# Patient Record
Sex: Female | Born: 1980 | Race: Black or African American | Hispanic: No | Marital: Single | Smoking: Current some day smoker
Health system: Southern US, Community
[De-identification: ages and names within clinical notes are randomized; demographics above are authoritative.]

## PROBLEM LIST (undated history)

## (undated) DIAGNOSIS — C801 Malignant (primary) neoplasm, unspecified: Secondary | ICD-10-CM

## (undated) DIAGNOSIS — R569 Unspecified convulsions: Secondary | ICD-10-CM

---

## 2016-09-18 DIAGNOSIS — C719 Malignant neoplasm of brain, unspecified: Secondary | ICD-10-CM | POA: Diagnosis present

## 2018-11-20 DIAGNOSIS — F411 Generalized anxiety disorder: Secondary | ICD-10-CM | POA: Diagnosis present

## 2020-07-17 ENCOUNTER — Encounter (HOSPITAL_COMMUNITY): Payer: Self-pay

## 2020-07-17 ENCOUNTER — Other Ambulatory Visit: Payer: Self-pay

## 2020-07-17 ENCOUNTER — Observation Stay (HOSPITAL_COMMUNITY)
Admission: EM | Admit: 2020-07-17 | Discharge: 2020-07-18 | Disposition: A | Discharge status: Home / Self Care | Payer: Medicaid Other | Attending: Family Medicine | Admitting: Family Medicine

## 2020-07-17 ENCOUNTER — Emergency Department (HOSPITAL_COMMUNITY): Payer: Medicaid Other

## 2020-07-17 DIAGNOSIS — R519 Headache, unspecified: Secondary | ICD-10-CM | POA: Diagnosis not present

## 2020-07-17 DIAGNOSIS — R739 Hyperglycemia, unspecified: Secondary | ICD-10-CM | POA: Insufficient documentation

## 2020-07-17 DIAGNOSIS — E876 Hypokalemia: Secondary | ICD-10-CM | POA: Diagnosis not present

## 2020-07-17 DIAGNOSIS — G936 Cerebral edema: Secondary | ICD-10-CM

## 2020-07-17 DIAGNOSIS — Z20822 Contact with and (suspected) exposure to covid-19: Secondary | ICD-10-CM | POA: Diagnosis not present

## 2020-07-17 DIAGNOSIS — C719 Malignant neoplasm of brain, unspecified: Secondary | ICD-10-CM | POA: Diagnosis present

## 2020-07-17 DIAGNOSIS — F411 Generalized anxiety disorder: Secondary | ICD-10-CM | POA: Diagnosis present

## 2020-07-17 HISTORY — DX: Malignant (primary) neoplasm, unspecified: C80.1

## 2020-07-17 LAB — BASIC METABOLIC PANEL
Anion gap: 8 (ref 5–15)
BUN: 8 mg/dL (ref 6–20)
CO2: 25 mmol/L (ref 22–32)
Calcium: 9.6 mg/dL (ref 8.9–10.3)
Chloride: 103 mmol/L (ref 98–111)
Creatinine, Ser: 0.85 mg/dL (ref 0.44–1.00)
GFR, Estimated: >60 mL/min (ref >60)
Glucose, Bld: 109 mg/dL — ABNORMAL HIGH (ref 70–99)
Potassium: 3.3 mmol/L — ABNORMAL LOW (ref 3.5–5.1)
Sodium: 136 mmol/L (ref 135–145)

## 2020-07-17 LAB — CBC WITH DIFFERENTIAL/PLATELET
Abs Immature Granulocytes: 0.01 10*3/uL (ref 0.00–0.07)
Basophils Absolute: 0 10*3/uL (ref 0.0–0.1)
Basophils Relative: 0 %
Eosinophils Absolute: 0 10*3/uL (ref 0.0–0.5)
Eosinophils Relative: 0 %
HCT: 41.5 % (ref 36.0–46.0)
Hemoglobin: 13.7 g/dL (ref 12.0–15.0)
Immature Granulocytes: 0 %
Lymphocytes Relative: 10 %
Lymphs Abs: 0.9 10*3/uL (ref 0.7–4.0)
MCH: 31.6 pg (ref 26.0–34.0)
MCHC: 33 g/dL (ref 30.0–36.0)
MCV: 95.6 fL (ref 80.0–100.0)
Monocytes Absolute: 0.3 10*3/uL (ref 0.1–1.0)
Monocytes Relative: 4 %
Neutro Abs: 7.2 10*3/uL (ref 1.7–7.7)
Neutrophils Relative %: 86 %
Platelets: 171 10*3/uL (ref 150–400)
RBC: 4.34 MIL/uL (ref 3.87–5.11)
RDW: 12.2 % (ref 11.5–15.5)
WBC: 8.4 10*3/uL (ref 4.0–10.5)
nRBC: 0 % (ref 0.0–0.2)

## 2020-07-17 LAB — RESP PANEL BY RT-PCR (FLU A&B, COVID) ARPGX2
Influenza A by PCR: NEGATIVE
Influenza B by PCR: NEGATIVE
SARS Coronavirus 2 by RT PCR: NEGATIVE

## 2020-07-17 LAB — MAGNESIUM: Magnesium: 1.8 mg/dL (ref 1.7–2.4)

## 2020-07-17 LAB — PREGNANCY, URINE: Preg Test, Ur: NEGATIVE

## 2020-07-17 MED ORDER — POLYETHYLENE GLYCOL 3350 17 G PO PACK
17.0000 g | PACK | Freq: Every day | ORAL | Status: DC
  Administered 2020-07-17: 17 g via ORAL
  Filled 2020-07-17: qty 1

## 2020-07-17 MED ORDER — HYDROMORPHONE HCL 1 MG/ML IJ SOLN
1.0000 mg | Freq: Once | INTRAMUSCULAR | Status: AC
  Administered 2020-07-17: 1 mg via INTRAVENOUS
  Filled 2020-07-17: qty 1

## 2020-07-17 MED ORDER — ONDANSETRON HCL 4 MG/2ML IJ SOLN
4.0000 mg | Freq: Once | INTRAMUSCULAR | Status: AC
  Administered 2020-07-17: 4 mg via INTRAVENOUS
  Filled 2020-07-17: qty 2

## 2020-07-17 MED ORDER — SODIUM CHLORIDE 0.9% FLUSH: 3.0000 mL | INTRAVENOUS | Status: DC | PRN

## 2020-07-17 MED ORDER — ONDANSETRON HCL 4 MG PO TABS: 4.0000 mg | ORAL_TABLET | Freq: Four times a day (QID) | ORAL | Status: DC | PRN

## 2020-07-17 MED ORDER — ACETAMINOPHEN 325 MG PO TABS
650.0000 mg | ORAL_TABLET | Freq: Four times a day (QID) | ORAL | Status: DC | PRN
  Administered 2020-07-17: 650 mg via ORAL
  Filled 2020-07-17: qty 2

## 2020-07-17 MED ORDER — PANTOPRAZOLE SODIUM 40 MG IV SOLR: 40.0000 mg | Freq: Once | INTRAVENOUS | Status: DC

## 2020-07-17 MED ORDER — POTASSIUM CHLORIDE 20 MEQ PO PACK
40.0000 meq | PACK | Freq: Once | ORAL | Status: AC
  Administered 2020-07-17: 40 meq via ORAL
  Filled 2020-07-17: qty 2

## 2020-07-17 MED ORDER — SODIUM CHLORIDE 0.9 % IV SOLN: 250.0000 mL | INTRAVENOUS | Status: DC | PRN

## 2020-07-17 MED ORDER — SODIUM CHLORIDE 0.9% FLUSH
3.0000 mL | Freq: Two times a day (BID) | INTRAVENOUS | Status: DC
  Administered 2020-07-17 (×2): 3 mL via INTRAVENOUS

## 2020-07-17 MED ORDER — HYDROMORPHONE HCL 2 MG/ML IJ SOLN
2.0000 mg | INTRAMUSCULAR | Status: DC | PRN
  Administered 2020-07-17: 2 mg via INTRAVENOUS
  Filled 2020-07-17: qty 1

## 2020-07-17 MED ORDER — ENOXAPARIN SODIUM 40 MG/0.4ML IJ SOSY
40.0000 mg | PREFILLED_SYRINGE | Freq: Every day | INTRAMUSCULAR | Status: DC
  Administered 2020-07-17: 40 mg via SUBCUTANEOUS
  Filled 2020-07-17: qty 0.4

## 2020-07-17 MED ORDER — PANTOPRAZOLE SODIUM 40 MG PO TBEC
40.0000 mg | DELAYED_RELEASE_TABLET | Freq: Every day | ORAL | Status: DC
  Administered 2020-07-17: 40 mg via ORAL
  Filled 2020-07-17: qty 1

## 2020-07-17 MED ORDER — ONDANSETRON HCL 4 MG/2ML IJ SOLN
4.0000 mg | Freq: Four times a day (QID) | INTRAMUSCULAR | Status: DC | PRN
  Administered 2020-07-17: 4 mg via INTRAVENOUS
  Filled 2020-07-17: qty 2

## 2020-07-17 MED ORDER — DEXAMETHASONE SODIUM PHOSPHATE 10 MG/ML IJ SOLN
10.0000 mg | Freq: Once | INTRAMUSCULAR | Status: AC
  Administered 2020-07-17: 10 mg via INTRAVENOUS
  Filled 2020-07-17: qty 1

## 2020-07-17 MED ORDER — METOCLOPRAMIDE HCL 5 MG/ML IJ SOLN: 10.0000 mg | Freq: Four times a day (QID) | INTRAMUSCULAR | Status: DC | PRN

## 2020-07-17 MED ORDER — MORPHINE SULFATE (PF) 4 MG/ML IV SOLN
4.0000 mg | Freq: Once | INTRAVENOUS | Status: AC
  Administered 2020-07-17: 4 mg via INTRAVENOUS
  Filled 2020-07-17: qty 1

## 2020-07-17 MED ORDER — DEXAMETHASONE 4 MG PO TABS
4.0000 mg | ORAL_TABLET | Freq: Three times a day (TID) | ORAL | Status: DC
  Administered 2020-07-17 – 2020-07-18 (×3): 4 mg via ORAL
  Filled 2020-07-17 (×3): qty 1

## 2020-07-17 NOTE — ED Provider Notes (Signed)
[facility] DEPT Provider Note   CSN: [id number] Arrival date & time: 07/17/20  0630     History Chief Complaint  Patient presents with   Headache    Paula Farmer is a 40 y.o. female.  Pt presents to the ED today with a headache and n/v.  Pt has a recurrent anaplastic astrocytoma.  She had a brain biopsy on 5/25.  NS did not recommend debulking surgery.  She followed up with her oncologist (Dr. Maylon Peppers) on 6/16 and was told to start on Temozolomide, but the pharmacy has not filled it yet.  She was prescribed vicodin and zofran, but she has not taken any today.  Pain is more on the right side of her head.  She said she also feels like she can't walk as well as she used to.  She has been taking her decadron.      Past Medical History:  Diagnosis Date   Cancer [facility])     Patient Active Problem List   Diagnosis Date Noted   Headache 07/17/2020   Intractable headache 07/17/2020   GAD (generalized anxiety disorder) 11/20/2018   Anaplastic astrocytoma ([place]) 09/18/2016     History reviewed. No pertinent surgical history.   OB History   No obstetric history on file.     No family history on file.  Social History   Tobacco Use   Smoking status: Unknown  Substance Use Topics   Drug use: Not Currently    Home Medications Prior to Admission medications   Not on File    Allergies    Patient has no known allergies.  Review of Systems   Review of Systems  Gastrointestinal:  Positive for nausea and vomiting.  Neurological:  Positive for headaches.  All other systems reviewed and are negative.  Physical Exam Updated Vital Signs BP 114/73   Pulse 61   Temp 98.3 F (36.8 C) (Oral)   Resp 16   Ht 5' (1.524 m)   Wt 49.9 kg   LMP 07/01/2020   SpO2 100%   BMI 21.48 kg/m   Physical Exam Vitals and nursing note reviewed.  Constitutional:      General: She is in acute distress.  HENT:     Head: Normocephalic and atraumatic.      Mouth/Throat:     Mouth: Mucous membranes are moist.     Pharynx: Oropharynx is clear.  Eyes:     Extraocular Movements: Extraocular movements intact.     Pupils: Pupils are equal, round, and reactive to light.  Cardiovascular:     Rate and Rhythm: Normal rate and regular rhythm.  Pulmonary:     Effort: Pulmonary effort is normal.     Breath sounds: Normal breath sounds.  Abdominal:     General: Bowel sounds are normal.     Palpations: Abdomen is soft.  Musculoskeletal:        General: Normal range of motion.     Cervical back: Normal range of motion and neck supple.  Skin:    General: Skin is warm.     Capillary Refill: Capillary refill takes less than 2 seconds.  Neurological:     Mental Status: She is alert and oriented to person, place, and time.     Comments: It is difficult to get a good exam because pt is rocking and hiding under a blanket due to the pain.  Psychiatric:        Mood and Affect: Mood normal.    ED  Results / Procedures / Treatments   Labs (all labs ordered are listed, but only abnormal results are displayed) Labs Reviewed  BASIC METABOLIC PANEL - Abnormal; Notable for the following components:      Result Value   Potassium 3.3 (*)    Glucose, Bld 109 (*)    All other components within normal limits  RESP PANEL BY RT-PCR (FLU A&B, COVID) [id number]  CBC WITH DIFFERENTIAL/PLATELET  PREGNANCY, URINE  MAGNESIUM    EKG None  Radiology CT Head Wo Contrast  Result Date: 07/17/2020 CLINICAL DATA:  History of anaplastic astrocytoma.  Headache. EXAM: CT HEAD WITHOUT CONTRAST TECHNIQUE: Contiguous axial images were obtained from the base of the skull through the vertex without intravenous contrast. COMPARISON:  None. FINDINGS: Brain: Large area of tumoral edema involving the right temporal and parietal lobes. There is mild mass effect on the right lateral ventricle with approximately 5 mm of right-to-left shift. No evidence of acute hemorrhage or downward  transtentorial herniation. The temporal horn of the right lateral ventricle is slightly enlarged. No extra-axial fluid collections. Brainstem and cerebellum are grossly normal. Vascular: No hyperdense vessel or unexpected calcification. Skull: No bone lesions or fractures. Sinuses/Orbits: The paranasal sinuses and mastoid air cells are clear. Other: No scalp lesions or hematoma. IMPRESSION: 1. Large area of tumoral edema involving the right temporal and parietal lobes with mild mass effect on the right lateral ventricle and approximately 5 mm of right-to-left shift. 2. No evidence of acute hemorrhage or downward transtentorial herniation. Electronically Signed   By: Marijo Sanes M.D.   On: 07/17/2020 08:34    Procedures Procedures   Medications Ordered in ED Medications  enoxaparin (LOVENOX) injection 40 mg (has no administration in time range)  sodium chloride flush (NS) 0.9 % injection 3 mL (has no administration in time range)  sodium chloride flush (NS) 0.9 % injection 3 mL (has no administration in time range)  0.9 %  sodium chloride infusion (has no administration in time range)  ondansetron (ZOFRAN) tablet 4 mg (has no administration in time range)    Or  ondansetron (ZOFRAN) injection 4 mg (has no administration in time range)  HYDROmorphone (DILAUDID) injection 2 mg (has no administration in time range)  pantoprazole (PROTONIX) EC tablet 40 mg (has no administration in time range)  potassium chloride (KLOR-CON) packet 40 mEq (has no administration in time range)  dexamethasone (DECADRON) tablet 4 mg (has no administration in time range)  polyethylene glycol (MIRALAX / GLYCOLAX) packet 17 g (has no administration in time range)  morphine 4 MG/ML injection 4 mg (4 mg Intravenous Given 07/17/20 0739)  ondansetron (ZOFRAN) injection 4 mg (4 mg Intravenous Given 07/17/20 0739)  HYDROmorphone (DILAUDID) injection 1 mg (1 mg Intravenous Given 07/17/20 0902)  dexamethasone (DECADRON) injection  10 mg (10 mg Intravenous Given 07/17/20 0934)    ED Course  I have reviewed the triage vital signs and the nursing notes.  Pertinent labs & imaging results that were available during my care of the patient were reviewed by me and considered in my medical decision making (see chart for details).    MDM Rules/Calculators/A&P                          Pt is still in a lot of pain after the morphine.  She was given dilaudid which has helped.  Pt is more comfortable.  She was d/w Dr. Maylon Peppers.  He recommends decadron 10 mg IV now  then decadron 4 mg TID  + PPI.  She does not need to go to [facility] for this.  He will arrange follow up for her in his office after hospital d/c.  He is going out of town this afternoon, but his partner (also neuro- oncology) will be available if her condition changes.  Pt d/w Dr. Si Raider ([facility]) for admission.  Final Clinical Impression(s) / ED Diagnoses Final diagnoses:  Vasogenic edema (HCC)  Anaplastic astrocytoma ([place])  Acute intractable headache, unspecified headache type    Rx / DC Orders ED Discharge Orders     None        Isla Pence, MD 07/17/20 1017

## 2020-07-17 NOTE — ED Triage Notes (Signed)
Pt also has nausea and vomiting at this time.

## 2020-07-17 NOTE — H&P (Signed)
History and Physical    Paula Farmer VEH:[id number] DOB: 1980/03/27 DOA: 07/17/2020  PCP: System, Provider Not In  Patient coming from: home   Chief Complaint: headache  HPI: Paula Farmer is a 40 y.o. female with medical history significant for anaplastic astrocytoma who presents with the above.  Hx of this malignancy treated with chemo and radiation in 2018. Recently began experiencing headache and vision change. Imaging shows recurrence or progression of disease. Underwent biopsy last month at [facility] (receives all her care there). Plan to start temozolamide but hasn't received it yet.   She was awoken from sleep early this morning with severe right sided headache. Also with nausea. No focal weakness/numbness.  ED Course:   Imaging shows large area of tumoral edema in right temporal and parietal lobes. EDP consulted patient's [facility] oncologist dr. Maylon Peppers who advised decadron IV and then 4 mg tid with plan for outpt f/u in his office which he will arrange.  Review of Systems: As per HPI otherwise 10 point review of systems negative.    Past Medical History:  Diagnosis Date   Cancer [facility])     History reviewed. No pertinent surgical history.   reports previous drug use. No history on file for tobacco use and alcohol use.  No Known Allergies  No family history on file.  Prior to Admission medications   Not on File    Physical Exam: Vitals:   07/17/20 0705 07/17/20 0730 07/17/20 0835 07/17/20 0900  BP: 115/75  124/63 114/73  Pulse: 62  83 61  Resp:   18 16  Temp:  98.3 F (36.8 C)    TempSrc:  Oral    SpO2: 95%  100% 100%  Weight:      Height:        Constitutional: appears in pain Head: Atraumatic Eyes: Conjunctiva clear ENM: Moist mucous membranes.   Neck: Supple Respiratory: Clear to auscultation bilaterally, no wheezing/rales/rhonchi. Normal respiratory effort. No accessory muscle use. . Cardiovascular: Regular rate and rhythm. No  murmurs/rubs/gallops. Abdomen: Non-tender, non-distended. No masses. No rebound or guarding. Positive bowel sounds. Musculoskeletal: No joint deformity upper and lower extremities. Normal ROM, no contractures. Normal muscle tone.  Skin: No rashes, lesions, or ulcers.  Extremities: No peripheral edema. Palpable peripheral pulses. Neurologic: Alert, moving all 4 extremities. Psychiatric: Normal insight and judgement.   Labs on Admission: I have personally reviewed following labs and imaging studies  CBC: Recent Labs  Lab 07/17/20 0744  WBC 8.4  NEUTROABS 7.2  HGB 13.7  HCT 41.5  MCV 95.6  PLT 836   Basic Metabolic Panel: Recent Labs  Lab 07/17/20 0744  NA 136  K 3.3*  CL 103  CO2 25  GLUCOSE 109*  BUN 8  CREATININE 0.85  CALCIUM 9.6   GFR: Estimated Creatinine Clearance: 64.5 mL/min (by C-G formula based on SCr of 0.85 mg/dL). Liver Function Tests: No results for input(s): AST, ALT, ALKPHOS, BILITOT, PROT, ALBUMIN in the last 168 hours. No results for input(s): LIPASE, AMYLASE in the last 168 hours. No results for input(s): AMMONIA in the last 168 hours. Coagulation Profile: No results for input(s): INR, PROTIME in the last 168 hours. Cardiac Enzymes: No results for input(s): CKTOTAL, CKMB, CKMBINDEX, TROPONINI in the last 168 hours. BNP (last 3 results) No results for input(s): PROBNP in the last 8760 hours. HbA1C: No results for input(s): HGBA1C in the last 72 hours. CBG: No results for input(s): GLUCAP in the last 168 hours. Lipid Profile: No results for  input(s): CHOL, HDL, LDLCALC, TRIG, CHOLHDL, LDLDIRECT in the last 72 hours. Thyroid Function Tests: No results for input(s): TSH, T4TOTAL, FREET4, T3FREE, THYROIDAB in the last 72 hours. Anemia Panel: No results for input(s): VITAMINB12, FOLATE, FERRITIN, TIBC, IRON, RETICCTPCT in the last 72 hours. Urine analysis: No results found for: COLORURINE, APPEARANCEUR, [place], [place], GLUCOSEU, HGBUR,  BILIRUBINUR, KETONESUR, PROTEINUR, UROBILINOGEN, NITRITE, LEUKOCYTESUR  Radiological Exams on Admission: CT Head Wo Contrast  Result Date: 07/17/2020 CLINICAL DATA:  History of anaplastic astrocytoma.  Headache. EXAM: CT HEAD WITHOUT CONTRAST TECHNIQUE: Contiguous axial images were obtained from the base of the skull through the vertex without intravenous contrast. COMPARISON:  None. FINDINGS: Brain: Large area of tumoral edema involving the right temporal and parietal lobes. There is mild mass effect on the right lateral ventricle with approximately 5 mm of right-to-left shift. No evidence of acute hemorrhage or downward transtentorial herniation. The temporal horn of the right lateral ventricle is slightly enlarged. No extra-axial fluid collections. Brainstem and cerebellum are grossly normal. Vascular: No hyperdense vessel or unexpected calcification. Skull: No bone lesions or fractures. Sinuses/Orbits: The paranasal sinuses and mastoid air cells are clear. Other: No scalp lesions or hematoma. IMPRESSION: 1. Large area of tumoral edema involving the right temporal and parietal lobes with mild mass effect on the right lateral ventricle and approximately 5 mm of right-to-left shift. 2. No evidence of acute hemorrhage or downward transtentorial herniation. Electronically Signed   By: Marijo Sanes M.D.   On: 07/17/2020 08:34      Assessment/Plan Active Problems:   Anaplastic astrocytoma (HCC)   GAD (generalized anxiety disorder)   Headache   Intractable headache   # Intractable headache # Anaplastic astrocytoma # Vasogenic edema Recent dx of recurrence or progression of known astrocytoma. Now with one day of severe debilitating headache. Dr. Maylon Peppers of [facility] advises glucocorticoids. No herniation seen on CT - has received dexamethasone 10 mg IV, will order 4 mg po tid - start PPI - zofran for nausea - dilaudid for pain (miralax ordered) - pt/ot consult - will be ready to d/c w/ close  oncology f/u when pain controlled and able to ambulate safely - seizure precautioins  # Hypokalemia Mild, 3.3, likely 2/2 nausea/vomiting - f/u mg - 40 meq oral ordered  DVT prophylaxis: lovenox Code Status: full  Family Communication: none @ bedside  Consults called: none   Level of care: Med-Surg     Desma Maxim MD [facility] Pager [phone]  If 7PM-7AM, please contact night-coverage [email or web address] Password [facility]  07/17/2020, 10:11 AM

## 2020-07-17 NOTE — ED Notes (Signed)
Patient transported to CT 

## 2020-07-17 NOTE — Evaluation (Signed)
Occupational Therapy Evaluation Patient Details Name: Ashton Sabine MRN: [id number] DOB: December 19, 1980 Today's Date: 07/17/2020    History of Present Illness Latese Dufault is a 40 y.o. 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  Hx of this malignancy treated with chemo and radiation in 2018. Recently began experiencing headache and vision change. Imaging shows recurrence or progression of disease. Underwent biopsy last month at [facility] (receives all her care there).  She was awoken from sleep early this morning with severe right sided headache. Also with nausea. No focal weakness/numbness. Found to have Large area of tumoral edema involving the right temporal and  parietal lobes with mild mass effect on the right lateral ventricle  and approximately 5 mm of right-to-left shift.   Clinical Impression   Ms. Lumi Winslett is a 40 year old woman who presents with impaired balance, inattention, and possible left visual field cut. Also ntoed to have right shoulder impairments and pain from recent surgery. Patient able to perform ADLs and functional mobility though somewhat unsafely and unsteadily - though no over loss of balance was noted. Patient will benefit from skilled OT services while in hospital to improve deficits and learn compensatory strategies as needed in order to return home safely. Do not expect patient will need OT services at discharge. Recommend patient follow up with  ortho for potential rehab on shoulder and f/u with ophthalmologist for formal assessment of vision.     Follow Up Recommendations  No OT follow up    Equipment Recommendations  None recommended by OT    Recommendations for Other Services Other (comment) (Ophthalmogist)     Precautions / Restrictions Precautions Precautions: Fall Restrictions Weight Bearing Restrictions: No      Mobility Bed Mobility Overal bed mobility: Modified  Independent                  Transfers Overall transfer level: Modified independent                    Balance Overall balance assessment: Mild deficits observed, not formally tested                                         ADL either performed or assessed with clinical judgement   ADL Overall ADL's : Modified independent                                             Vision Patient Visual Report: Eye fatigue/eye pain/headache;Nausea/blurring vision with head movement (intermittent complaints. Patient had difficulty describing symptoms.) Vision Assessment?: Yes Eye Alignment: Within Functional Limits Ocular Range of Motion: Within Functional Limits Visual Fields: Left visual field deficit (questionable left visual field cut) Diplopia Assessment: Other (comment) (One report of double vision during assessment) Additional Comments: Visual assessment limited due to patient's inattention.     Perception     Praxis      Pertinent Vitals/Pain Pain Assessment: Faces Faces Pain Scale: Hurts little more Pain Location: R sided headache Pain Descriptors / Indicators: Grimacing;Sharp Pain Intervention(s): Monitored during session     Hand Dominance Right   Extremity/Trunk Assessment Upper Extremity Assessment Upper Extremity Assessment: RUE deficits/detail;LUE deficits/detail RUE Deficits / Details: WFL ROM, pain and decreased strength in shoulder from prior  surgery in January. Tends to shoulder hike and guard arm. RUE Sensation: WNL RUE Coordination:  (requires increased time and attention to perform finger to thumb correctly.) LUE Deficits / Details: WNL ROM/ 5/5 strength. LUE Sensation: WNL LUE Coordination: WNL   Lower Extremity Assessment Lower Extremity Assessment: Defer to PT evaluation   Cervical / Trunk Assessment Cervical / Trunk Assessment: Normal   Communication Communication Communication: No difficulties    Cognition Arousal/Alertness: Awake/alert Behavior During Therapy: WFL for tasks assessed/performed Overall Cognitive Status: Within Functional Limits for tasks assessed                                 General Comments: Easily distracted, short attention span, inattention.   General Comments  Can walk without device but patient reports improved confidence with walker. Walks in high guard without walker. Slightly usnteady but no overt loss of balance. Able to resist anterior nudges.    Exercises     Shoulder Instructions      Home Living Family/patient expects to be discharged to:: Private residence Living Arrangements: Children;Spouse/significant other Available Help at Discharge: Family                         Home Equipment: None          Prior Functioning/Environment Level of Independence: Independent        Comments: Has been independent. Walks the dog outside.        OT Problem List: Impaired balance (sitting and/or standing);Decreased knowledge of use of DME or AE;Impaired vision/perception;Pain      OT Treatment/Interventions: Balance training;Patient/family education;Therapeutic activities;Neuromuscular education;Visual/perceptual remediation/compensation;DME and/or AE instruction    OT Goals(Current goals can be found in the care plan section) Acute Rehab OT Goals Patient Stated Goal: go home soon OT Goal Formulation: With patient Time For Goal Achievement: 07/31/20 Potential to Achieve Goals: Good  OT Frequency: Min 2X/week   Barriers to D/C:            Co-evaluation              AM-PAC OT "6 Clicks" Daily Activity     Outcome Measure Help from another person eating meals?: None Help from another person taking care of personal grooming?: None Help from another person toileting, which includes using toliet, bedpan, or urinal?: None Help from another person bathing (including washing, rinsing, drying)?: None Help from  another person to put on and taking off regular upper body clothing?: None Help from another person to put on and taking off regular lower body clothing?: None 6 Click Score: 24   End of Session Equipment Utilized During Treatment: Rolling walker Nurse Communication: Mobility status  Activity Tolerance: Patient tolerated treatment well Patient left: in bed;with call bell/phone within reach;with family/visitor present  OT Visit Diagnosis: Unsteadiness on feet (R26.81);Other symptoms and signs involving the nervous system (R29.898)                Time: 1521-1550 OT Time Calculation (min): 29 min Charges:  OT General Charges $OT Visit: 1 Visit OT Evaluation $OT Eval Moderate Complexity: 1 Mod  Earnstine Meinders, OTR/L [place]  Office [phone] Pager: [place] 07/17/2020, 4:38 PM

## 2020-07-17 NOTE — ED Triage Notes (Signed)
Pt to ED by EMS from home with c/o headache. Pt has a history of brain cancer. Rates pain 10/10. Arrives A+O, VSS, Pt in obvious pain.

## 2020-07-17 NOTE — ED Notes (Signed)
Pt is unable to remain still enough to obtain vs

## 2020-07-17 NOTE — ED Notes (Signed)
Pt reports being woken up by a throbbing headache.  Hx of brain CA.  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  She recently moved to [place].

## 2020-07-17 NOTE — Progress Notes (Signed)
5 Teacher, English as a foreign language called to say one of our pts was on their floor. This RN went downstairs and found pt and significant other coming from outside. Pt instructed of no smoking policy and that patients are not to leave the unit. Pt verbalized understanding and escorted back to room.

## 2020-07-18 DIAGNOSIS — G936 Cerebral edema: Secondary | ICD-10-CM

## 2020-07-18 LAB — HIV ANTIBODY (ROUTINE TESTING W REFLEX): HIV Screen 4th Generation wRfx: NONREACTIVE

## 2020-07-18 LAB — BASIC METABOLIC PANEL
Anion gap: 10 (ref 5–15)
BUN: 8 mg/dL (ref 6–20)
CO2: 25 mmol/L (ref 22–32)
Calcium: 10.4 mg/dL — ABNORMAL HIGH (ref 8.9–10.3)
Chloride: 101 mmol/L (ref 98–111)
Creatinine, Ser: 0.84 mg/dL (ref 0.44–1.00)
GFR, Estimated: >60 mL/min (ref >60)
Glucose, Bld: 132 mg/dL — ABNORMAL HIGH (ref 70–99)
Potassium: 4 mmol/L (ref 3.5–5.1)
Sodium: 136 mmol/L (ref 135–145)

## 2020-07-18 MED ORDER — DEXAMETHASONE 4 MG PO TABS: 4.0000 mg | ORAL_TABLET | Freq: Three times a day (TID) | ORAL | 0 refills | Status: AC

## 2020-07-18 MED ORDER — PANTOPRAZOLE SODIUM 40 MG PO TBEC: 40.0000 mg | DELAYED_RELEASE_TABLET | Freq: Every day | ORAL | 0 refills | Status: DC

## 2020-07-18 NOTE — Progress Notes (Signed)
PT Cancellation Note  Patient Details Name: Paula Farmer MRN: [id number] DOB: Nov 25, 1980   Cancelled Treatment:    Reason Eval/Treat Not Completed: PT screened, no needs identified, will sign off. Patient ambulatyed x 200', talked and walked and  did not show signs of any deficits.    Paula Farmer 07/18/2020, 9:12 AM [place] Pager [phone] Office [phone]

## 2020-07-18 NOTE — Progress Notes (Signed)
Occupational Therapy Treatment Patient Details Name: Paula Farmer MRN: [id number] DOB: 03-26-1980 Today's Date: 07/18/2020    History of present illness Paula Farmer is a 40 y.o. 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  Hx of this malignancy treated with chemo and radiation in 2018. Recently began experiencing headache and vision change. Imaging shows recurrence or progression of disease. Underwent biopsy last month at [facility] (receives all her care there).  She was awoken from sleep early this morning with severe right sided headache. Also with nausea. No focal weakness/numbness. Found to have Large area of tumoral edema involving the right temporal and  parietal lobes with mild mass effect on the right lateral ventricle  and approximately 5 mm of right-to-left shift.   OT comments  Patient exhibits improved attention and balance today compared to yesterday. Patient able to ambulate in hallway and negotiate around obstacles without loss of balance or complaints of dizziness today. Patient did not exhibit any overt visual deficits on the left today. Patient able to to attend to therapist and visually track without difficulty. Therapist still recommended patient follow up with eye doctor for formal evaluation. No further OT needs.    Follow Up Recommendations  No OT follow up    Equipment Recommendations  None recommended by OT    Recommendations for Other Services Other (comment)    Precautions / Restrictions Precautions Precautions: None       Mobility Bed Mobility Overal bed mobility: Independent                  Transfers Overall transfer level: Independent                    Balance Overall balance assessment: Modified Independent                                         ADL either performed or assessed with clinical judgement   ADL                                                Vision       Perception     Praxis      Cognition Arousal/Alertness: Awake/alert Behavior During Therapy: WFL for tasks assessed/performed Overall Cognitive Status: Within Functional Limits for tasks assessed                                 General Comments: improved attention today        Exercises     Shoulder Instructions       General Comments      Pertinent Vitals/ Pain       Pain Assessment: No/denies pain  Home Living                                          Prior Functioning/Environment              Frequency           Progress Toward Goals  OT Goals(current goals can now be found in the care  plan section)  Progress towards OT goals: Goals met/education completed, patient discharged from [place] goals met and education completed, patient discharged from OT services    Co-evaluation                 AM-PAC OT "6 Clicks" Daily Activity     Outcome Measure   Help from another person eating meals?: None Help from another person taking care of personal grooming?: None Help from another person toileting, which includes using toliet, bedpan, or urinal?: None Help from another person bathing (including washing, rinsing, drying)?: None Help from another person to put on and taking off regular upper body clothing?: None Help from another person to put on and taking off regular lower body clothing?: None 6 Click Score: 24    End of Session        Activity Tolerance Patient tolerated treatment well   Patient Left in bed;with call bell/phone within reach   Nurse Communication Mobility status        Time: 2178-3754 OT Time Calculation (min): 9 min  Charges: OT General Charges $OT Visit: 1 Visit OT Treatments $Therapeutic Activity: 8-22 mins  Derl Barrow, OTR/L [place]  Office [phone] Pager: [place] 07/18/2020, 11:07 AM

## 2020-07-18 NOTE — Plan of Care (Signed)
Pt way much better this am; AAOx3; smiling and joking! Ask about d/c; she slept well through the night; no s/s of acute distress or pain observed or reported; call light and cell phone within reach with bed at lowest position for safety. Nicotine patch offered but pt refused at this time.

## 2020-07-18 NOTE — Discharge Summary (Signed)
Physician Discharge Summary  Paula Farmer SWH:[id number] DOB: 1980/09/27 DOA: 07/17/2020  PCP: System, Provider Not In  Admit date: 07/17/2020 Discharge date: 07/18/2020  Time spent: 40 minutes  Recommendations for Outpatient Follow-up:  Follow outpatient CBC/CMP Follow with Dr. Maylon Peppers outpatient Follow blood sugars with PCP outpatient   Discharge Diagnoses:  Active Problems:   Anaplastic astrocytoma (HCC)   GAD (generalized anxiety disorder)   Headache   Intractable headache   Discharge Condition: stable  Diet recommendation: heart healthy  Filed Weights   07/17/20 0704  Weight: 49.9 kg    History of present illness:  Paula Farmer is Paula Farmer 40 y.o. 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   Hx of this malignancy treated with chemo and radiation in 2018. Recently began experiencing headache and vision change. Imaging shows recurrence or progression of disease. Underwent biopsy last month at [facility] (receives all her care there). Plan to start temozolamide but hasn't received it yet.   She was awoken from sleep early this morning with severe right sided headache. Also with nausea. No focal weakness/numbness.  Imaging showed Tinisha Etzkorn large area of tumoral edema involving right temporal and parietal lobes with mild mass effect on the right lateral ventricle and approximately 5 mm of right to left shift (no evidence of acute hemorrhage or downward transtentorial herniation).  Case was discussed with pt's neurooncologist who recommended steroids and outpatient follow up when stable.    Discharged 6/21 with improved symptoms.  See below for additional details    Hospital Course:  # Intractable headache # Anaplastic astrocytoma # Vasogenic edema Recent dx of recurrence or progression of known astrocytoma. Now with one day of severe debilitating headache. Dr. Maylon Peppers of [facility] advises glucocorticoids. No herniation seen  on CT -  symptoms improved - discussed case with Dr. Lorette Ang partner this AM, ok with discharge on steroids/PPI with close follow up - continue dexamethasone 4 mg TID at discharge with follow up with neurooncology - continue PPI   # Hyperglycemia - mild, likely 2/2 steroids - follow with PCP outpatient - no hx diabetes  # Hypercalcemia - mild, may correct as 6/16 albumin was 4.6. Follow outpatient.  # Hypokalemia Mild, 3.3, likely 2/2 nausea/vomiting - f/u mg - 40 meq oral ordered    Procedures: none  Consultations: Neuro oncology over phone [facility]  Discharge Exam: Vitals:   07/17/20 2037 07/18/20 0636  BP: (!) 104/52 (!) 118/55  Pulse: (!) 53 63  Resp: 17 17  Temp: 98.4 F (36.9 C) 98.2 F (36.8 C)  SpO2: 99% 100%   No new complaints Feeling better Eager for discharge home  General: No acute distress. Cardiovascular: Heart sounds show Susie Ehresman regular rate, and rhythm.  Lungs: Clear to auscultation bilaterally  Abdomen: Soft, nontender, nondistended Neurological: Alert and oriented 3. Moves all extremities 4 with equal strength. Cranial nerves II through XII grossly intact. Skin: Warm and dry. No rashes or lesions. Extremities: No clubbing or cyanosis. No edema.   Discharge Instructions   Discharge Instructions     Call MD for:  difficulty breathing, headache or visual disturbances   Complete by: As directed    Call MD for:  extreme fatigue   Complete by: As directed    Call MD for:  hives   Complete by: As directed    Call MD for:  persistant dizziness or light-headedness   Complete by: As directed    Call MD for:  persistant nausea and vomiting  Complete by: As directed    Call MD for:  redness, tenderness, or signs of infection (pain, swelling, redness, odor or green/yellow discharge around incision site)   Complete by: As directed    Call MD for:  severe uncontrolled pain   Complete by: As directed    Call MD for:  temperature >100.4   Complete by: As  directed    Diet - low sodium heart healthy   Complete by: As directed    Discharge instructions   Complete by: As directed    You were seen for headache.  This was related to your anaplastic astrocytoma and swelling related to that.   You've improved with steroids.  Continue the dexamethasone three times Makiyah Zentz day for now.  Schedule Ethelda Deangelo follow up with Dr. Maylon Peppers for Gaven Eugene follow up appointment.  Continue the steroids at this current dose until instructed to change by Dr. Maylon Peppers.  Please follow up with your PCP.  Your blood sugars maybe high on the steroids, but that should improve with tapering.  You may need medications temporarily for high blood sugars while you're on these high steroid doses.  Return for new, recurrent, or worsening symptoms.  Please ask your PCP to request records from this hospitalization so they know what was done and what the next steps will be.   Increase activity slowly   Complete by: As directed       Allergies as of 07/18/2020   No Known Allergies      Medication List     TAKE these medications    dexamethasone 4 MG tablet Commonly known as: DECADRON Take 1 tablet (4 mg total) by mouth every 8 (eight) hours for 14 days. Please follow up with Dr. Maylon Peppers for instructions regarding Hedy Garro taper What changed:  medication strength how much to take when to take this additional instructions   HM MULTIVITAMIN ADULT GUMMY PO Take 2 each by mouth daily.   HYDROcodone-acetaminophen 5-325 MG tablet Commonly known as: NORCO/VICODIN Take 1-2 tablets by mouth every 6 (six) hours as needed for pain.   methocarbamol 500 MG tablet Commonly known as: ROBAXIN Take 500 mg by mouth 3 (three) times daily as needed for spasms.   pantoprazole 40 MG tablet Commonly known as: PROTONIX Take 1 tablet (40 mg total) by mouth daily.       No Known Allergies    The results of significant diagnostics from this hospitalization (including imaging, microbiology, ancillary and  laboratory) are listed below for reference.    Significant Diagnostic Studies: CT Head Wo Contrast  Result Date: 07/17/2020 CLINICAL DATA:  History of anaplastic astrocytoma.  Headache. EXAM: CT HEAD WITHOUT CONTRAST TECHNIQUE: Contiguous axial images were obtained from the base of the skull through the vertex without intravenous contrast. COMPARISON:  None. FINDINGS: Brain: Large area of tumoral edema involving the right temporal and parietal lobes. There is mild mass effect on the right lateral ventricle with approximately 5 mm of right-to-left shift. No evidence of acute hemorrhage or downward transtentorial herniation. The temporal horn of the right lateral ventricle is slightly enlarged. No extra-axial fluid collections. Brainstem and cerebellum are grossly normal. Vascular: No hyperdense vessel or unexpected calcification. Skull: No bone lesions or fractures. Sinuses/Orbits: The paranasal sinuses and mastoid air cells are clear. Other: No scalp lesions or hematoma. IMPRESSION: 1. Large area of tumoral edema involving the right temporal and parietal lobes with mild mass effect on the right lateral ventricle and approximately 5 mm of right-to-left shift. 2. No  evidence of acute hemorrhage or downward transtentorial herniation. Electronically Signed   By: Marijo Sanes M.D.   On: 07/17/2020 08:34    Microbiology: Recent Results (from the past 240 hour(s))  Resp Panel by RT-PCR (Flu Jakeline Dave&B, Covid) Nasopharyngeal Swab     Status: None   Collection Time: 07/17/20  9:39 AM   Specimen: Nasopharyngeal Swab; Nasopharyngeal(NP) swabs in vial transport medium  Result Value Ref Range Status   SARS Coronavirus 2 by RT PCR NEGATIVE NEGATIVE Final    Comment: (NOTE) SARS-CoV-2 target nucleic acids are NOT DETECTED.  The SARS-CoV-2 RNA is generally detectable in upper respiratory specimens during the acute phase of infection. The lowest concentration of SARS-CoV-2 viral copies this assay can detect is 138  copies/mL. Lacheryl Niesen negative result does not preclude SARS-Cov-2 infection and should not be used as the sole basis for treatment or other patient management decisions. Faron Whitelock negative result may occur with  improper specimen collection/handling, submission of specimen other than nasopharyngeal swab, presence of viral mutation(s) within the areas targeted by this assay, and inadequate number of viral copies(<138 copies/mL). Lorrin Nawrot negative result must be combined with clinical observations, patient history, and epidemiological information. The expected result is Negative.  Fact Sheet for Patients:  EntrepreneurPulse.com.au  Fact Sheet for Healthcare Providers:  IncredibleEmployment.be  This test is no t yet approved or cleared by the [place] FDA and  has been authorized for detection and/or diagnosis of SARS-CoV-2 by FDA under an Emergency Use Authorization (EUA). This EUA will remain  in effect (meaning this test can be used) for the duration of the COVID-19 declaration under Section 564(b)(1) of the Act, 21 U.S.C.section 360bbb-3(b)(1), unless the authorization is terminated  or revoked sooner.       Influenza Aadhira Heffernan by PCR NEGATIVE NEGATIVE Final   Influenza B by PCR NEGATIVE NEGATIVE Final    Comment: (NOTE) The Xpert Xpress SARS-CoV-2/FLU/RSV plus assay is intended as an aid in the diagnosis of influenza from Nasopharyngeal swab specimens and should not be used as Vasilisa Vore sole basis for treatment. Nasal washings and aspirates are unacceptable for Xpert Xpress SARS-CoV-2/FLU/RSV testing.  Fact Sheet for Patients: EntrepreneurPulse.com.au  Fact Sheet for Healthcare Providers: IncredibleEmployment.be  This test is not yet approved or cleared by the [place] FDA and has been authorized for detection and/or diagnosis of SARS-CoV-2 by FDA under an Emergency Use Authorization (EUA). This EUA will remain in effect (meaning  this test can be used) for the duration of the COVID-19 declaration under Section 564(b)(1) of the Act, 21 U.S.C. section 360bbb-3(b)(1), unless the authorization is terminated or revoked.  Performed at [facility], [place] [address], [place], [place] [address]      Labs: Basic Metabolic Panel: Recent Labs  Lab 07/17/20 0744 07/17/20 1206 07/18/20 0622  NA 136  --  136  K 3.3*  --  4.0  CL 103  --  101  CO2 25  --  25  GLUCOSE 109*  --  132*  BUN 8  --  8  CREATININE 0.85  --  0.84  CALCIUM 9.6  --  10.4*  MG  --  1.8  --    Liver Function Tests: No results for input(s): AST, ALT, ALKPHOS, BILITOT, PROT, ALBUMIN in the last 168 hours. No results for input(s): LIPASE, AMYLASE in the last 168 hours. No results for input(s): AMMONIA in the last 168 hours. CBC: Recent Labs  Lab 07/17/20 0744  WBC 8.4  NEUTROABS 7.2  HGB 13.7  HCT 41.5  MCV 95.6  PLT 171   Cardiac Enzymes: No results for input(s): CKTOTAL, CKMB, CKMBINDEX, TROPONINI in the last 168 hours. BNP: BNP (last 3 results) No results for input(s): BNP in the last 8760 hours.  ProBNP (last 3 results) No results for input(s): PROBNP in the last 8760 hours.  CBG: No results for input(s): GLUCAP in the last 168 hours.     Signed:  Fayrene Helper MD.  [facility] 07/18/2020, 10:13 AM

## 2020-11-05 ENCOUNTER — Emergency Department (HOSPITAL_COMMUNITY)
Admission: EM | Admit: 2020-11-05 | Discharge: 2020-11-05 | Disposition: A | Discharge status: Home / Self Care | Payer: Medicaid Other | Attending: Emergency Medicine | Admitting: Emergency Medicine

## 2020-11-05 ENCOUNTER — Encounter (HOSPITAL_COMMUNITY): Payer: Self-pay | Admitting: Emergency Medicine

## 2020-11-05 ENCOUNTER — Other Ambulatory Visit: Payer: Self-pay

## 2020-11-05 ENCOUNTER — Emergency Department (HOSPITAL_COMMUNITY): Payer: Medicaid Other

## 2020-11-05 DIAGNOSIS — R519 Headache, unspecified: Secondary | ICD-10-CM | POA: Diagnosis present

## 2020-11-05 DIAGNOSIS — C719 Malignant neoplasm of brain, unspecified: Secondary | ICD-10-CM | POA: Insufficient documentation

## 2020-11-05 DIAGNOSIS — F17210 Nicotine dependence, cigarettes, uncomplicated: Secondary | ICD-10-CM | POA: Insufficient documentation

## 2020-11-05 DIAGNOSIS — Z859 Personal history of malignant neoplasm, unspecified: Secondary | ICD-10-CM | POA: Insufficient documentation

## 2020-11-05 LAB — CBC WITH DIFFERENTIAL/PLATELET
Abs Immature Granulocytes: 0.04 10*3/uL (ref 0.00–0.07)
Basophils Absolute: 0 10*3/uL (ref 0.0–0.1)
Basophils Relative: 0 %
Eosinophils Absolute: 0 10*3/uL (ref 0.0–0.5)
Eosinophils Relative: 0 %
HCT: 39.8 % (ref 36.0–46.0)
Hemoglobin: 12.8 g/dL (ref 12.0–15.0)
Immature Granulocytes: 0 %
Lymphocytes Relative: 17 %
Lymphs Abs: 1.6 10*3/uL (ref 0.7–4.0)
MCH: 31.6 pg (ref 26.0–34.0)
MCHC: 32.2 g/dL (ref 30.0–36.0)
MCV: 98.3 fL (ref 80.0–100.0)
Monocytes Absolute: 0.6 10*3/uL (ref 0.1–1.0)
Monocytes Relative: 6 %
Neutro Abs: 7 10*3/uL (ref 1.7–7.7)
Neutrophils Relative %: 77 %
Platelets: 226 10*3/uL (ref 150–400)
RBC: 4.05 MIL/uL (ref 3.87–5.11)
RDW: 12.5 % (ref 11.5–15.5)
WBC: 9.3 10*3/uL (ref 4.0–10.5)
nRBC: 0 % (ref 0.0–0.2)

## 2020-11-05 LAB — COMPREHENSIVE METABOLIC PANEL
ALT: 15 U/L (ref 0–44)
AST: 20 U/L (ref 15–41)
Albumin: 3.9 g/dL (ref 3.5–5.0)
Alkaline Phosphatase: 44 U/L (ref 38–126)
Anion gap: 7 (ref 5–15)
BUN: <5 mg/dL — ABNORMAL LOW (ref 6–20)
CO2: 30 mmol/L (ref 22–32)
Calcium: 9.3 mg/dL (ref 8.9–10.3)
Chloride: 99 mmol/L (ref 98–111)
Creatinine, Ser: 0.72 mg/dL (ref 0.44–1.00)
GFR, Estimated: >60 mL/min (ref >60)
Glucose, Bld: 80 mg/dL (ref 70–99)
Potassium: 3.7 mmol/L (ref 3.5–5.1)
Sodium: 136 mmol/L (ref 135–145)
Total Bilirubin: 0.3 mg/dL (ref 0.3–1.2)
Total Protein: 6.5 g/dL (ref 6.5–8.1)

## 2020-11-05 MED ORDER — FENTANYL CITRATE PF 50 MCG/ML IJ SOSY
PREFILLED_SYRINGE | INTRAMUSCULAR | Status: AC
  Administered 2020-11-05: 100 ug via INTRAMUSCULAR
  Filled 2020-11-05: qty 2

## 2020-11-05 MED ORDER — FENTANYL CITRATE PF 50 MCG/ML IJ SOSY
100.0000 ug | PREFILLED_SYRINGE | Freq: Once | INTRAMUSCULAR | Status: AC
Start: 2020-11-05 — End: 2020-11-05
  Filled 2020-11-05: qty 2

## 2020-11-05 MED ORDER — LORAZEPAM 1 MG PO TABS: 1.0000 mg | ORAL_TABLET | Freq: Three times a day (TID) | ORAL | 0 refills | Status: DC | PRN

## 2020-11-05 MED ORDER — DIPHENHYDRAMINE HCL 50 MG/ML IJ SOLN
12.5000 mg | Freq: Once | INTRAMUSCULAR | Status: AC
  Administered 2020-11-05: 12.5 mg via INTRAVENOUS
  Filled 2020-11-05: qty 1

## 2020-11-05 MED ORDER — DEXAMETHASONE SODIUM PHOSPHATE 10 MG/ML IJ SOLN
10.0000 mg | Freq: Once | INTRAMUSCULAR | Status: AC
  Administered 2020-11-05: 10 mg via INTRAVENOUS
  Filled 2020-11-05: qty 1

## 2020-11-05 MED ORDER — HYDROMORPHONE HCL 1 MG/ML IJ SOLN
1.0000 mg | Freq: Once | INTRAMUSCULAR | Status: AC
  Administered 2020-11-05: 1 mg via INTRAVENOUS
  Filled 2020-11-05: qty 1

## 2020-11-05 MED ORDER — KETOROLAC TROMETHAMINE 30 MG/ML IJ SOLN
15.0000 mg | Freq: Once | INTRAMUSCULAR | Status: AC
  Administered 2020-11-05: 15 mg via INTRAVENOUS
  Filled 2020-11-05: qty 1

## 2020-11-05 MED ORDER — HYDROCODONE-ACETAMINOPHEN 5-325 MG PO TABS: 1.0000 | ORAL_TABLET | ORAL | 0 refills | Status: DC | PRN

## 2020-11-05 MED ORDER — ONDANSETRON 4 MG PO TBDP
8.0000 mg | ORAL_TABLET | Freq: Once | ORAL | Status: AC
  Administered 2020-11-05: 8 mg via ORAL
  Filled 2020-11-05: qty 2

## 2020-11-05 NOTE — ED Notes (Signed)
I went to  get med  pt was taken to c-t before I could return to give the med

## 2020-11-05 NOTE — ED Provider Notes (Signed)
Emergency Medicine Provider Triage Evaluation Note  Paula Farmer , a 40 y.o. female  was evaluated in triage.  Pt complains of right-sided headache that she describes as sharp of sudden onset about an hour prior to arrival.  Has not taken any medication for improvement in symptoms.  She does have a prior history of anaplastic astrocytoma under the care of [facility] feeling dizzy, lightheaded, numbness to feet and hands which has now passed.  Review of Systems  Positive: Headache Negative: Nausea, vomiting  Physical Exam  BP (!) 106/43 (BP Location: Left Arm)   Pulse 85   Temp 98.1 F (36.7 C) (Oral)   Resp 20   SpO2 100%  Gen:   Awake, no distress   Resp:  Normal effort  MSK:   Moves extremities without difficulty  Other:  Neuro exam is benign, follows commands appropriately.  Medical Decision Making  Medically screening exam initiated at 2:03 PM.  Appropriate orders placed.  Paula Farmer was informed that the remainder of the evaluation will be completed by another provider, this initial triage assessment does not replace that evaluation, and the importance of remaining in the ED until their evaluation is complete.  History of brain tumor with sudden onset of headache prior to arrival in the ED.  No vision changes, no nausea or vomiting, no trauma.   Paula Fitting, PA-C 11/05/20 1404    Paula Muskrat, MD 11/05/20 [phone]

## 2020-11-05 NOTE — ED Triage Notes (Signed)
Pt to triage via [facility] from home.  States she has a R sided brain tumor that her doctor states is getting larger.  Reports tremors all over, diarrhea, palpitations, and tingling all over today.  Reports R sided head pain.

## 2020-11-05 NOTE — ED Notes (Signed)
Medication discarded by another rn by mistake  will need another order to give the med

## 2020-11-05 NOTE — ED Notes (Signed)
Pt d/c home per MD order. Discharge summary reviewed, pt verbalizes understanding. Reports pt daughter is her discharge ride home. No s/s of acute distress noted at discharge.

## 2020-11-05 NOTE — ED Notes (Signed)
The pt reports that her headache is better but not easing off

## 2020-11-05 NOTE — ED Notes (Signed)
The pt  does not want ot answer questions  she reported that she has brain cancer and has headaches and one now  hypertventilation episode earlier today with dizziness and body numbness and tingling  lmp  sept 24th

## 2020-11-05 NOTE — ED Provider Notes (Signed)
[facility] EMERGENCY DEPARTMENT Provider Note   CSN: [id number] Arrival date & time: 11/05/20  1349     History No chief complaint on file.   Paula Farmer is a 40 y.o. female.  HPI She reports sudden onset of bilateral headache, preceded by ringing in her left ear and tingling in her left hands.  No prior similar problems.  She states she has had intracranial biopsies that showed a brain tumor.  She is being followed by oncology at [facility].  She denies blurred vision, difficulty walking, nausea, vomiting, chest pain or shortness of breath.  There are no other known active modifying factors.    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 No pertinent surgical history.   OB History   No obstetric history on file.     No family history on file.  Social History   Tobacco Use   Smoking status: Every Day    Packs/day: 0.25    Years: 10.00    Pack years: 2.50    Types: Cigarettes   Smokeless tobacco: Never  Vaping Use   Vaping Use: Never used  Substance Use Topics   Alcohol use: Yes    Alcohol/week: 2.0 standard drinks    Types: 2 Glasses of wine per week    Comment: drinks wine twice a month   Drug use: Not Currently    Home Medications Prior to Admission medications   Medication Sig Start Date End Date Taking? Authorizing Provider  HYDROcodone-acetaminophen (NORCO) 5-325 MG tablet Take 1-2 tablets by mouth every 4 (four) hours as needed. 11/05/20  Yes Daleen Bo, MD  LORazepam (ATIVAN) 1 MG tablet Take 1 tablet (1 mg total) by mouth 3 (three) times daily as needed for anxiety. 11/05/20  Yes Daleen Bo, MD  methocarbamol (ROBAXIN) 500 MG tablet Take 500 mg  by mouth 3 (three) times daily as needed for spasms. 06/22/20   [provider]  Multiple Vitamins-Minerals (HM MULTIVITAMIN ADULT GUMMY PO) Take 2 each by mouth daily.    [provider]  pantoprazole (PROTONIX) 40 MG tablet Take 1 tablet (40 mg total) by mouth daily. 07/18/20 08/17/20  Elodia Florence., MD    Allergies    Patient has no known allergies.  Review of Systems   Review of Systems  All other systems reviewed and are negative.  Physical Exam Updated Vital Signs BP 107/64   Pulse 65   Temp 97.6 F (36.4 C) (Oral)   Resp 16   SpO2 95%   Physical Exam Vitals and nursing note reviewed.  Constitutional:      General: She is not in acute distress.    Appearance: She is well-developed. She is not ill-appearing, toxic-appearing or diaphoretic.  HENT:     Head: Normocephalic and atraumatic.     Comments: No tenderness of the scalp or cranium.  No visible surgical wounds of the scalp.    Right Ear: External ear normal. There is no impacted cerumen.     Left Ear: External ear normal.  Eyes:     Conjunctiva/sclera: Conjunctivae normal.     Pupils: Pupils are equal, round, and reactive to light.  Neck:  Trachea: Phonation normal.  Cardiovascular:     Rate and Rhythm: Normal rate and regular rhythm.     Heart sounds: Normal heart sounds.  Pulmonary:     Effort: Pulmonary effort is normal.     Breath sounds: Normal breath sounds.  Abdominal:     Palpations: Abdomen is soft.     Tenderness: There is no abdominal tenderness.  Musculoskeletal:        General: Normal range of motion.     Cervical back: Normal range of motion and neck supple.  Skin:    General: Skin is warm and dry.  Neurological:     Mental Status: She is alert and oriented to person, place, and time.     Cranial Nerves: No cranial nerve deficit.     Sensory: No sensory deficit.     Motor: No abnormal muscle tone.     Coordination: Coordination normal.     Comments: No  dysarthria, aphasia or nystagmus.  Normal strength, arms legs bilaterally.  Psychiatric:        Mood and Affect: Mood normal.        Behavior: Behavior normal.        Thought Content: Thought content normal.        Judgment: Judgment normal.    ED Results / Procedures / Treatments   Labs (all labs ordered are listed, but only abnormal results are displayed) Labs Reviewed  COMPREHENSIVE METABOLIC PANEL - Abnormal; Notable for the following components:      Result Value   BUN <5 (*)    All other components within normal limits  CBC WITH DIFFERENTIAL/PLATELET    EKG None  Radiology CT HEAD WO CONTRAST (5MM)  Result Date: 11/05/2020 CLINICAL DATA:  Brain mass or lesion. Additional history provided: Right-sided brain tumor, tremors, diarrhea, palpitations, tingling all over. Right-sided head pain. Per review of prior radiology records, the patient has a history of anaplastic astrocytoma. EXAM: CT HEAD WITHOUT CONTRAST TECHNIQUE: Contiguous axial images were obtained from the base of the skull through the vertex without intravenous contrast. COMPARISON:  Head CT 07/17/2020. FINDINGS: Brain: Again demonstrated is a large region of abnormal hypodensity within the right cerebral hemisphere, predominantly within the white matter, likely reflecting a combination of underlying tumor and associated vasogenic edema. There is involvement of the right frontal, parietal, occipital and temporal lobes, as well as right insula, right thalamus, right internal external capsules, and possibly also the right basal ganglia. The overall extent may be slightly increased as compared to the prior head CT. An apparent small cystic/necrotic foot appears more conspicuous. Mass effect has not significant changed with leftward midline shift again measuring 4-5 mm at the level of the foramen magnum. Unchanged partial effacement of the right lateral ventricle. No evidence of complicating hemorrhage. Vascular: No hyperdense  vessel. Skull: Redemonstrated right-sided burr hole/craniotomy. No calvarial fracture. Sinuses/Orbits: Visualized orbits show no acute finding. No significant paranasal sinus disease at the imaged levels. IMPRESSION: Extensive abnormal hypodensity within the right cerebral hemisphere, predominantly within the white matter, as described and likely reflecting a combination of tumor and associated vasogenic edema. This may be slightly increased in extent as compared to the prior head CT of 07/17/2020. Mass effect has not significantly changed with unchanged 4-5 mm leftward midline shift measured at the level of the septum pellucidum. As before, there is partial effacement of the right lateral ventricle. A small apparent cystic/necrotic focus within the right temporal lobe is slightly more conspicuous. No evidence of complicating  hemorrhage. Electronically Signed   By: Kellie Simmering D.O.   On: 11/05/2020 16:35    Procedures Procedures   Medications Ordered in ED Medications  fentaNYL (SUBLIMAZE) injection 100 mcg (100 mcg Intramuscular Given 11/05/20 1633)  ondansetron (ZOFRAN-ODT) disintegrating tablet 8 mg (8 mg Oral Given 11/05/20 1632)  dexamethasone (DECADRON) injection 10 mg (10 mg Intravenous Given 11/05/20 1919)  HYDROmorphone (DILAUDID) injection 1 mg (1 mg Intravenous Given 11/05/20 1922)  ketorolac (TORADOL) 30 MG/ML injection 15 mg (15 mg Intravenous Given 11/05/20 1924)  diphenhydrAMINE (BENADRYL) injection 12.5 mg (12.5 mg Intravenous Given 11/05/20 1921)    ED Course  I have reviewed the triage vital signs and the nursing notes.  Pertinent labs & imaging results that were available during my care of the patient were reviewed by me and considered in my medical decision making (see chart for details).  Clinical Course as of 11/06/20 1650  Sun Nov 05, 2020  1851 She continues to have severe headache.  CT head shows minor progression of right-sided tumor with some vasogenic edema.  Patient  states she is continue to take Decadron, twice a day. [EW]    Clinical Course User Index [EW] Daleen Bo, MD   MDM Rules/Calculators/A&P                            Patient Vitals for the past 24 hrs:  BP Temp Temp src Pulse Resp SpO2  11/05/20 2031 -- 97.6 F (36.4 C) Oral -- -- --  11/05/20 1930 107/64 -- -- 65 16 95 %  11/05/20 1800 121/71 -- -- (!) 59 16 100 %  11/05/20 1740 121/73 -- -- 63 16 100 %  11/05/20 1700 (!) 125/95 -- -- 61 18 100 %    8:19 PM Reevaluation with update and discussion. After initial assessment and treatment, an updated evaluation reveals she is feeling better now.  She states she has a telemedicine visit with the oncology service tomorrow.  Findings discussed and questions answered. Daleen Bo   Medical Decision Making:  This patient is presenting for evaluation of headache, with history of brain tumor, which does require a range of treatment options, and is a complaint that involves a moderate risk of morbidity and mortality. The differential diagnoses include nonspecific headache, complications from cancer, migraine headache. I decided to review old records, and in summary middle-aged female presenting for evaluation of headache that started abruptly.  She has history of astrocytoma, currently being evaluated and managed by oncology.  She is followed closely.  Is currently taking Decadron, for intracerebral edema..  I did not require additional historical information from anyone.  Clinical Laboratory Tests Ordered, included CBC and Metabolic panel. Review indicates normal findings. Radiologic Tests Ordered, included CT head.  I independently Visualized: Radiographic images, which show right-sided brain tumor, with slight progression relative to prior imaging    Critical Interventions-clinical evaluation, laboratory testing, CT imaging, observation and reassessment  After These Interventions, the Patient was reevaluated and was found presenting  with nonspecific headache.  She has a history of anaplastic astrocytoma, with right-sided tumor.  She has been treated with oral chemotherapy agents.  She has been on dexamethasone in the past.  She was last seen by oncology 3 weeks ago.  Headache was acute in onset, no suspect intracranial bleeding or acute abnormalities after evaluation.  Doubt seizure.  Stable for discharge with outpatient management.  She has an outpatient appointment scheduled for next day by  telemedicine.  CRITICAL CARE-no Performed by: Daleen Bo  Nursing Notes Reviewed/ Care Coordinated Applicable Imaging Reviewed Interpretation of Laboratory Data incorporated into ED treatment  The patient appears reasonably screened and/or stabilized for discharge and I doubt any other medical condition or other [facility] requiring further screening, evaluation, or treatment in the ED at this time prior to discharge.  Plan: Home Medications-continue current; Home Treatments-rest, fluids; return here if the recommended treatment, does not improve the symptoms; Recommended follow up-oncology follow-up for further management as scheduled.     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

## 2020-11-05 NOTE — Discharge Instructions (Addendum)
We are giving a prescription for narcotic pain reliever.  Do not drive or drink alcohol when taking it.  Discussed the visit today and the recommended treatment, when you talk to your oncology provider tomorrow by telemedicine.

## 2021-03-14 ENCOUNTER — Encounter (HOSPITAL_COMMUNITY): Payer: Self-pay | Admitting: *Deleted

## 2021-03-14 ENCOUNTER — Emergency Department (HOSPITAL_COMMUNITY)
Admission: EM | Admit: 2021-03-14 | Discharge: 2021-03-15 | Disposition: A | Discharge status: Nursing Home (Includes ICF) | Payer: Medicaid Other | Attending: Emergency Medicine | Admitting: Emergency Medicine

## 2021-03-14 ENCOUNTER — Emergency Department (HOSPITAL_COMMUNITY): Payer: Medicaid Other

## 2021-03-14 ENCOUNTER — Other Ambulatory Visit: Payer: Self-pay

## 2021-03-14 DIAGNOSIS — Z20822 Contact with and (suspected) exposure to covid-19: Secondary | ICD-10-CM | POA: Insufficient documentation

## 2021-03-14 DIAGNOSIS — Z85841 Personal history of malignant neoplasm of brain: Secondary | ICD-10-CM | POA: Insufficient documentation

## 2021-03-14 DIAGNOSIS — C719 Malignant neoplasm of brain, unspecified: Secondary | ICD-10-CM | POA: Diagnosis not present

## 2021-03-14 DIAGNOSIS — G936 Cerebral edema: Secondary | ICD-10-CM | POA: Insufficient documentation

## 2021-03-14 DIAGNOSIS — R519 Headache, unspecified: Secondary | ICD-10-CM | POA: Diagnosis present

## 2021-03-14 DIAGNOSIS — Z79899 Other long term (current) drug therapy: Secondary | ICD-10-CM | POA: Insufficient documentation

## 2021-03-14 DIAGNOSIS — N9489 Other specified conditions associated with female genital organs and menstrual cycle: Secondary | ICD-10-CM | POA: Diagnosis not present

## 2021-03-14 LAB — COMPREHENSIVE METABOLIC PANEL
ALT: 16 U/L (ref 0–44)
AST: 26 U/L (ref 15–41)
Albumin: 4.3 g/dL (ref 3.5–5.0)
Alkaline Phosphatase: 39 U/L (ref 38–126)
Anion gap: 9 (ref 5–15)
BUN: 12 mg/dL (ref 6–20)
CO2: 24 mmol/L (ref 22–32)
Calcium: 9 mg/dL (ref 8.9–10.3)
Chloride: 104 mmol/L (ref 98–111)
Creatinine, Ser: 0.79 mg/dL (ref 0.44–1.00)
GFR, Estimated: >60 mL/min (ref >60)
Glucose, Bld: 97 mg/dL (ref 70–99)
Potassium: 4.1 mmol/L (ref 3.5–5.1)
Sodium: 137 mmol/L (ref 135–145)
Total Bilirubin: 0.8 mg/dL (ref 0.3–1.2)
Total Protein: 7.3 g/dL (ref 6.5–8.1)

## 2021-03-14 LAB — DIFFERENTIAL
Abs Immature Granulocytes: 0.01 10*3/uL (ref 0.00–0.07)
Basophils Absolute: 0 10*3/uL (ref 0.0–0.1)
Basophils Relative: 0 %
Eosinophils Absolute: 0 10*3/uL (ref 0.0–0.5)
Eosinophils Relative: 1 %
Immature Granulocytes: 0 %
Lymphocytes Relative: 23 %
Lymphs Abs: 1.4 10*3/uL (ref 0.7–4.0)
Monocytes Absolute: 0.6 10*3/uL (ref 0.1–1.0)
Monocytes Relative: 9 %
Neutro Abs: 4.2 10*3/uL (ref 1.7–7.7)
Neutrophils Relative %: 67 %

## 2021-03-14 LAB — CBC
HCT: 40.1 % (ref 36.0–46.0)
Hemoglobin: 13.1 g/dL (ref 12.0–15.0)
MCH: 33.7 pg (ref 26.0–34.0)
MCHC: 32.7 g/dL (ref 30.0–36.0)
MCV: 103.1 fL — ABNORMAL HIGH (ref 80.0–100.0)
Platelets: 235 10*3/uL (ref 150–400)
RBC: 3.89 MIL/uL (ref 3.87–5.11)
RDW: 12.6 % (ref 11.5–15.5)
WBC: 6.3 10*3/uL (ref 4.0–10.5)
nRBC: 0 % (ref 0.0–0.2)

## 2021-03-14 LAB — APTT: aPTT: 30 seconds (ref 24–36)

## 2021-03-14 LAB — HCG, QUANTITATIVE, PREGNANCY: hCG, Beta Chain, Quant, S: <1 m[IU]/mL (ref <5)

## 2021-03-14 LAB — PROTIME-INR
INR: 0.9 (ref 0.8–1.2)
Prothrombin Time: 12.1 seconds (ref 11.4–15.2)

## 2021-03-14 MED ORDER — ONDANSETRON HCL 4 MG/2ML IJ SOLN
4.0000 mg | Freq: Once | INTRAMUSCULAR | Status: AC
  Administered 2021-03-14: 4 mg via INTRAVENOUS
  Filled 2021-03-14: qty 2

## 2021-03-14 MED ORDER — LORAZEPAM 2 MG/ML IJ SOLN
1.0000 mg | Freq: Once | INTRAMUSCULAR | Status: AC
  Administered 2021-03-14: 1 mg via INTRAVENOUS
  Filled 2021-03-14: qty 1

## 2021-03-14 MED ORDER — ONDANSETRON 4 MG PO TBDP: 4.0000 mg | ORAL_TABLET | Freq: Once | ORAL | Status: DC

## 2021-03-14 NOTE — ED Triage Notes (Signed)
Pt arrives via [facility] from home. Stage 3 brain cancer. Pt was  Laying in bed, left leg "went numb", and having tongue numbness. Has had this happen prior, supposed to have MRI. Chemo pills q 6 weeks. 122/80, hr 84. 100 SpO2, cbg 90.

## 2021-03-14 NOTE — ED Provider Notes (Addendum)
[facility] DEPT Provider Note   CSN: [id number] Arrival date & time: 03/14/21  2137     History  Chief Complaint  Patient presents with   Numbness    Paula Farmer is a 41 y.o. female.  The history is provided by the patient and medical records.   41 y.o. 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 Maylon Peppers at [facility], presenting to the ED with headache that began about 1 hour PTA.  Headache mostly right sided which is typical for her due to her tumor location.  States shooting pains down left side along with paresthesias.  No focal numbness or weakness, remains ambulatory.  She has been having some vomiting as well.  She is currently on oral chemotherapy (Lomustine), last dose about 6 weeks ago.   She is still on daily decadron-- 4mg  in AM, 1mg  in PM.  Home Medications Prior to Admission medications   Medication Sig Start Date End Date Taking? Authorizing Provider  HYDROcodone-acetaminophen (NORCO) 5-325 MG tablet Take 1-2 tablets by mouth every 4 (four) hours as needed. 11/05/20   Daleen Bo, MD  LORazepam (ATIVAN) 1 MG tablet Take 1 tablet (1 mg total) by mouth 3 (three) times daily as needed for anxiety. 11/05/20   Daleen Bo, MD  methocarbamol (ROBAXIN) 500 MG tablet Take 500 mg by mouth 3 (three) times daily as needed for spasms. 06/22/20   [provider]  Multiple Vitamins-Minerals (HM MULTIVITAMIN ADULT GUMMY PO) Take 2 each by mouth daily.    [provider]  pantoprazole (PROTONIX) 40 MG tablet Take 1 tablet (40 mg total) by mouth daily. 07/18/20 08/17/20  Elodia Florence., MD      Allergies    Patient has no known allergies.    Review of Systems   Review of Systems  Neurological:  Positive for headaches.  All other systems reviewed and are negative.  Physical Exam Updated Vital Signs BP 130/76    Pulse 72    Temp 97.9 F (36.6 C) (Oral)    Resp 20    Ht 5\' 6"  (1.676 m)    Wt 62.6  kg    SpO2 99%    BMI 22.27 kg/m   Physical Exam Vitals and nursing note reviewed.  Constitutional:      Appearance: She is well-developed.  HENT:     Head: Normocephalic and atraumatic.  Eyes:     Conjunctiva/sclera: Conjunctivae normal.     Pupils: Pupils are equal, round, and reactive to light.  Cardiovascular:     Rate and Rhythm: Normal rate and regular rhythm.     Heart sounds: Normal heart sounds.  Pulmonary:     Effort: Pulmonary effort is normal. No respiratory distress.     Breath sounds: Normal breath sounds. No rhonchi.  Abdominal:     General: Bowel sounds are normal.     Palpations: Abdomen is soft.  Musculoskeletal:        General: Normal range of motion.     Cervical back: Normal range of motion.  Skin:    General: Skin is warm and dry.  Neurological:     Comments: Sleeping during assessment (post ativan for CT)    ED Results / Procedures / Treatments   Labs (all labs ordered are listed, but only abnormal results are displayed) Labs Reviewed  CBC - Abnormal; Notable for the following components:      Result Value   MCV 103.1 (*)    All  other components within normal limits  RESP PANEL BY RT-PCR (FLU A&B, COVID) ARPGX2  PROTIME-INR  APTT  DIFFERENTIAL  COMPREHENSIVE METABOLIC PANEL  HCG, QUANTITATIVE, PREGNANCY  RAPID URINE DRUG SCREEN, HOSP PERFORMED    EKG EKG Interpretation  Date/Time:  Wednesday March 14 2021 21:52:56 EST Ventricular Rate:  90 PR Interval:  125 QRS Duration: 68 QT Interval:  342 QTC Calculation: 419 R Axis:   68 Text Interpretation: Sinus rhythm Probable left atrial enlargement Baseline wander in lead(s) V6 Otherwise within normal limits No old tracing to compare Confirmed by Delora Fuel ([id number]) on 03/15/2021 12:27:23 AM  Radiology CT HEAD WO CONTRAST  Result Date: 03/14/2021 CLINICAL DATA:  Brain cancer headache EXAM: CT HEAD WITHOUT CONTRAST TECHNIQUE: Contiguous axial images were obtained from the base of the  skull through the vertex without intravenous contrast. RADIATION DOSE REDUCTION: This exam was performed according to the departmental dose-optimization program which includes automated exposure control, adjustment of the mA and/or kV according to patient size and/or use of iterative reconstruction technique. COMPARISON:  CT 11/05/2020, 07/17/2020 FINDINGS: Brain: No hemorrhage is visualized. Redemonstrated extensive edema within the right hemisphere with mass effect on right lateral ventricle. Approximate 5 mm midline shift to the left compared with 5 mm previously. Low-density cystic area or necrotic area at the right temporal lobe redemonstrated. Vascular: No hyperdense vessels.  No unexpected calcification. Skull: Right-sided burr hole.  No fracture Sinuses/Orbits: No acute finding. Other: None IMPRESSION: 1. Redemonstrated extensive right cerebral hemisphere edema with similar 5 mm midline shift to the left and mass effect on the right lateral ventricle. Extent of edema is probably not significantly changed as compared with CT from October. There is no hemorrhage identified. Electronically Signed   By: Donavan Foil M.D.   On: 03/14/2021 23:51    Procedures Procedures    Medications Ordered in ED Medications  dexamethasone (DECADRON) injection 10 mg (has no administration in time range)  LORazepam (ATIVAN) injection 1 mg (1 mg Intravenous Given 03/14/21 2306)  ondansetron (ZOFRAN) injection 4 mg (4 mg Intravenous Given 03/14/21 2306)    ED Course/ Medical Decision Making/ A&P                           Medical Decision Making Amount and/or Complexity of Data Reviewed External Data Reviewed: labs, radiology, ECG and notes. Labs: ordered. Radiology: ordered and independent interpretation performed. ECG/medicine tests: ordered and independent interpretation performed.  Risk Prescription drug management. Decision regarding hospitalization.   41 y.o. F here with headache, nausea, vomiting  since about 1 hour PTA.  She has known recurrent anaplastic astrocytoma, followed by Dr. Maylon Peppers at [facility].  No oral chemo in about 6 weeks, still on daily decadron.   No focal deficits noted by provider in triage.  Went to evaluate patient and she has been taken for head CT.  Awaiting results along with labs.  1:04 AM Taken for CT but unfortunately had anxiety attack and began dry heaving in the scanner.  Asking for meds so she can tolerate CT.  Given ativan and zofran, will try to complete scan now.  1:04 AM Able to get CT-- reviewed and shows extensive cerebral edema, 64mm midline shift to the left and mass effect on the right lateral ventricle.  Similar in comparison to CT from October 2022, however no midline shift noted on MRI done 01/04/21.   On re-check, patient now sleeping after ativan.  No further vomiting per daughter at  bedside.  Will discuss with her physician at [facility].  In the past it appears she has required courses of decadron.  1:04 AM Spoke with Dr. Jimmy Footman at [facility] on call for heme/onc-- recommends as she is increasingly symptomatic will need updated MRI and transfer to [facility].  They do not have beds available at this moment so recommends admission here for now and bed likely available in the morning for her to transfer to.  Can give IV decadron now if continuing to have headache.  1:04 AM Updated patient and daughter at bedside--she is more awake and alert now.  Still has some headache.  We will go ahead and give dose of IV Decadron now.  They are aware of plan for MRI and transfer in the morning.  1:15 AM Discussed with Dr. Bridgett Larsson-- does not feel the patient needs admission here, rather can wait for bed availability in the ER.  He will see in the ED in consult.  I have ordered her MRI to be done in the morning.  Final Clinical Impression(s) / ED Diagnoses Final diagnoses:  Cerebral edema [facility])    Rx / DC Orders ED Discharge Orders     None         Larene Pickett,  PA-C 03/15/21 0325    Larene Pickett, PA-C 03/15/21 0325    Luna Fuse, MD 03/25/21 1253

## 2021-03-14 NOTE — ED Notes (Signed)
Patient transported to CT 

## 2021-03-14 NOTE — ED Triage Notes (Addendum)
Pt says she started having numbness and tingling in the left side of her body ( left arm and left leg) and tongue. Tongue numbness has subsided, still having numbness in the left hand and foot. Nausea. Feels like her heart is racing. Taking chemo pills. MRI scheduled for April. Pt says the sensation in her leg "hurts and burns"

## 2021-03-14 NOTE — ED Provider Triage Note (Signed)
Emergency Medicine Provider Triage Evaluation Note  Paula Farmer , a 41 y.o. female  was evaluated in triage.  Pt  with history of anaplastic astrocytoma who presents with worsening right-sided headache and sudden onset left-sided shooting electrical type pains down her left leg as well as intermittent paresthesias down the left leg.  Last dose of oral chemotherapy approximately 6 weeks ago.  Sudden onset approximate 1 hour prior to my initial evaluation.  Review of Systems  Positive: Headache, paresthesias, neuropathic pain down the left leg Negative: Blurry or double vision, nausea, vomiting, diarrhea  Physical Exam  BP 130/64 (BP Location: Right Arm)    Pulse 88    Temp 97.9 F (36.6 C) (Oral)    Resp 19    Ht 5\' 6"  (1.676 m)    Wt 62.6 kg    SpO2 99%    BMI 22.27 kg/m  Gen:   Awake, no distress   Resp:  Normal effort  MSK:   Moves extremities without difficulty  Other:  PERRL, EOMI, symmetric strength and sensation upper and lower extremities bilaterally.  Normal finger-nose testing.  Medical Decision Making  Medically screening exam initiated at 10:11 PM.  Appropriate orders placed.  [name] was informed that the remainder of the evaluation will be completed by another provider, this initial triage assessment does not replace that evaluation, and the importance of remaining in the ED until their evaluation is complete.  No code stroke activation at this time given reassuring neurologic exam  This chart was dictated using voice recognition software, Dragon. Despite the best efforts of this provider to proofread and correct errors, errors may still occur which can change documentation meaning.    [name], PA-C 03/14/21 2212

## 2021-03-15 ENCOUNTER — Emergency Department (HOSPITAL_COMMUNITY): Payer: Medicaid Other

## 2021-03-15 ENCOUNTER — Encounter (HOSPITAL_COMMUNITY): Payer: Self-pay

## 2021-03-15 DIAGNOSIS — G936 Cerebral edema: Secondary | ICD-10-CM

## 2021-03-15 DIAGNOSIS — C719 Malignant neoplasm of brain, unspecified: Secondary | ICD-10-CM

## 2021-03-15 LAB — RESP PANEL BY RT-PCR (FLU A&B, COVID) ARPGX2
Influenza A by PCR: NEGATIVE
Influenza B by PCR: NEGATIVE
SARS Coronavirus 2 by RT PCR: NEGATIVE

## 2021-03-15 LAB — RAPID URINE DRUG SCREEN, HOSP PERFORMED
Amphetamines: NOT DETECTED
Barbiturates: NOT DETECTED
Benzodiazepines: NOT DETECTED
Cocaine: NOT DETECTED
Opiates: NOT DETECTED
Tetrahydrocannabinol: POSITIVE — AB

## 2021-03-15 MED ORDER — HYDROMORPHONE HCL 1 MG/ML IJ SOLN
0.5000 mg | Freq: Once | INTRAMUSCULAR | Status: AC
  Administered 2021-03-15: 0.5 mg via INTRAVENOUS
  Filled 2021-03-15: qty 1

## 2021-03-15 MED ORDER — DEXAMETHASONE SODIUM PHOSPHATE 10 MG/ML IJ SOLN
8.0000 mg | Freq: Two times a day (BID) | INTRAMUSCULAR | Status: DC
Start: 2021-03-15 — End: 2021-03-16
  Administered 2021-03-15: 8 mg via INTRAVENOUS
  Filled 2021-03-15: qty 1

## 2021-03-15 MED ORDER — LORAZEPAM 2 MG/ML IJ SOLN
1.0000 mg | Freq: Once | INTRAMUSCULAR | Status: AC | PRN
  Administered 2021-03-15: 1 mg via INTRAVENOUS
  Filled 2021-03-15: qty 1

## 2021-03-15 MED ORDER — HYDROMORPHONE HCL 1 MG/ML IJ SOLN
0.5000 mg | INTRAMUSCULAR | Status: DC | PRN
  Administered 2021-03-15 (×2): 0.5 mg via INTRAVENOUS
  Filled 2021-03-15 (×2): qty 1

## 2021-03-15 MED ORDER — GADOBUTROL 1 MMOL/ML IV SOLN
6.0000 mL | Freq: Once | INTRAVENOUS | Status: AC | PRN
  Administered 2021-03-15: 6 mL via INTRAVENOUS

## 2021-03-15 MED ORDER — DEXAMETHASONE SODIUM PHOSPHATE 10 MG/ML IJ SOLN
10.0000 mg | Freq: Once | INTRAMUSCULAR | Status: AC
  Administered 2021-03-15: 10 mg via INTRAVENOUS
  Filled 2021-03-15: qty 1

## 2021-03-15 MED ORDER — ONDANSETRON HCL 4 MG/2ML IJ SOLN
4.0000 mg | Freq: Four times a day (QID) | INTRAMUSCULAR | Status: DC | PRN
  Administered 2021-03-15: 4 mg via INTRAVENOUS
  Filled 2021-03-15: qty 2

## 2021-03-15 MED ORDER — LACTATED RINGERS IV SOLN: INTRAVENOUS | Status: DC

## 2021-03-15 NOTE — ED Notes (Signed)
Resting quietly. No distress. Awaiting bed at [facility]

## 2021-03-15 NOTE — Assessment & Plan Note (Addendum)
All of the patient's oncologic care has been performed at [facility].  She is still seeing them and being actively managed by oncology at [facility].  The best plan for the patient would be for her to return to [facility] under the oncology service for further management of her anaplastic astrocytoma with vasogenic edema and midline shift.  At [facility], she can see her oncologist in the neurosurgeon who originally performed her biopsy in case there are any other neurosurgical issues that need to be addressed.  discussed the case with EDP.  Patient remains on the wait list to be transferred to [facility] tomorrow when beds available.  MRI of the brain should be performed as requested by on-call oncology at [facility].  Initial 10 mg IV Decadron given in the ER.  Continue her Decadron at 8 mg IV twice daily.  Continue with as needed Zofran.  We will add IV Dilaudid as needed for pain.

## 2021-03-15 NOTE — ED Notes (Signed)
PT ambulated to restroom. States she is feeling better than when she came in.

## 2021-03-15 NOTE — Consult Note (Signed)
Hospitalist Consultation History and Physical    Dorethy Tomey JKD:[id number] DOB: [birth year] DOA: 03/14/2021  DOS: the patient was seen and examined on 03/14/2021  PCP: [facility]   Patient coming from: Home  I have personally briefly reviewed patient's old medical records in [facility]  Chief complaint is headache, left arm numbness History of present illness: 41 year old African-American female with a history of anaplastic astrocytoma since 2018.  She was diagnosed and treated at [facility] has had all of her oncologic care performed there.  Patient developed a headache that started today.  It was associated with some nausea.  Headache was quite intense.  She had some perceived numbness of her left arm and her left leg.  She was brought to the ER today by by ambulance.  Her 36 year old daughter is at her bedside.  Patient has been sedated with some Ativan and is difficult to obtain history from her.  Admitting vital signs in the ER temp 97.9 heart rate 88 blood pressure 130/64  Lab work is unremarkable with a normal chemistry, normal CBC, normal coags  COVID-negative, influenza negative.  CT head demonstrates a right cerebral hemisphere mass with edema with a 5 mm midline shift to the left and mass effect on the right lateral ventricle.  There is no hemorrhage identified.  EDP is discussed the case with Dr. Jimmy Footman, on-call oncologist at [facility].  Patient is awaiting a bed at [facility] for transfer for continuing oncologic care.  IV Decadron and a brain MRI have been ordered per request from on-call oncology at [facility].   ED Course: labs unremarkable.  CT head shows extensive right cerebral hemisphere edema with a 5 mm midline shift to the left and mass effect on the right lateral ventricle.  Review of Systems:  Review of Systems  Constitutional:  Positive for malaise/fatigue.  HENT: Negative.    Eyes: Negative.   Respiratory:  Negative.    Cardiovascular: Negative.   Gastrointestinal:  Positive for nausea.  Genitourinary: Negative.   Musculoskeletal: Negative.   Skin: Negative.   Neurological:  Positive for dizziness, tingling and headaches.  Endo/Heme/Allergies: Negative.   Psychiatric/Behavioral: Negative.    All other systems reviewed and are negative.  Past Medical History:  Diagnosis Date   Cancer [facility])     History reviewed. No pertinent surgical history.   reports that she has been smoking cigarettes. She has a 2.50 pack-year smoking history. She has never used smokeless tobacco. She reports current alcohol use of about 2.0 standard drinks per week. She reports that she does not currently use drugs.  No Known Allergies  No family history on file.  Prior to Admission medications   Medication Sig Start Date End Date Taking? Authorizing Provider  HYDROcodone-acetaminophen (NORCO) 5-325 MG tablet Take 1-2 tablets by mouth every 4 (four) hours as needed. 11/05/20   Daleen Bo, MD  LORazepam (ATIVAN) 1 MG tablet Take 1 tablet (1 mg total) by mouth 3 (three) times daily as needed for anxiety. 11/05/20   Daleen Bo, MD  methocarbamol (ROBAXIN) 500 MG tablet Take 500 mg by mouth 3 (three) times daily as needed for spasms. 06/22/20   [provider]  Multiple Vitamins-Minerals (HM MULTIVITAMIN ADULT GUMMY PO) Take 2 each by mouth daily.    [provider]  pantoprazole (PROTONIX) 40 MG tablet Take 1 tablet (40 mg total) by mouth daily. 07/18/20 08/17/20  Elodia Florence., MD    Physical Exam: Vitals:  03/15/21 0015 03/15/21 0030 03/15/21 0100 03/15/21 0130  BP:  127/69 130/76 127/76  Pulse:  80 72 73  Resp: (!) 25 (!) 25 20 20   Temp:      TempSrc:      SpO2:  97% 99% 100%  Weight:      Height:        Physical Exam Vitals and nursing note reviewed.  Constitutional:      General: She is not in acute distress.    Appearance: She is not toxic-appearing or diaphoretic.   HENT:     Head: Normocephalic and atraumatic.  Eyes:     Pupils: Pupils are equal, round, and reactive to light.  Cardiovascular:     Rate and Rhythm: Normal rate and regular rhythm.     Pulses: Normal pulses.  Pulmonary:     Effort: Pulmonary effort is normal. No respiratory distress.     Breath sounds: No wheezing or rales.  Abdominal:     General: Bowel sounds are normal. There is no distension.     Tenderness: There is no abdominal tenderness. There is no guarding.  Musculoskeletal:     Right lower leg: No edema.     Left lower leg: No edema.  Skin:    General: Skin is warm and dry.  Neurological:     Comments: Sedated but arousable     Labs on Admission: I have personally reviewed following labs and imaging studies  CBC: Recent Labs  Lab 03/14/21 2255  WBC 6.3  NEUTROABS 4.2  HGB 13.1  HCT 40.1  MCV 103.1*  PLT 161   Basic Metabolic Panel: Recent Labs  Lab 03/14/21 2255  NA 137  K 4.1  CL 104  CO2 24  GLUCOSE 97  BUN 12  CREATININE 0.79  CALCIUM 9.0   GFR: Estimated Creatinine Clearance: 88.4 mL/min (by C-G formula based on SCr of 0.79 mg/dL). Liver Function Tests: Recent Labs  Lab 03/14/21 2255  AST 26  ALT 16  ALKPHOS 39  BILITOT 0.8  PROT 7.3  ALBUMIN 4.3   No results for input(s): LIPASE, AMYLASE in the last 168 hours. No results for input(s): AMMONIA in the last 168 hours. Coagulation Profile: Recent Labs  Lab 03/14/21 2255  INR 0.9   Cardiac Enzymes: No results for input(s): CKTOTAL, CKMB, CKMBINDEX, TROPONINI in the last 168 hours. BNP (last 3 results) No results for input(s): PROBNP in the last 8760 hours. HbA1C: No results for input(s): HGBA1C in the last 72 hours. CBG: No results for input(s): GLUCAP in the last 168 hours. Lipid Profile: No results for input(s): CHOL, HDL, LDLCALC, TRIG, CHOLHDL, LDLDIRECT in the last 72 hours. Thyroid Function Tests: No results for input(s): TSH, T4TOTAL, FREET4, T3FREE, THYROIDAB in  the last 72 hours. Anemia Panel: No results for input(s): VITAMINB12, FOLATE, FERRITIN, TIBC, IRON, RETICCTPCT in the last 72 hours. Urine analysis: No results found for: COLORURINE, APPEARANCEUR, LABSPEC, [place], GLUCOSEU, [place], BILIRUBINUR, KETONESUR, PROTEINUR, UROBILINOGEN, NITRITE, LEUKOCYTESUR  Radiological Exams on Admission: I have personally reviewed images CT HEAD WO CONTRAST  Result Date: 03/14/2021 CLINICAL DATA:  Brain cancer headache EXAM: CT HEAD WITHOUT CONTRAST TECHNIQUE: Contiguous axial images were obtained from the base of the skull through the vertex without intravenous contrast. RADIATION DOSE REDUCTION: This exam was performed according to the departmental dose-optimization program which includes automated exposure control, adjustment of the mA and/or kV according to patient size and/or use of iterative reconstruction technique. COMPARISON:  CT 11/05/2020, 07/17/2020 FINDINGS: Brain: No  hemorrhage is visualized. Redemonstrated extensive edema within the right hemisphere with mass effect on right lateral ventricle. Approximate 5 mm midline shift to the left compared with 5 mm previously. Low-density cystic area or necrotic area at the right temporal lobe redemonstrated. Vascular: No hyperdense vessels.  No unexpected calcification. Skull: Right-sided burr hole.  No fracture Sinuses/Orbits: No acute finding. Other: None IMPRESSION: 1. Redemonstrated extensive right cerebral hemisphere edema with similar 5 mm midline shift to the left and mass effect on the right lateral ventricle. Extent of edema is probably not significantly changed as compared with CT from October. There is no hemorrhage identified. Electronically Signed   By: Donavan Foil M.D.   On: 03/14/2021 23:51    EKG: I have personally reviewed EKG: NSR    Assessment/Plan Active Problems:   Anaplastic astrocytoma (HCC)   Vasogenic edema (HCC)    Assessment and Plan: Vasogenic edema ([place])- (present on  admission) Patient has 5 mm midline shift associated with her vasogenic edema.  This was not described on her most recent MRI at [facility] in December 2022.  EDP will need to coordinate with radiology to make sure that her CT head and her brain MRI are both burned onto a disc that can be transferred with the patient to [facility] to be uploaded to the PACS system.  Anaplastic astrocytoma ([place])- (present on admission) All of the patient's oncologic care has been performed at [facility].  She is still seeing them and being actively managed by oncology at [facility].  The best plan for the patient would be for her to return to [facility] under the oncology service for further management of her anaplastic astrocytoma with vasogenic edema and midline shift.  At [facility], she can see her oncologist in the neurosurgeon who originally performed her biopsy in case there are any other neurosurgical issues that need to be addressed.  discussed the case with EDP.  Patient remains on the wait list to be transferred to [facility] tomorrow when beds available.  MRI of the brain should be performed as requested by on-call oncology at [facility].  Initial 10 mg IV Decadron given in the ER.  Continue her Decadron at 8 mg IV twice daily.  Continue with as needed Zofran.  We will add IV Dilaudid as needed for pain.    Code Status: Full Code Family Communication: discussed with pt and her dtr at bedside  Disposition Plan: transfer to [facility] for continued oncologic care.    Kristopher Oppenheim, DO [facility] 03/15/2021, 1:50 AM

## 2021-03-15 NOTE — ED Notes (Signed)
[facility] called with bed assignment C-631. Number given for report [phone], secondary to charge: [phone].

## 2021-03-15 NOTE — ED Provider Notes (Signed)
Care handoff received from Quincy Carnes, PA-C at shift change please see previous provider note for full details of visit.  In short patient with a history of anaplastic astrocytoma treated [facility] presented with headache last night.  CT scan showed cerebral edema and 5 mm of midline shift.  Previous provider spoke with Dr. Jimmy Footman at [facility] and they requested an MRI be performed and are planning to transfer to their facility once a bed is available likely this morning.  IV Decadron given for headache.  Patient was also seen and evaluated by hospitalist here who did not feel patient needed admission and can wait in the ER for bed availability at [place]. Physical Exam  BP 119/64    Pulse 98    Temp 97.9 F (36.6 C) (Oral)    Resp 16    Ht 5\' 6"  (1.676 m)    Wt 62.6 kg    SpO2 96%    BMI 22.27 kg/m   Physical Exam Constitutional:      General: She is not in acute distress.    Appearance: Normal appearance. She is well-developed. She is not ill-appearing or diaphoretic.  HENT:     Head: Normocephalic and atraumatic.  Eyes:     General: Vision grossly intact. Gaze aligned appropriately.     Pupils: Pupils are equal, round, and reactive to light.  Neck:     Trachea: Trachea and phonation normal.  Pulmonary:     Effort: Pulmonary effort is normal. No respiratory distress.  Musculoskeletal:        General: Normal range of motion.     Cervical back: Normal range of motion.  Skin:    General: Skin is warm and dry.  Neurological:     Mental Status: She is alert.     GCS: GCS eye subscore is 4. GCS verbal subscore is 5. GCS motor subscore is 6.     Comments: Speech is clear and goal oriented, follows commands Major Cranial nerves without deficit, no facial droop Moves extremities without ataxia, coordination intact  Psychiatric:        Behavior: Behavior normal.    Procedures  Procedures  ED Course / MDM   Clinical  Course as of 03/15/21 0832  Thu Mar 15, 2021  0831 Resp Panel by RT-PCR (Flu A&B, Covid) Nasopharyngeal Swab [BM]  0831 hCG, quantitative, pregnancy [BM]  0831 Protime-INR [BM]  0831 APTT [BM]  0831 CBC(!) [BM]    Clinical Course User Index [BM] Deliah Boston, PA-C   Medical Decision Making Amount and/or Complexity of Data Reviewed Labs: ordered. Decision-making details documented in ED Course.    Details: Patient's lab work overall reassuring.  No leukocytosis, anemia or thrombocytopenia on CBC.  COVID/flu test is negative.  Pregnancy test negative.  INR within normal limits. Radiology: ordered.    Details: Radiologist IMPRESSION: 1. Large (nearly 9 cm long axis) infiltrative tumor in the right hemisphere with epicenter at the right temporal lobe and insula. Nearly 7 cm region of abnormal enhancement compatible with high-grade tumor. Evidence of prior tumor biopsy. 2. Intracranial mass effect with 5 mm of midline shift similar to that in October. 3. No other acute intracranial abnormality.  Risk Prescription drug management.   8:25 AM: Patient reassessed she is resting company bed no acute distress.  Vital signs stable.  Patient reports headache improving after Decadron/Dilaudid.  Anxiety improved following Ativan.  Currently awaiting transfer to [facility] --  Patient reassessed, she is resting comfortably no acute distress.  Reports slight increase in headache, no other concerns.  Dilaudid 0.5 mg every 2 hours as needed ordered.  Patient continues to await transfer to [place] signs stable.  Care handoff given to blue PA-C at shift change  Note: Portions of this report may have been transcribed using voice recognition software. Every effort was made to ensure accuracy; however, inadvertent computerized transcription errors may still be present.        Deliah Boston, PA-C 03/15/21 1518    Tegeler, Gwenyth Allegra, MD 03/16/21  [phone]

## 2021-03-15 NOTE — ED Provider Notes (Addendum)
Care transferred from previous PA-C Nuala Alpha at shift change.  Patient initially evaluated by Quincy Carnes, PA-C.  Please see initial note from initial provider Quincy Carnes, PA-C for full details of visit.   Briefly: Patient is 41 y.o. female with history of anaplastic astrocytoma who presents to the ED with headache onset 2 days ago.  At that time patient without focal neurological deficits and ambulatory.  Patient with episodes of vomiting.  Patient on oral chemotherapy (Lomustine) with her last dose being approximately 6 weeks ago.  Patient is on daily Decadron - 4 mg in AM, 1 mg in PM.   Plan: Plan per previous PA-C: Patient is pending transfer to [facility].  Initial provider contacted Dr. Jimmy Footman at [facility] who was on call for heme/onc who recommended MRI prior to transfer to [facility]. Her oncologist, Dr. Maylon Peppers is aware of the transfer.   3:44 PM - Patient assessed and noted to be resting comfortably on stretcher in no acute distress. Informed patient that she is awaiting transfer to [facility]. Informed patient to let her RN know if she needs anything while in the ED. Patient appreciative and agreeable at this time. Pt requesting saltines and graham crackers. RN made aware.   5:05 PM - Attending, Dr. Alvino Chapel notified by [facility] team that there were no available beds at this time, however, patient is in line for a bed. See Dr. Mertie Clause note for further detail.  8:28 PM - Notified by RN that [facility] on the way to transfer patient. Attending, Dr. Tomi Bamberger completed EMTALA. Pt notified.  8:50 PM - [facility] in the ED to transfer patient to [facility]. Pt noted to be ambulating in the halls and appreciative of transfer at this time. See RN note for bed assignment C-631 with accepting being Dr. Felizardo Hoffmann.   8:51 PM - Signed [facility] transfer paperwork prior to transfer.  This chart was dictated using voice recognition software, Dragon. Despite the best efforts of this provider to  proofread and correct errors, errors may still occur which can change documentation meaning.   Irie Dowson A, PA-C 03/15/21 2109    Rashiya Lofland A, PA-C 03/15/21 2111    Dorie Rank, MD 03/15/21 [phone]

## 2021-03-15 NOTE — Subjective & Objective (Signed)
Chief complaint is headache, left arm numbness History of present illness: 41 year old African-American female with a history of anaplastic astrocytoma since 2018.  She was diagnosed and treated at [facility] has had all of her oncologic care performed there.  Patient developed a headache that started today.  It was associated with some nausea.  Headache was quite intense.  She had some perceived numbness of her left arm and her left leg.  She was brought to the ER today by by ambulance.  Her 76 year old daughter is at her bedside.  Patient has been sedated with some Ativan and is difficult to obtain history from her.  Admitting vital signs in the ER temp 97.9 heart rate 88 blood pressure 130/64  Lab work is unremarkable with a normal chemistry, normal CBC, normal coags  COVID-negative, influenza negative.  CT head demonstrates a right cerebral hemisphere mass with edema with a 5 mm midline shift to the left and mass effect on the right lateral ventricle.  There is no hemorrhage identified.  EDP is discussed the case with Dr. Jimmy Footman, on-call oncologist at [facility].  Patient is awaiting a bed at [facility] for transfer for continuing oncologic care.  IV Decadron and a brain MRI have been ordered per request from on-call oncology at [facility].

## 2021-03-15 NOTE — ED Notes (Signed)
Pt given ativan and MRI tech instructed to keep on 2 L [place] for scan

## 2021-03-15 NOTE — ED Notes (Signed)
Patient remains in MRI 

## 2021-03-15 NOTE — ED Notes (Signed)
Patient transported to MRI 

## 2021-03-15 NOTE — Assessment & Plan Note (Signed)
Patient has 5 mm midline shift associated with her vasogenic edema.  This was not described on her most recent MRI at [facility] in December 2022.  EDP will need to coordinate with radiology to make sure that her CT head and her brain MRI are both burned onto a disc that can be transferred with the patient to [facility] to be uploaded to the PACS system.

## 2021-03-15 NOTE — ED Provider Notes (Signed)
°  Physical Exam  BP 127/75    Pulse (!) 2    Temp 98 F (36.7 C)    Resp 17    Ht 5\' 6"  (1.676 m)    Wt 62.6 kg    SpO2 100%    BMI 22.27 kg/m   Physical Exam  Procedures  Procedures  ED Course / MDM   Clinical Course as of 03/15/21 1655  Thu Mar 15, 2021  0831 Resp Panel by RT-PCR (Flu A&B, Covid) Nasopharyngeal Swab [BM]  0831 hCG, quantitative, pregnancy [BM]  0831 Protime-INR [BM]  0831 APTT [BM]  0831 CBC(!) [BM]    Clinical Course User Index [BM] Deliah Boston, PA-C   Medical Decision Making Amount and/or Complexity of Data Reviewed Labs: ordered. Decision-making details documented in ED Course. Radiology: ordered.  Risk Prescription drug management.   [facility] transfer service called.  Still no available beds       Davonna Belling, MD 03/15/21 1655

## 2021-05-12 ENCOUNTER — Emergency Department (HOSPITAL_COMMUNITY)
Admission: EM | Admit: 2021-05-12 | Discharge: 2021-05-13 | Disposition: A | Discharge status: Home / Self Care | Payer: Medicaid Other | Attending: Emergency Medicine | Admitting: Emergency Medicine

## 2021-05-12 ENCOUNTER — Other Ambulatory Visit: Payer: Self-pay

## 2021-05-12 ENCOUNTER — Encounter (HOSPITAL_COMMUNITY): Payer: Self-pay

## 2021-05-12 ENCOUNTER — Emergency Department (HOSPITAL_COMMUNITY): Payer: Medicaid Other

## 2021-05-12 DIAGNOSIS — R519 Headache, unspecified: Secondary | ICD-10-CM | POA: Insufficient documentation

## 2021-05-12 DIAGNOSIS — H00011 Hordeolum externum right upper eyelid: Secondary | ICD-10-CM | POA: Insufficient documentation

## 2021-05-12 DIAGNOSIS — E876 Hypokalemia: Secondary | ICD-10-CM | POA: Insufficient documentation

## 2021-05-12 DIAGNOSIS — H00014 Hordeolum externum left upper eyelid: Secondary | ICD-10-CM

## 2021-05-12 DIAGNOSIS — M542 Cervicalgia: Secondary | ICD-10-CM | POA: Insufficient documentation

## 2021-05-12 DIAGNOSIS — H5713 Ocular pain, bilateral: Secondary | ICD-10-CM | POA: Diagnosis present

## 2021-05-12 HISTORY — DX: Unspecified convulsions: R56.9

## 2021-05-12 LAB — CBC WITH DIFFERENTIAL/PLATELET
Abs Immature Granulocytes: 0.01 10*3/uL (ref 0.00–0.07)
Basophils Absolute: 0 10*3/uL (ref 0.0–0.1)
Basophils Relative: 0 %
Eosinophils Absolute: 0 10*3/uL (ref 0.0–0.5)
Eosinophils Relative: 1 %
HCT: 41.1 % (ref 36.0–46.0)
Hemoglobin: 13.5 g/dL (ref 12.0–15.0)
Immature Granulocytes: 0 %
Lymphocytes Relative: 24 %
Lymphs Abs: 1.1 10*3/uL (ref 0.7–4.0)
MCH: 32.9 pg (ref 26.0–34.0)
MCHC: 32.8 g/dL (ref 30.0–36.0)
MCV: 100.2 fL — ABNORMAL HIGH (ref 80.0–100.0)
Monocytes Absolute: 0.5 10*3/uL (ref 0.1–1.0)
Monocytes Relative: 10 %
Neutro Abs: 2.9 10*3/uL (ref 1.7–7.7)
Neutrophils Relative %: 65 %
Platelets: 234 10*3/uL (ref 150–400)
RBC: 4.1 MIL/uL (ref 3.87–5.11)
RDW: 12.1 % (ref 11.5–15.5)
WBC: 4.4 10*3/uL (ref 4.0–10.5)
nRBC: 0 % (ref 0.0–0.2)

## 2021-05-12 LAB — BASIC METABOLIC PANEL
Anion gap: 7 (ref 5–15)
BUN: 6 mg/dL (ref 6–20)
CO2: 27 mmol/L (ref 22–32)
Calcium: 9 mg/dL (ref 8.9–10.3)
Chloride: 106 mmol/L (ref 98–111)
Creatinine, Ser: 0.72 mg/dL (ref 0.44–1.00)
GFR, Estimated: >60 mL/min (ref >60)
Glucose, Bld: 86 mg/dL (ref 70–99)
Potassium: 3.1 mmol/L — ABNORMAL LOW (ref 3.5–5.1)
Sodium: 140 mmol/L (ref 135–145)

## 2021-05-12 LAB — I-STAT BETA HCG BLOOD, ED (MC, WL, AP ONLY): I-stat hCG, quantitative: <5 m[IU]/mL (ref <5)

## 2021-05-12 MED ORDER — ACETAMINOPHEN 500 MG PO TABS
1000.0000 mg | ORAL_TABLET | Freq: Once | ORAL | Status: AC
  Administered 2021-05-12: 1000 mg via ORAL
  Filled 2021-05-12: qty 2

## 2021-05-12 MED ORDER — LEVETIRACETAM 500 MG PO TABS
500.0000 mg | ORAL_TABLET | Freq: Once | ORAL | Status: AC
  Administered 2021-05-12: 500 mg via ORAL
  Filled 2021-05-12: qty 1

## 2021-05-12 MED ORDER — METOCLOPRAMIDE HCL 5 MG/ML IJ SOLN
10.0000 mg | Freq: Once | INTRAMUSCULAR | Status: AC
  Administered 2021-05-12: 10 mg via INTRAVENOUS
  Filled 2021-05-12: qty 2

## 2021-05-12 MED ORDER — LACTATED RINGERS IV BOLUS
1000.0000 mL | Freq: Once | INTRAVENOUS | Status: AC
  Administered 2021-05-12: 1000 mL via INTRAVENOUS

## 2021-05-12 MED ORDER — IOHEXOL 300 MG/ML  SOLN
80.0000 mL | Freq: Once | INTRAMUSCULAR | Status: AC | PRN
  Administered 2021-05-12: 80 mL via INTRAVENOUS

## 2021-05-12 MED ORDER — DIPHENHYDRAMINE HCL 50 MG/ML IJ SOLN
12.5000 mg | Freq: Once | INTRAMUSCULAR | Status: AC
  Administered 2021-05-12: 12.5 mg via INTRAVENOUS
  Filled 2021-05-12: qty 1

## 2021-05-12 MED ORDER — ARTIFICIAL TEARS OPHTHALMIC OINT
TOPICAL_OINTMENT | Freq: Once | OPHTHALMIC | Status: AC
  Filled 2021-05-12: qty 3.5

## 2021-05-12 NOTE — ED Provider Notes (Signed)
?[facility] DEPT ?Provider Note ? ? ?CSN: [id number] ?Arrival date & time: 05/12/21  1906 ? ?  ? ?History ? ?Chief Complaint  ?Patient presents with  ? Eye Pain  ? Headache  ? ? ?Paula Farmer is a 41 y.o. female. ? ? ?Eye Pain ?Associated symptoms include headaches.  ?Headache ?Associated symptoms: eye pain   ? 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 Symptoms have been present for weeks but acutely worsened over the past week. She now endorses worsening headache associated with this. Symptoms are worst in the monrings when she wakes up. She has been on Decadron and is undergoing treatment for anaplastic astrocytoma at [facility]. She follows with Heme Onc and neurosurgery there. She denies any fevers or chills. Endorses mild neck stiffness. She endorses pain with EOMs. ? ?Home Medications ?Prior to Admission medications   ?Medication Sig Start Date End Date Taking? Authorizing Provider  ?clindamycin (CLEOCIN) 150 MG capsule Take 2 capsules (300 mg total) by mouth 3 (three) times daily. 05/13/21  Yes Quintella Reichert, MD  ?dexamethasone (DECADRON) 4 MG tablet Take 6.5 mg by mouth daily. Take 1.5 tablets (6 mg) daily 04/26/21  Yes [provider]  ?erythromycin ophthalmic ointment Place 1 application. into both eyes at bedtime. 04/02/21  Yes [provider]  ?HYDROcodone-acetaminophen (NORCO) 10-325 MG tablet Take 1 tablet by mouth every 6 (six) hours as needed for severe pain.   Yes [provider]  ?ibuprofen (ADVIL) 600 MG tablet Take 600 mg by mouth every 6 (six) hours as needed for moderate pain. 04/26/21  Yes [provider]  ?levETIRAcetam (KEPPRA) 500 MG tablet Take 500 mg by mouth 2 (two) times daily. 03/25/21  Yes [provider]  ?LORazepam (ATIVAN) 1 MG tablet Take 1 tablet (1 mg total) by mouth 3 (three) times daily as needed for anxiety. ?Patient taking differently: Take 0.5-1 mg by mouth 3 (three) times daily as needed for anxiety. 11/05/20  Yes Daleen Bo, MD  ?methocarbamol (ROBAXIN) 500 MG tablet Take 500 mg by mouth 3 (three) times daily as needed for muscle spasms. 04/26/21  Yes [provider]  ?Multiple Vitamins-Minerals (HM MULTIVITAMIN ADULT GUMMY PO) Take 2 tablets by mouth daily.   Yes [provider]  ?omeprazole (PRILOSEC) 20 MG capsule Take 20 mg by mouth daily. 04/19/21  Yes [provider]  ?ondansetron (ZOFRAN) 8 MG tablet Take 8 mg by mouth every 8 (eight) hours as needed for nausea or vomiting. 04/26/21  Yes [provider]  ?[employer] SENNA PLUS 8.6-50 MG tablet Take 2 tablets by mouth daily as needed for moderate constipation. 04/26/21   [provider]  ?HYDROcodone-acetaminophen (NORCO) 5-325 MG tablet Take 1-2 tablets by mouth every 4 (four) hours as needed. ?Patient not taking: Reported on 03/15/2021 11/05/20   Daleen Bo, MD  ?   ? ?Allergies    ?Patient has no known allergies.   ? ?Review of Systems   ?Review of Systems  ?Eyes:  Positive for pain.  ?Neurological:  Positive for headaches.  ? ?Physical Exam ?Updated Vital Signs ?BP 123/81 (BP Location: Left Arm)   Pulse (!) 51   Temp 98.2 ?F (36.8 ?C) (Oral)   Resp (!) 22   Ht '5\' 6"'$  (1.676 m)   Wt 63.5 kg   SpO2 100%  BMI 22.60 kg/m?  ?Physical Exam ?Vitals and nursing note reviewed.  ?Constitutional:   ?   General: She is not in acute distress. ?   Appearance: She is well-developed.  ?HENT:  ?   Head: Normocephalic and atraumatic.  ?Eyes:  ?   Extraocular Movements: Extraocular movements intact.  ?   Conjunctiva/sclera: Conjunctivae normal.  ?   Comments: Vision grossly intact, pain noted with extraocular movements.  Bilateral upper and lower lid hordeolum present.  No proptosis  ?Cardiovascular:  ?    Rate and Rhythm: Normal rate and regular rhythm.  ?   Heart sounds: No murmur heard. ?Pulmonary:  ?   Effort: Pulmonary effort is normal. No respiratory distress.  ?   Breath sounds: Normal breath sounds.  ?Abdominal:  ?   Palpations: Abdomen is soft.  ?   Tenderness: There is no abdominal tenderness.  ?Musculoskeletal:     ?   General: No swelling.  ?   Cervical back: Neck supple.  ?Skin: ?   General: Skin is warm and dry.  ?   Capillary Refill: Capillary refill takes less than 2 seconds.  ?Neurological:  ?   Mental Status: She is alert.  ?Psychiatric:     ?   Mood and Affect: Mood normal.  ? ? ?ED Results / Procedures / Treatments   ?Labs ?(all labs ordered are listed, but only abnormal results are displayed) ?Labs Reviewed  ?CBC WITH DIFFERENTIAL/PLATELET - Abnormal; Notable for the following components:  ?    Result Value  ? MCV 100.2 (*)   ? All other components within normal limits  ?BASIC METABOLIC PANEL - Abnormal; Notable for the following components:  ? Potassium 3.1 (*)   ? All other components within normal limits  ?I-STAT BETA HCG BLOOD, ED ([facility], [facility], [facility] ONLY)  ? ? ?EKG ?None ? ?Radiology ?CT Orbits W Contrast ? ?Result Date: 05/12/2021 ?CLINICAL DATA:  Initial evaluation for painful swelling/styes involving both eyes, concern for orbital cellulitis. EXAM: CT ORBITS WITH CONTRAST TECHNIQUE: Multidetector CT images was performed according to the standard protocol following intravenous contrast administration. RADIATION DOSE REDUCTION: This exam was performed according to the departmental dose-optimization program which includes automated exposure control, adjustment of the mA and/or kV according to patient size and/or use of iterative reconstruction technique. CONTRAST:  68m OMNIPAQUE IOHEXOL 300 MG/ML  SOLN COMPARISON:  No pertinent prior exam. FINDINGS: Orbits: Globes are symmetric in size with normal appearance and morphology bilaterally. Intraconal and extraconal fat well-maintained. Optic nerves  symmetric and normal. Extra-ocular muscles within normal limits. No abnormality about the orbital apices. Lacrimal glands normal. Cavernous sinus within normal limits. Superior orbital veins symmetric and normal. No evidence for intraorbital or postseptal cellulitis. Visible paranasal sinuses: Visualized paranasal sinuses are clear. Mastoid air cells and middle ear cavities are well pneumatized and free of fluid. Soft tissues: Few subtle tiny nodular foci of soft tissue thickening noted about the bilateral eyelids (series 3, image 26 on the right, series 8, image 51 on the left. Findings presumably reflect small styes as provided in history. No discrete abscess or drainable fluid collection. No other significant soft tissue swelling or inflammatory changes about the periorbital or preseptal soft tissues. Osseous: No acute osseous abnormality. No discrete or worrisome osseous lesions. Limited intracranial: Prior right craniotomy with underlying malignancy within the right cerebral hemisphere, partially visualized. No other visible acute intracranial abnormality. IMPRESSION: 1. Few subtle tiny nodular foci of soft tissue thickening about the bilateral eyelids, presumably reflecting small  styes as provided in history. No discrete abscess or drainable fluid collection. No evidence for intraorbital or postseptal cellulitis. 2. Prior right craniotomy with underlying malignancy within the right cerebral hemisphere, partially visualized. Electronically Signed   By: Jeannine Boga M.D.   On: 05/12/2021 23:42   ? ?Procedures ?Procedures  ? ? ?Medications Ordered in ED ?Medications  ?clindamycin (CLEOCIN) capsule 300 mg (has no administration in time range)  ?acetaminophen (TYLENOL) tablet 1,000 mg (1,000 mg Oral Given 05/12/21 2138)  ?lactated ringers bolus 1,000 mL (0 mLs Intravenous Stopped 05/13/21 0025)  ?metoCLOPramide (REGLAN) injection 10 mg (10 mg Intravenous Given 05/12/21 2139)  ?diphenhydrAMINE (BENADRYL)  injection 12.5 mg (12.5 mg Intravenous Given 05/12/21 2139)  ?levETIRAcetam (KEPPRA) tablet 500 mg (500 mg Oral Given 05/12/21 2139)  ?artificial tears (LACRILUBE) ophthalmic ointment ( Both Eyes Given 05/12/21 2221)  ?iohexol

## 2021-05-12 NOTE — ED Triage Notes (Signed)
?  Arrives EMS from home with c/o styes on both eyes and painful swelling when waking up. Has been ongoing since December.  ? ?Recently started on radiation and IV chemotherapy for brain cancer. New tumor causing seizures. Compliant on keppra.  ?

## 2021-05-12 NOTE — ED Provider Notes (Signed)
Care assumed at 2300.  Patient with astrocytoma currently on chemotherapy here for evaluation of progressive bilateral eye swelling.  She has bilateral upper and lower styes on exam.  She has worsening crusting and pain in this area.  Care assumed pending CT orbits to rule out postseptal cellulitis. ? ?CT scan demonstrates bilateral styes, no evidence of postseptal cellulitis on imaging.  Given progressive symptoms we will start antibiotics for early preseptal cellulitis.  She has already been referred to ophthalmology through her oncologist.  Discussed local care for her styes.  Discussed continuing outpatient follow-up and return precautions. ?  ?Quintella Reichert, MD ?05/13/21 0030 ? ?

## 2021-05-13 MED ORDER — CLINDAMYCIN HCL 300 MG PO CAPS
300.0000 mg | ORAL_CAPSULE | Freq: Once | ORAL | Status: AC
  Administered 2021-05-13: 300 mg via ORAL
  Filled 2021-05-13: qty 1

## 2021-05-13 MED ORDER — CLINDAMYCIN HCL 150 MG PO CAPS: 300.0000 mg | ORAL_CAPSULE | Freq: Three times a day (TID) | ORAL | 0 refills | Status: DC

## 2021-05-13 NOTE — Discharge Instructions (Addendum)
You can use no more tears shampoo.  Please follow up with ophthalmology as arranged by your oncologist. ?

## 2021-12-04 ENCOUNTER — Encounter (HOSPITAL_COMMUNITY): Payer: Self-pay

## 2021-12-04 ENCOUNTER — Emergency Department (HOSPITAL_COMMUNITY)
Admission: EM | Admit: 2021-12-04 | Discharge: 2021-12-05 | Discharge status: Against Medical Advice | Payer: Medicaid Other | Attending: Emergency Medicine | Admitting: Emergency Medicine

## 2021-12-04 ENCOUNTER — Other Ambulatory Visit: Payer: Self-pay

## 2021-12-04 DIAGNOSIS — R531 Weakness: Secondary | ICD-10-CM | POA: Diagnosis not present

## 2021-12-04 DIAGNOSIS — Z5321 Procedure and treatment not carried out due to patient leaving prior to being seen by health care provider: Secondary | ICD-10-CM | POA: Diagnosis not present

## 2021-12-04 DIAGNOSIS — R519 Headache, unspecified: Secondary | ICD-10-CM | POA: Diagnosis present

## 2021-12-04 DIAGNOSIS — M7918 Myalgia, other site: Secondary | ICD-10-CM | POA: Diagnosis not present

## 2021-12-04 NOTE — ED Triage Notes (Signed)
Pt to er via ems, per ems pt is here for a headache and general body aches that started at 11am.  Pt states that she woke up today feeling very weak all over.

## 2021-12-19 ENCOUNTER — Other Ambulatory Visit: Payer: Self-pay

## 2021-12-19 ENCOUNTER — Encounter (HOSPITAL_COMMUNITY): Payer: Self-pay

## 2021-12-19 ENCOUNTER — Emergency Department (HOSPITAL_COMMUNITY): Payer: Medicaid Other

## 2021-12-19 ENCOUNTER — Emergency Department (HOSPITAL_COMMUNITY)
Admission: EM | Admit: 2021-12-19 | Discharge: 2021-12-20 | Payer: Medicaid Other | Attending: Emergency Medicine | Admitting: Emergency Medicine

## 2021-12-19 DIAGNOSIS — C719 Malignant neoplasm of brain, unspecified: Secondary | ICD-10-CM | POA: Insufficient documentation

## 2021-12-19 DIAGNOSIS — R519 Headache, unspecified: Secondary | ICD-10-CM | POA: Diagnosis present

## 2021-12-19 DIAGNOSIS — G919 Hydrocephalus, unspecified: Secondary | ICD-10-CM | POA: Diagnosis not present

## 2021-12-19 LAB — CBC
HCT: 42.3 % (ref 36.0–46.0)
Hemoglobin: 13.4 g/dL (ref 12.0–15.0)
MCH: 31.5 pg (ref 26.0–34.0)
MCHC: 31.7 g/dL (ref 30.0–36.0)
MCV: 99.3 fL (ref 80.0–100.0)
Platelets: 216 10*3/uL (ref 150–400)
RBC: 4.26 MIL/uL (ref 3.87–5.11)
RDW: 12.6 % (ref 11.5–15.5)
WBC: 6.5 10*3/uL (ref 4.0–10.5)
nRBC: 0 % (ref 0.0–0.2)

## 2021-12-19 LAB — BASIC METABOLIC PANEL
Anion gap: 11 (ref 5–15)
BUN: 7 mg/dL (ref 6–20)
CO2: 23 mmol/L (ref 22–32)
Calcium: 9.3 mg/dL (ref 8.9–10.3)
Chloride: 101 mmol/L (ref 98–111)
Creatinine, Ser: 0.73 mg/dL (ref 0.44–1.00)
GFR, Estimated: >60 mL/min (ref >60)
Glucose, Bld: 101 mg/dL — ABNORMAL HIGH (ref 70–99)
Potassium: 3.7 mmol/L (ref 3.5–5.1)
Sodium: 135 mmol/L (ref 135–145)

## 2021-12-19 MED ORDER — DEXAMETHASONE SODIUM PHOSPHATE 10 MG/ML IJ SOLN
10.0000 mg | Freq: Once | INTRAMUSCULAR | Status: AC
  Administered 2021-12-19: 10 mg via INTRAVENOUS
  Filled 2021-12-19: qty 1

## 2021-12-19 MED ORDER — KCL IN DEXTROSE-NACL 30-5-0.45 MEQ/L-%-% IV SOLN
INTRAVENOUS | Status: DC
  Filled 2021-12-19 (×2): qty 1000

## 2021-12-19 MED ORDER — MORPHINE SULFATE (PF) 4 MG/ML IV SOLN
4.0000 mg | Freq: Once | INTRAVENOUS | Status: AC
  Administered 2021-12-19: 4 mg via INTRAVENOUS
  Filled 2021-12-19: qty 1

## 2021-12-19 NOTE — ED Triage Notes (Signed)
BIBA c/o right sided headache with left sided weakness x1 week with nuasea and sensitivity to left.  Pt reports unable to lift left leg and lift objects with left hand.  Hx of brain tumor and hydrocephalus.  '4mg'$  Zofran with ems.

## 2021-12-19 NOTE — ED Provider Notes (Signed)
[facility] DEPT Provider Note   CSN: [id number] Arrival date & time: 12/19/21  1349     History  Chief Complaint  Patient presents with   Headache    Paula Farmer is a 41 y.o. female.   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  Patient has been receiving her treatment at [facility].  Patient states she is no longer undergoing any chemotherapy.  Patient states her doctors last told her there was nothing more they could do.  Patient was referred to pain management but she was not able to follow-up with that appointment.  Patient states last night her apartment was very cold.  She thinks this partly triggered her headaches which is more severe today than usual.  Patient also is complaining of new weakness in her left arm and left leg.  She is dragging her foot.  Patient states usually she does not this week on the left side  Home Medications Prior to Admission medications   Medication Sig Start Date End Date Taking? Authorizing Provider  clindamycin (CLEOCIN) 150 MG capsule Take 2 capsules (300 mg total) by mouth 3 (three) times daily. 05/13/21   Quintella Reichert, MD  [employer] SENNA PLUS 8.6-50 MG tablet Take 2 tablets by mouth daily as needed for moderate constipation. 04/26/21   [provider]  dexamethasone (DECADRON) 4 MG tablet Take 6.5 mg by mouth daily. Take 1.5 tablets (6 mg) daily 04/26/21   [provider]  erythromycin ophthalmic ointment Place 1 application. into both eyes at bedtime. 04/02/21   [provider]  HYDROcodone-acetaminophen (NORCO) 10-325 MG tablet Take 1 tablet by mouth every 6 (six) hours as needed for severe pain.    [provider]  HYDROcodone-acetaminophen (NORCO) 5-325 MG tablet Take 1-2 tablets by mouth every 4 (four) hours as needed. Patient not taking: Reported on 03/15/2021 11/05/20   Daleen Bo, MD  ibuprofen  (ADVIL) 600 MG tablet Take 600 mg by mouth every 6 (six) hours as needed for moderate pain. 04/26/21   [provider]  levETIRAcetam (KEPPRA) 500 MG tablet Take 500 mg by mouth 2 (two) times daily. 03/25/21   [provider]  LORazepam (ATIVAN) 1 MG tablet Take 1 tablet (1 mg total) by mouth 3 (three) times daily as needed for anxiety. Patient taking differently: Take 0.5-1 mg by mouth 3 (three) times daily as needed for anxiety. 11/05/20   Daleen Bo, MD  methocarbamol (ROBAXIN) 500 MG tablet Take 500 mg by mouth 3 (three) times daily as needed for muscle spasms. 04/26/21   [provider]  Multiple Vitamins-Minerals (HM MULTIVITAMIN ADULT GUMMY PO) Take 2 tablets by mouth daily.    [provider]  omeprazole (PRILOSEC) 20 MG capsule Take 20 mg by mouth daily. 04/19/21   [provider]  ondansetron (ZOFRAN) 8 MG tablet Take 8 mg by mouth every 8 (eight) hours as needed for nausea or vomiting. 04/26/21   [provider]      Allergies    Patient has no known allergies.    Review of Systems   Review of Systems  Neurological:  Positive for headaches.    Physical Exam Updated Vital Signs BP 130/80   Pulse 75   Temp 98.5 F (36.9 C) (Oral)   Resp 18   Ht 1.676 m ('5\' 6"'$ )   Wt 72.6 kg   SpO2 100%   BMI 25.82 kg/m  Physical Exam Vitals and nursing note reviewed.  Constitutional:      General: She is not in acute distress.    Appearance: She is well-developed.  HENT:     Head: Normocephalic and atraumatic.     Right Ear: External ear normal.     Left Ear: External ear normal.  Eyes:     General: No scleral icterus.       Right eye: No discharge.        Left eye: No discharge.     Conjunctiva/sclera: Conjunctivae normal.  Neck:     Trachea: No tracheal deviation.  Cardiovascular:     Rate and Rhythm: Normal rate and regular rhythm.  Pulmonary:     Effort: Pulmonary effort is normal. No respiratory distress.     Breath  sounds: Normal breath sounds. No stridor. No wheezing or rales.  Abdominal:     General: Bowel sounds are normal. There is no distension.     Palpations: Abdomen is soft.     Tenderness: There is no abdominal tenderness. There is no guarding or rebound.  Musculoskeletal:        General: No tenderness or deformity.     Cervical back: Neck supple.  Skin:    General: Skin is warm and dry.     Findings: No rash.  Neurological:     General: No focal deficit present.     Mental Status: She is alert.     Cranial Nerves: No cranial nerve deficit (, no slurred speech) or dysarthria.     Sensory: No sensory deficit.     Motor: Weakness present. No abnormal muscle tone or seizure activity.     Coordination: Coordination normal.     Comments: Patient able to lift her arm off the bed although weaker than the right, patient unable to left leg off the bed  Psychiatric:        Mood and Affect: Mood normal.     ED Results / Procedures / Treatments   Labs (all labs ordered are listed, but only abnormal results are displayed) Labs Reviewed  CBC  BASIC METABOLIC PANEL    EKG None  Radiology CT Head Wo Contrast  Result Date: 12/19/2021 CLINICAL DATA:  Headache.  History of anaplastic astrocytoma. EXAM: CT HEAD WITHOUT CONTRAST TECHNIQUE: Contiguous axial images were obtained from the base of the skull through the vertex without intravenous contrast. RADIATION DOSE REDUCTION: This exam was performed according to the departmental dose-optimization program which includes automated exposure control, adjustment of the mA and/or kV according to patient size and/or use of iterative reconstruction technique. COMPARISON:  Brain MRI 03/15/2021 FINDINGS: Brain: Again seen is large infiltrative mixed solid and cystic mass centered in the right temporal lobe extending to the frontal lobe and basal ganglia. The mass lesion and associated edema appear increased in size/severity compared to the MRI from February  with worsened involvement of the frontal white matter, internal capsule, and centrum semiovale. There is worsened mass effect now with near-complete effacement of the fourth ventricle, effacement of the right ambient cistern with medialization of the uncus, and 1.2 cm leftward midline shift, increased from 0.6 cm on the prior MRI. There is no acute intracranial hemorrhage or large vessel territorial infarct. There is no evidence of trapped ventricle or developing hydrocephalus. Vascular: No hyperdense vessel or unexpected calcification. Skull: A right parietal burr hole is noted. There is no suspicious osseous lesion. Sinuses/Orbits: The imaged paranasal sinuses are clear. The globes and orbits are unremarkable.  Other: None. IMPRESSION: Infiltrative mass lesion centered in the right temporal lobe with surrounding edema is significantly worsened since the MRI from 03/15/2021 with worsened mass effect and 1.2 cm leftward midline shift, increased from 0.6 cm on the prior MRI. Recommend neurosurgical consultation. These results were called by telephone at the time of interpretation on 12/19/2021 at 4:28 pm to provider Dr Tomi Bamberger, who verbally acknowledged these results. Electronically Signed   By: Valetta Mole M.D.   On: 12/19/2021 16:44    Procedures Procedures    Medications Ordered in ED Medications  morphine (PF) 4 MG/ML injection 4 mg (has no administration in time range)  dexamethasone (DECADRON) injection 10 mg (10 mg Intravenous Given 12/19/21 1656)    ED Course/ Medical Decision Making/ A&P Clinical Course as of 12/19/21 1736  Wed Dec 19, 2021  1648 CT scan report reviewed with radiology.  Worsening tumor noted.  Patient has midline shift [JK]  1659 Discussed case with Dr Zada Finders.  She also has a component of hydrocephalus .  We will first try to contact [facility] to see about possible transfer since she has been receiving her treatment there.  If not I will call Dr. Zada Finders back for tx in  [place] [JK]  0938 Case was discussed with Dr. Archie Balboa at [facility] oncology.  He accepts the patient for transfer to the hospital for further treatment [JK]    Clinical Course User Index [JK] Dorie Rank, MD                           Medical Decision Making Problems Addressed: Anaplastic astrocytoma [facility]): chronic illness or injury with exacerbation, progression, or side effects of treatment Hydrocephalus, unspecified type [facility]): acute illness or injury that poses a threat to life or bodily functions  Amount and/or Complexity of Data Reviewed Labs: ordered. Decision-making details documented in ED Course. Radiology: ordered and independent interpretation performed.  Risk Prescription drug management. Parenteral controlled substances. Risk Details: Case discussed with neurosurgery here at [facility].  Case also discussed with oncology at [facility]   known history of astrocytoma.  Unfortunately has progression on her CT scan.  There is evidence of edema midline shift and hydrocephalus.  Patient is currently stable GCS 15.  No altered mental status.  No seizures.  We will plan on transfer to [facility] for further treatment.  Patient has been treated with IV pain medications and IV Decadron       Final Clinical Impression(s) / ED Diagnoses Final diagnoses:  Anaplastic astrocytoma ([place])  Hydrocephalus, unspecified type [facility])    Rx / DC Orders ED Discharge Orders     None         Dorie Rank, MD 12/19/21 1736

## 2021-12-19 NOTE — ED Notes (Signed)
Transportation arranged

## 2021-12-19 NOTE — ED Provider Triage Note (Signed)
Emergency Medicine Provider Triage Evaluation Note  Paula Farmer, a 41 y.o. female was evaluated in triage.  Pt complains of headache.  Patient has a history of anaplastic astrocytoma.  She states that she did have a fall about 3 days ago, however current headache pain started about 2 days ago.  Has progressively worsened.  No vomiting or vision change.  She has taken home meds without improvement.  States that she has never had a lumbar puncture.  Review of Systems  Positive: Headache Negative: Vomiting  Physical Exam  BP (!) 140/90 (BP Location: Right Arm)   Pulse (!) 104   Temp 98.5 F (36.9 C) (Oral)   Resp 18   SpO2 96%  Gen:   Awake, no distress   Resp:  Normal effort  MSK:   Moves extremities without difficulty  Other:    Medical Decision Making  Medically screening exam initiated at 2:43 PM.  Appropriate orders placed.  Destenee Guerry was informed that the remainder of the evaluation will be completed by another provider, this initial triage assessment does not replace that evaluation, and the importance of remaining in the ED until their evaluation is complete.     Carlisle Cater, PA-C 12/19/21 1445

## 2021-12-20 NOTE — ED Provider Notes (Signed)
Patient reevaluated prior to ambulance transfer.  She is resting comfortably with stable vital signs.   Delora Fuel, MD [birth year] [phone]

## 2022-01-13 ENCOUNTER — Encounter (HOSPITAL_COMMUNITY): Payer: Self-pay

## 2022-01-13 ENCOUNTER — Emergency Department (HOSPITAL_COMMUNITY)
Admission: EM | Admit: 2022-01-13 | Discharge: 2022-01-13 | Disposition: A | Discharge status: Home / Self Care | Payer: Medicaid Other | Attending: Emergency Medicine | Admitting: Emergency Medicine

## 2022-01-13 ENCOUNTER — Emergency Department (HOSPITAL_COMMUNITY): Payer: Medicaid Other

## 2022-01-13 ENCOUNTER — Other Ambulatory Visit: Payer: Self-pay

## 2022-01-13 DIAGNOSIS — R519 Headache, unspecified: Secondary | ICD-10-CM

## 2022-01-13 DIAGNOSIS — C719 Malignant neoplasm of brain, unspecified: Secondary | ICD-10-CM | POA: Diagnosis not present

## 2022-01-13 MED ORDER — MORPHINE SULFATE (PF) 4 MG/ML IV SOLN
4.0000 mg | Freq: Once | INTRAVENOUS | Status: AC
  Administered 2022-01-13: 4 mg via INTRAVENOUS
  Filled 2022-01-13 (×2): qty 1

## 2022-01-13 MED ORDER — SODIUM CHLORIDE 0.9 % IV BOLUS
500.0000 mL | Freq: Once | INTRAVENOUS | Status: AC
  Administered 2022-01-13: 500 mL via INTRAVENOUS

## 2022-01-13 MED ORDER — MORPHINE SULFATE (PF) 4 MG/ML IV SOLN
4.0000 mg | Freq: Once | INTRAVENOUS | Status: AC
  Administered 2022-01-13: 4 mg via INTRAVENOUS
  Filled 2022-01-13: qty 1

## 2022-01-13 MED ORDER — DEXAMETHASONE SODIUM PHOSPHATE 10 MG/ML IJ SOLN
10.0000 mg | Freq: Once | INTRAMUSCULAR | Status: AC
  Administered 2022-01-13: 10 mg via INTRAVENOUS
  Filled 2022-01-13: qty 1

## 2022-01-13 NOTE — ED Notes (Signed)
Pt given coffee per request.  Pt reports she is trying to get money for an [facility].

## 2022-01-13 NOTE — ED Provider Triage Note (Signed)
Emergency Medicine Provider Triage Evaluation Note  Paula Farmer , a 41 y.o. female  was evaluated in triage.  Pt complains of headache.  Headache is localized to the right side of the head.  States she has a brain tumor that headache is where the tumor is.  He was evaluated prior to Thanksgiving for similar.  States she has some lightheadedness as well.  Tried Robaxin this morning for the headache with no relief.  Denies numbness or tingling anywhere.  Review of Systems  Positive: As above Negative: As above  Physical Exam  BP (!) 141/92 (BP Location: Right Arm)   Pulse 82   Temp 98.3 F (36.8 C) (Oral)   Resp 18   Ht '5\' 6"'$  (1.676 m)   Wt 72.6 kg   LMP 12/15/2021   SpO2 97% Comment: Simultaneous filing. User may not have seen previous data.  BMI 25.82 kg/m  Gen:   Awake, no distress   Resp:  Normal effort  MSK:   Moves extremities without difficulty  Other:  Resting comfortably in triage  Medical Decision Making  Medically screening exam initiated at 8:47 AM.  Appropriate orders placed.  Paula Farmer was informed that the remainder of the evaluation will be completed by another provider, this initial triage assessment does not replace that evaluation, and the importance of remaining in the ED until their evaluation is complete.     Paula Farmer, [place] 01/13/22 [phone]

## 2022-01-13 NOTE — ED Provider Notes (Signed)
[facility] DEPT Provider Note   CSN: [id number] Arrival date & time: 01/13/22  0809     History  Chief Complaint  Patient presents with   Headache    Paula Farmer is a 41 y.o. female.  With a history of astrocytoma of the right hemisphere, anxiety who presents to the ED for evaluation of headache.  She states that she has headaches due to her brain tumor.  The headache is currently involving the entire head but worse over the right side where the tumor is.  She reportedly sees Dr. Lars Pinks at [facility] for her brain tumor.  Last saw Dr. Lars Pinks on 01/09/2022.  He started her on a chemotherapy medication which she reports compliance with.  Denies side effects.  States she has not been told what to do when she gets headaches.  Has some left-sided weakness which has remained unchanged over the past 3 weeks.  Denies vision changes, numbness, tingling, falls.  She is starting IV chemotherapy tomorrow.   Headache      Home Medications Prior to Admission medications   Medication Sig Start Date End Date Taking? Authorizing Provider  clindamycin (CLEOCIN) 150 MG capsule Take 2 capsules (300 mg total) by mouth 3 (three) times daily. 05/13/21   Quintella Reichert, MD  [facility] SENNA PLUS 8.6-50 MG tablet Take 2 tablets by mouth daily as needed for moderate constipation. 04/26/21   [provider]  dexamethasone (DECADRON) 4 MG tablet Take 6.5 mg by mouth daily. Take 1.5 tablets (6 mg) daily 04/26/21   [provider]  erythromycin ophthalmic ointment Place 1 application. into both eyes at bedtime. 04/02/21   [provider]  HYDROcodone-acetaminophen (NORCO) 10-325 MG tablet Take 1 tablet by mouth every 6 (six) hours as needed for severe pain.    [provider]  HYDROcodone-acetaminophen (NORCO) 5-325 MG tablet Take 1-2 tablets by mouth every 4 (four) hours as needed. Patient not taking: Reported on 03/15/2021 11/05/20   Daleen Bo, MD  ibuprofen (ADVIL) 600 MG tablet Take 600 mg by mouth every 6 (six) hours as needed for moderate pain. 04/26/21   [provider]  levETIRAcetam (KEPPRA) 500 MG tablet Take 500 mg by mouth 2 (two) times daily. 03/25/21   [provider]  LORazepam (ATIVAN) 1 MG tablet Take 1 tablet (1 mg total) by mouth 3 (three) times daily as needed for anxiety. Patient taking differently: Take 0.5-1 mg by mouth 3 (three) times daily as needed for anxiety. 11/05/20   Daleen Bo, MD  methocarbamol (ROBAXIN) 500 MG tablet Take 500 mg by mouth 3 (three) times daily as needed for muscle spasms. 04/26/21   [provider]  Multiple Vitamins-Minerals (HM MULTIVITAMIN ADULT GUMMY PO) Take 2 tablets by mouth daily.    [provider]  omeprazole (PRILOSEC) 20 MG capsule Take 20 mg by mouth daily. 04/19/21   [provider]  ondansetron (ZOFRAN) 8 MG tablet Take 8 mg by mouth every 8 (eight) hours as needed for nausea or vomiting. 04/26/21   [provider]      Allergies    Patient has no known allergies.    Review of Systems   Review of Systems  Neurological:  Positive for headaches.    Physical Exam Updated Vital Signs BP (!) 132/95   Pulse 100   Temp 98.3 F (36.8 C) (Oral)   Resp 17   Ht '5\' 6"'$  (1.676 m)   Wt 72.6 kg   LMP 12/15/2021  SpO2 99%   BMI 25.82 kg/m  Physical Exam  ED Results / Procedures / Treatments   Labs (all labs ordered are listed, but only abnormal results are displayed) Labs Reviewed - No data to display  EKG None  Radiology CT Head Wo Contrast  Result Date: 01/13/2022 CLINICAL DATA:  41 year old female with a history of right hemisphere anaplastic astrocytoma. Increasing headache. EXAM: CT HEAD WITHOUT CONTRAST TECHNIQUE: Contiguous axial images were obtained from the base of the skull through the vertex without intravenous contrast. RADIATION DOSE REDUCTION: This exam was performed according to the  departmental dose-optimization program which includes automated exposure control, adjustment of the mA and/or kV according to patient size and/or use of iterative reconstruction technique. COMPARISON:  Head CT 12/19/2021 and earlier. FINDINGS: Brain: Heterogeneous, infiltrative right hemisphere tumor with intracranial mass effect. Leftward midline shift appears stable from last month at 11 mm on series 2, image 15. Effaced body of the right lateral ventricle, possibly with some trapping of the right atrium and occipital horn. Other low-density in the right temporal lobe is favored to be tumor and/or resection cavity related. The left lateral ventricle and left occipital and temporal horns also appear slightly enlarged since the February MRI. Difficult to exclude mild transependymal edema. Partially effaced suprasellar cistern is stable. Other basilar cisterns remain patent. No acute intracranial hemorrhage identified. No cortically based acute infarct identified. Vascular: No suspicious intracranial vascular hyperdensity. Skull: Right lateral burr hole, likely biopsy related again noted. No acute osseous abnormality identified. Sinuses/Orbits: Visualized paranasal sinuses and mastoids are stable and well aerated. Other: Visualized orbits and scalp soft tissues are within normal limits. IMPRESSION: 1. Pronounced infiltrating tumor re-demonstrated in the right hemisphere. Subsequent intracranial mass effect is stable from last month with leftward midline shift of 11 mm. 2. 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 Difficult to exclude mild associated transependymal edema. 3. No intracranial hemorrhage or new intracranial abnormality. Electronically Signed   By: Genevie Ann M.D.   On: 01/13/2022 09:31    Procedures Procedures    Medications Ordered in ED Medications - No data to display  ED Course/ Medical Decision Making/ A&P Clinical Course as  of 01/13/22 1451  Sun Jan 13, 2022  1432 Spoke with neurosurgery Dr. Kathyrn Sheriff.  He recommends discharge with close outpatient follow-up as patient is chronically followed by [facility].  Transfer to [facility] if there is an acute reason. [AS]    Clinical Course User Index [AS] Arhan Mcmanamon, [place]on Folk, PA-C                           Medical Decision Making Amount and/or Complexity of Data Reviewed Radiology: ordered.  Risk Prescription drug management.  This patient presents to the ED for concern of headache, this involves an extensive number of treatment options, and is a complaint that carries with it a high risk of complications and morbidity. Emergent considerations for headache include subarachnoid hemorrhage, meningitis, temporal arteritis, glaucoma, cerebral ischemia, carotid/vertebral dissection, intracranial tumor, Venous sinus thrombosis, carbon monoxide poisoning, acute or chronic subdural hemorrhage.  Other considerations include: Migraine, Cluster headache, Hypertension, Caffeine, alcohol, or drug withdrawal, Pseudotumor cerebri, Arteriovenous malformation, Head injury, Neurocysticercosis, Post-lumbar puncture, Preeclampsia, Tension headache, Sinusitis, Cervical arthritis, Refractive error causing strain, Dental abscess, Otitis media, Temporomandibular joint syndrome, Depression, Somatoform disorder (eg, somatization) Trigeminal neuralgia, Glossopharyngeal neuralgia.    Co morbidities that complicate the patient evaluation   astrocytoma of  the right hemisphere  My initial workup includes pain medication, CT head  Additional history obtained from: Nursing notes from this visit.  I ordered imaging studies including CT head I independently visualized and interpreted imaging which showed consistent findings of astrocytoma of the right hemisphere with midline shift of 11 mm.  No recent changes. I agree with the radiologist interpretation  Consultations Obtained:  I requested  consultation with the neurosurgery Dr. Kathyrn Sheriff,  and discussed lab and imaging findings as well as pertinent plan - they recommend: Treat headache and discharge home if no acute emergencies.  Close follow-up with [facility]  Afebrile, hemodynamically stable.  41 year old female presenting to the ED for evaluation of right-sided headache.  Has known astrocytoma of the site.  No recent changes on head CT.  She does have mild left-sided strength deficits, is ambulatory in the ED and has no new neurologic findings.  Her pain was controlled in the ED.  Neurosurgery was consulted and recommends above.  Patient cleared for discharge.  She was strongly encouraged to keep her follow-up appointments at [facility].  She reports starting IV chemotherapy on Monday. I strongly encouraged her to keep these appointments.  She was given strict return precautions.  Stable at discharge.  At this time there does not appear to be any evidence of an acute emergency medical condition and the patient appears stable for discharge with appropriate outpatient follow up. Diagnosis was discussed with patient who verbalizes understanding of care plan and is agreeable to discharge. I have discussed return precautions with patient who verbalizes understanding. Patient encouraged to follow-up with their PCP within 1 week. All questions answered.  Patient's case discussed with Dr. Johnney Killian who agrees with plan to discharge with follow-up.   Note: Portions of this report may have been transcribed using voice recognition software. Every effort was made to ensure accuracy; however, inadvertent computerized transcription errors may still be present.         Final Clinical Impression(s) / ED Diagnoses Final diagnoses:  None    Rx / DC Orders ED Discharge Orders     None         [name], Hershal Coria 01/13/22 1531    Charlesetta Shanks, MD 01/14/22 [phone]

## 2022-01-13 NOTE — ED Triage Notes (Signed)
Per EMS- Patient is from home. Patient c/o headache and nausea. Patient states she has a history of a brain tumor and on her last CT scan the tumor had increased.

## 2022-01-13 NOTE — Discharge Instructions (Signed)
You have been seen today for your complaint of a headache like. Your imaging showed no recent changes. Your discharge medications include your home medications. Follow up with: [facility] for your regular care.  Do not miss any appointments. Please seek immediate medical care if you develop any of the following symptoms: Your headache: Becomes severe quickly. Gets worse after moderate to intense physical activity. You have any of these symptoms: Repeated vomiting. Pain or stiffness in your neck. Changes to your vision. Pain in an eye or ear. Problems with speech. Muscular weakness or loss of muscle control. Loss of balance or coordination. You feel faint or pass out. You have confusion. You have a seizure. At this time there does not appear to be the presence of an emergent medical condition, however there is always the potential for conditions to change. Please read and follow the below instructions.  Do not take your medicine if  develop an itchy rash, swelling in your mouth or lips, or difficulty breathing; call 911 and seek immediate emergency medical attention if this occurs.  You may review your lab tests and imaging results in their entirety on your MyChart account.  Please discuss all results of fully with your primary care provider and other specialist at your follow-up visit.  Note: Portions of this text may have been transcribed using voice recognition software. Every effort was made to ensure accuracy; however, inadvertent computerized transcription errors may still be present.

## 2022-01-13 NOTE — ED Notes (Signed)
Pt wheeled to uber. Pt verbalized understanding of discharge instructions.

## 2022-01-17 ENCOUNTER — Telehealth: Payer: Self-pay | Admitting: *Deleted

## 2022-01-17 NOTE — Telephone Encounter (Signed)
Received fax from [facility] from Dr Lorette Ang office.  Patient would like to have her treatments for Avastin locally.  Attempted to reach patient to schedule meet and greet visit with Dr Mickeal Skinner before her next due infusion which is around January 4th, 2024.  Unable to leave message on her voicemail.  Attempted to reach patients mother , left a message pending a call back.

## 2022-01-22 ENCOUNTER — Inpatient Hospital Stay: Payer: Medicaid Other | Attending: Internal Medicine | Admitting: Internal Medicine

## 2022-01-22 ENCOUNTER — Telehealth: Payer: Self-pay | Admitting: Internal Medicine

## 2022-01-22 ENCOUNTER — Other Ambulatory Visit: Payer: Self-pay

## 2022-01-22 VITALS — BP 121/85 | HR 85 | Temp 98.1°F | Resp 16 | Ht 66.0 in | Wt 163.1 lb

## 2022-01-22 DIAGNOSIS — R519 Headache, unspecified: Secondary | ICD-10-CM | POA: Diagnosis not present

## 2022-01-22 DIAGNOSIS — C711 Malignant neoplasm of frontal lobe: Secondary | ICD-10-CM | POA: Insufficient documentation

## 2022-01-22 DIAGNOSIS — F17210 Nicotine dependence, cigarettes, uncomplicated: Secondary | ICD-10-CM | POA: Insufficient documentation

## 2022-01-22 DIAGNOSIS — Z923 Personal history of irradiation: Secondary | ICD-10-CM | POA: Diagnosis not present

## 2022-01-22 DIAGNOSIS — Z7952 Long term (current) use of systemic steroids: Secondary | ICD-10-CM | POA: Insufficient documentation

## 2022-01-22 DIAGNOSIS — C719 Malignant neoplasm of brain, unspecified: Secondary | ICD-10-CM

## 2022-01-22 MED ORDER — AMITRIPTYLINE HCL 50 MG PO TABS: 50.0000 mg | ORAL_TABLET | Freq: Every day | ORAL | 1 refills | Status: DC

## 2022-01-22 NOTE — Progress Notes (Signed)
[facility] [place], [place] [address] [phone]   New Patient Evaluation  Date of Service: 01/22/22 Patient Name: Paula Farmer Patient MRN: [id number] Patient DOB: 06-Jul-1980 Provider: Ventura Sellers, MD  Identifying Statement:  Paula Farmer is a 41 y.o. female with right frontal anaplastic astrocytoma who presents for initial consultation and evaluation.    Referring Provider: [facility] [place],  [place] [address]  Oncologic History: Anaplastic astrocytoma [facility])  09/18/2016 Initial Diagnosis  Presented with headaches, heaviness in the legs, nausea and gait instability.  CT head: hypoattenuation in the right frontal, temporal and parietal lobes with local mass effect MRI brain: Abnormal large area of T2 hyperintensity of the right temporal and parietal lobes concerning for low-grade primary CNS neoplasm  09/19/2016 Biopsy  Right brain: anaplastic astrocytoma, WHO Grade III, IDH mutant, 1p/19q intact, MGMT methylated  10/21/2016 - 12/04/2016 Combination Chemo / RT Therapy  Radiation (59.4 Gy) and concurrent temozolomide  02/07/2017 - 09/25/2017 Chemotherapy  C1D1 adjuvant temozolomide 150 mg/m2. She only received 3 monthly cycles of TMZ secondary to noncompliance with appointments.  06/21/2020 Surgery  Stereotactic biopsy (Dr. 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   07/17/2020 - 07/17/2020 Chemotherapy  OP ONC Brain - Temozolomide (Days 1-5) Plan Provider: Erin Fulling, MD Treatment goal: Adjuvant Line of treatment: Adjuvant  09/05/2020 Genetic Test  The patient had genetic testing of her tumor through [facility] which reported on this day. Her tumor was found to be MGMT promoter methylated and IDH 1 mutated but did not have other targetable mutations.  10/12/2020 - 11/24/2020 Chemotherapy  She  began cycle 1 of lomustine on 11/06/2020. She received a total dose of 130 mg which was approximately 80 mg per metered squared. She was to take one 100 mg capsule and three 10 mg capsules. She did eventually complete 2 cycles of the lomustine.  OP ONC brain lomustine Plan Provider: Erin Fulling, MD Treatment goal: Adjuvant Line of treatment: Adjuvant  03/15/2021 Progression  Imaging evidence of progression along with worsening symptoms of headaches and seizures.  04/16/2021 - Chemotherapy  C1-3/20; C2-4/3; C3 4/17; C4 5/1; C5 5/15; C6 06/26/21; C7 07/12/21; C8 08/02/21 OP ONC Brain - Bevacizumab Plan Provider: Erin Fulling, MD Treatment goal: Control Line of treatment: [No plan line of treatment]  05/02/2021 - 06/06/2021 Radiation Therapy  Repeat simulation for radiation for recurrent disease. Tolerated well with increased headaches requiring steroids in latter part of treatment.   20/94/7096 Complications  Patient was hospitalized on the inpatient oncology service after being admitted for progressive headaches and left hemiparesis. She responded somewhat to increases in her steroids after imaging confirmed progressive disease. After discussion with family, she wished to try the IDH inhibitor ivosidenib and we were able to obtain this for her which she started at the time of discharge on 12/26/2021.  12/26/2021 - Chemotherapy  Patient began oral ivosidenib therapy 250 mg tablets, 2 daily. We also elected to restart her bevacizumab initially at a dose of 5 mg/kg which will be dose escalated to 10 mg/kg with subsequent treatments.   Biomarkers:  MGMT Methylated.  IDH 1/2 Mutated.  EGFR Unknown  1p/19q Wild type   History of Present Illness: The patient's records from the referring physician were obtained and reviewed and the patient interviewed to confirm this HPI.  Paula Farmer presents today for evaluation, plan for  avastin therapy at request of her primary oncologist, Dr.  Maylon Peppers.  She continues to complain of diffuse pain, primarily in "leg muscles", but also in right knee.  More recently has complained of headaches which now occur every night.  She is minimally active at home, does walk with a cane.  No recent PT.  Sleep is very poor and interrupted.  Decadron currently at 54m daily.   Medications: Current Outpatient Medications on File Prior to Visit  Medication Sig Dispense Refill   clindamycin (CLEOCIN) 150 MG capsule Take 2 capsules (300 mg total) by mouth 3 (three) times daily. 63 capsule 0   [employer] SENNA PLUS 8.6-50 MG tablet Take 2 tablets by mouth daily as needed for moderate constipation.     dexamethasone (DECADRON) 4 MG tablet Take 6.5 mg by mouth daily. Take 1.5 tablets (6 mg) daily     erythromycin ophthalmic ointment Place 1 application. into both eyes at bedtime.     HYDROcodone-acetaminophen (NORCO) 10-325 MG tablet Take 1 tablet by mouth every 6 (six) hours as needed for severe pain.     HYDROcodone-acetaminophen (NORCO) 5-325 MG tablet Take 1-2 tablets by mouth every 4 (four) hours as needed. (Patient not taking: Reported on 03/15/2021) 20 tablet 0   ibuprofen (ADVIL) 600 MG tablet Take 600 mg by mouth every 6 (six) hours as needed for moderate pain.     levETIRAcetam (KEPPRA) 500 MG tablet Take 500 mg by mouth 2 (two) times daily.     LORazepam (ATIVAN) 1 MG tablet Take 1 tablet (1 mg total) by mouth 3 (three) times daily as needed for anxiety. (Patient taking differently: Take 0.5-1 mg by mouth 3 (three) times daily as needed for anxiety.) 15 tablet 0   methocarbamol (ROBAXIN) 500 MG tablet Take 500 mg by mouth 3 (three) times daily as needed for muscle spasms.     Multiple Vitamins-Minerals (HM MULTIVITAMIN ADULT GUMMY PO) Take 2 tablets by mouth daily.     omeprazole (PRILOSEC) 20 MG capsule Take 20 mg by mouth daily.     ondansetron (ZOFRAN) 8 MG tablet Take 8 mg by mouth every 8 (eight) hours as needed for nausea or vomiting.     No current  facility-administered medications on file prior to visit.    Allergies: No Known Allergies Past Medical History:  Past Medical History:  Diagnosis Date   [place]    Seizures ([facility]    Past Surgical History: No past surgical history on file. Social History:  Social History   Socioeconomic History   Marital status: Single    Spouse name: Not on file   Number of children: Not on file   Years of education: Not on file   Highest education level: Not on file  Occupational History   Not on file  Tobacco Use   Smoking status: Some Days    Packs/day: 0.25    Years: 10.00    Total pack years: 2.50    Types: Cigarettes   Smokeless tobacco: Never  Vaping Use   Vaping Use: Never used  Substance and Sexual Activity   Alcohol use: Not Currently    Alcohol/week: 2.0 standard drinks of alcohol    Types: 2 Glasses of wine per week    Comment: drinks wine twice a month   Drug use: Not Currently   Sexual activity: Not Currently  Other Topics Concern   Not on file  Social History Narrative   Not on file   Social Determinants of Health  Financial Resource Strain: Not on file  Food Insecurity: Not on file  Transportation Needs: Not on file  Physical Activity: Not on file  Stress: Not on file  Social Connections: Not on file  Intimate Partner Violence: Not on file   Family History:  Family History  Problem Relation Age of Onset   Cancer Mother     Review of Systems: Constitutional: Doesn't report fevers, chills or abnormal weight loss Eyes: Doesn't report blurriness of vision Ears, nose, mouth, throat, and face: Doesn't report sore throat Respiratory: Doesn't report cough, dyspnea or wheezes Cardiovascular: Doesn't report palpitation, chest discomfort  Gastrointestinal:  Doesn't report nausea, constipation, diarrhea GU: Doesn't report incontinence Skin: Doesn't report skin rashes Neurological: Per HPI Musculoskeletal: Doesn't report joint pain Behavioral/Psych:  Doesn't report anxiety  Physical Exam: Vitals:   01/22/22 1100  BP: 121/85  Pulse: 85  Resp: 16  Temp: 98.1 F (36.7 C)  SpO2: 100%   KPS: 60. General: Alert, cooperative, pleasant, in no acute distress Head: Normal EENT: No conjunctival injection or scleral icterus.  Lungs: Resp effort normal Cardiac: Regular rate Abdomen: Non-distended abdomen Skin: No rashes cyanosis or petechiae. Extremities: No clubbing or edema  Neurologic Exam: Mental Status: Awake, alert, attentive to examiner. Oriented to self and environment. Language is fluent with intact comprehension.  Cranial Nerves: Visual acuity is grossly normal. Visual fields are full. Extra-ocular movements intact. No ptosis. Face is symmetric Motor: Tone and bulk are normal. Power is 4/5 in left arm and leg. Reflexes are symmetric, no pathologic reflexes present.  Sensory: Intact to light touch Gait: hemiparetic   Labs: I have reviewed the data as listed    Component Value Date/Time   NA 135 12/19/2021 1700   K 3.7 12/19/2021 1700   CL 101 12/19/2021 1700   CO2 23 12/19/2021 1700   GLUCOSE 101 (H) 12/19/2021 1700   BUN 7 12/19/2021 1700   CREATININE 0.73 12/19/2021 1700   CALCIUM 9.3 12/19/2021 1700   PROT 7.3 03/14/2021 2255   ALBUMIN 4.3 03/14/2021 2255   AST 26 03/14/2021 2255   ALT 16 03/14/2021 2255   ALKPHOS 39 03/14/2021 2255   BILITOT 0.8 03/14/2021 2255   GFRNONAA >60 12/19/2021 1700   Lab Results  Component Value Date   WBC 6.5 12/19/2021   NEUTROABS 2.9 05/12/2021   HGB 13.4 12/19/2021   HCT 42.3 12/19/2021   MCV 99.3 12/19/2021   PLT 216 12/19/2021    Imaging: [place] Clinician Interpretation: I have personally reviewed the CNS images as listed.  My interpretation, in the context of the patient's clinical presentation, is progressive disease  CT Head Wo Contrast  Result Date: 01/13/2022 CLINICAL DATA:  41 year old female with a history of right hemisphere anaplastic astrocytoma. Increasing  headache. EXAM: CT HEAD WITHOUT CONTRAST TECHNIQUE: Contiguous axial images were obtained from the base of the skull through the vertex without intravenous contrast. RADIATION DOSE REDUCTION: This exam was performed according to the departmental dose-optimization program which includes automated exposure control, adjustment of the mA and/or kV according to patient size and/or use of iterative reconstruction technique. COMPARISON:  Head CT 12/19/2021 and earlier. FINDINGS: Brain: Heterogeneous, infiltrative right hemisphere tumor with intracranial mass effect. Leftward midline shift appears stable from last month at 11 mm on series 2, image 15. Effaced body of the right lateral ventricle, possibly with some trapping of the right atrium and occipital horn. Other low-density in the right temporal lobe is favored to be tumor and/or resection cavity related. The left  lateral ventricle and left occipital and temporal horns also appear slightly enlarged since the February MRI. Difficult to exclude mild transependymal edema. Partially effaced suprasellar cistern is stable. Other basilar cisterns remain patent. No acute intracranial hemorrhage identified. No cortically based acute infarct identified. Vascular: No suspicious intracranial vascular hyperdensity. Skull: Right lateral burr hole, likely biopsy related again noted. No acute osseous abnormality identified. Sinuses/Orbits: Visualized paranasal sinuses and mastoids are stable and well aerated. Other: Visualized orbits and scalp soft tissues are within normal limits. IMPRESSION: 1. Pronounced infiltrating tumor re-demonstrated in the right hemisphere. Subsequent intracranial mass effect is stable from last month with leftward midline shift of 11 mm. 2. 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 Difficult to exclude mild associated transependymal edema. 3. No intracranial hemorrhage or new intracranial  abnormality. Electronically Signed   By: Genevie Ann M.D.   On: 01/13/2022 09:31     Assessment/Plan Anaplastic astrocytoma [facility])  We appreciate the opportunity to participate in the care of Shanon Payor.  We are happy to provide local oncologic care for her astrocytoma treatment plan, as needed by patient and primary provider.  She is currently clinically and radiographically progressive.  Will plan to administer second cycle of avastin 91m/kg hopefully next week.  In addition she will con't on daily oral Tibsovo as prescribed by Dr. LMaylon Peppers  Additionally recommended the following for symptom burden: -Decrease decadron to 473mBID with plan to decrease further next week.  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 -Start Elavil 5033mS for headaches, sleep issues -Neuro PT referral locally -Follow through with pain medicine referral from [facility] analges[facility] overuse, sleep hygeine  Screening for potential clinical trials was performed and discussed using eligibility criteria for active protocols [facility], as well as national database available on [email or web address]  We spent twenty additional minutes teaching regarding the natural history, biology, and historical experience in the treatment of brain tumors. We then discussed in detail the current recommendations for therapy focusing on the mode of administration, mechanism of action, anticipated toxicities, and quality of life issues associated with this plan. We also provided teaching sheets for the patient to take home as an additional resource.  All questions were answered. The patient knows to call the clinic with any problems, questions or concerns. No barriers to learning were detected.  We ask that ShaGenevieve Arbaughturn to clinic in 1 week for cycle #2 avastin, or sooner as needed.  The total time spent in the encounter was 60 minutes and more than 50% was on counseling and  review of test results   ZacVentura SellersD Medical Director of Neuro-Oncology [facility] [place]/26/23 10:55 AM

## 2022-01-22 NOTE — Progress Notes (Signed)
START ON PATHWAY REGIMEN - Neuro     A cycle is every 14 days:     Bevacizumab-xxxx   **Always confirm dose/schedule in your pharmacy ordering system**  Patient Characteristics: Glioma, Grade 3 or 4 Astrocytoma, IDH-mutant, Recurrent or Progressive, Adjuvant Therapy, Systemic Therapy Candidate, BRAF V600E Mutation Negative/Unknown and NTRK Fusion Negative/Unknown Disease Classification: Glioma Disease Classification: Grade 3 or 4 Astrocytoma, IDH-mutant Disease Status: Recurrent or Progressive Treatment Classification: Adjuvant Therapy Treatment (Nonsurgical/Adjuvant): Systemic Therapy Candidate NTRK Gene Fusion Status: Negative BRAF V600E Mutation Status: Negative Intent of Therapy: Non-Curative / Palliative Intent, Discussed with Patient 

## 2022-01-22 NOTE — Telephone Encounter (Signed)
Called patient to notify of new appointments. Patient notified.  

## 2022-01-23 ENCOUNTER — Other Ambulatory Visit: Payer: Self-pay

## 2022-01-29 ENCOUNTER — Inpatient Hospital Stay: Payer: Medicaid Other | Attending: Internal Medicine | Admitting: Internal Medicine

## 2022-01-29 ENCOUNTER — Inpatient Hospital Stay: Payer: Medicaid Other

## 2022-01-29 DIAGNOSIS — R110 Nausea: Secondary | ICD-10-CM | POA: Diagnosis not present

## 2022-01-29 DIAGNOSIS — C719 Malignant neoplasm of brain, unspecified: Secondary | ICD-10-CM

## 2022-01-29 DIAGNOSIS — Z79899 Other long term (current) drug therapy: Secondary | ICD-10-CM | POA: Diagnosis not present

## 2022-01-29 DIAGNOSIS — C711 Malignant neoplasm of frontal lobe: Secondary | ICD-10-CM | POA: Diagnosis present

## 2022-01-29 DIAGNOSIS — Z5111 Encounter for antineoplastic chemotherapy: Secondary | ICD-10-CM | POA: Diagnosis present

## 2022-01-29 DIAGNOSIS — F17210 Nicotine dependence, cigarettes, uncomplicated: Secondary | ICD-10-CM | POA: Diagnosis not present

## 2022-01-29 DIAGNOSIS — R519 Headache, unspecified: Secondary | ICD-10-CM | POA: Diagnosis not present

## 2022-01-29 DIAGNOSIS — R2689 Other abnormalities of gait and mobility: Secondary | ICD-10-CM | POA: Insufficient documentation

## 2022-01-29 LAB — TOTAL PROTEIN, URINE DIPSTICK: Protein, ur: NEGATIVE mg/dL

## 2022-01-29 LAB — CBC WITH DIFFERENTIAL (CANCER CENTER ONLY)
Abs Immature Granulocytes: 0 10*3/uL (ref 0.00–0.07)
Basophils Absolute: 0 10*3/uL (ref 0.0–0.1)
Basophils Relative: 0 %
Eosinophils Absolute: 0 10*3/uL (ref 0.0–0.5)
Eosinophils Relative: 1 %
HCT: 38 % (ref 36.0–46.0)
Hemoglobin: 12.5 g/dL (ref 12.0–15.0)
Immature Granulocytes: 0 %
Lymphocytes Relative: 19 %
Lymphs Abs: 0.7 10*3/uL (ref 0.7–4.0)
MCH: 32.1 pg (ref 26.0–34.0)
MCHC: 32.9 g/dL (ref 30.0–36.0)
MCV: 97.4 fL (ref 80.0–100.0)
Monocytes Absolute: 0.5 10*3/uL (ref 0.1–1.0)
Monocytes Relative: 14 %
Neutro Abs: 2.4 10*3/uL (ref 1.7–7.7)
Neutrophils Relative %: 66 %
Platelet Count: 184 10*3/uL (ref 150–400)
RBC: 3.9 MIL/uL (ref 3.87–5.11)
RDW: 13 % (ref 11.5–15.5)
WBC Count: 3.7 10*3/uL — ABNORMAL LOW (ref 4.0–10.5)
nRBC: 0 % (ref 0.0–0.2)

## 2022-01-29 NOTE — Progress Notes (Signed)
[facility] [place], [place] [address] [phone]   Interval Evaluation  Date of Service: 01/29/22 Patient Name: Paula Farmer Patient MRN: [id number] Patient DOB: [birth year] Provider: Ventura Sellers, MD  Identifying Statement:  Paula Farmer is a 42 y.o. female with right frontal anaplastic astrocytoma who presents for initial consultation and evaluation.    Referring Provider: Ventura Sellers, MD [place],  [place] [address]  Oncologic History: Anaplastic astrocytoma ([place])  09/18/2016 Initial Diagnosis  Presented with headaches, heaviness in the legs, nausea and gait instability.  CT head: hypoattenuation in the right frontal, temporal and parietal lobes with local mass effect MRI brain: Abnormal large area of T2 hyperintensity of the right temporal and parietal lobes concerning for low-grade primary CNS neoplasm  09/19/2016 Biopsy  Right brain: anaplastic astrocytoma, WHO Grade III, IDH mutant, 1p/19q intact, MGMT methylated  10/21/2016 - 12/04/2016 Combination Chemo / RT Therapy  Radiation (59.4 Gy) and concurrent temozolomide  02/07/2017 - 09/25/2017 Chemotherapy  C1D1 adjuvant temozolomide 150 mg/m2. She only received 3 monthly cycles of TMZ secondary to noncompliance with appointments.  06/21/2020 Surgery  Stereotactic biopsy (Dr. 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   07/17/2020 - 07/17/2020 Chemotherapy  OP ONC Brain - Temozolomide (Days 1-5) Plan Provider: Erin Fulling, MD Treatment goal: Adjuvant Line of treatment: Adjuvant  09/05/2020 Genetic Test  The patient had genetic testing of her tumor through [facility] which reported on this day. Her tumor was found to be MGMT promoter methylated and IDH 1 mutated but did not have other targetable mutations.  10/12/2020 - 11/24/2020 Chemotherapy  She began cycle 1  of lomustine on 11/06/2020. She received a total dose of 130 mg which was approximately 80 mg per metered squared. She was to take one 100 mg capsule and three 10 mg capsules. She did eventually complete 2 cycles of the lomustine.  OP ONC brain lomustine Plan Provider: Erin Fulling, MD Treatment goal: Adjuvant Line of treatment: Adjuvant  03/15/2021 Progression  Imaging evidence of progression along with worsening symptoms of headaches and seizures.  04/16/2021 - Chemotherapy  C1-3/20; C2-4/3; C3 4/17; C4 5/1; C5 5/15; C6 06/26/21; C7 07/12/21; C8 08/02/21 OP ONC Brain - Bevacizumab Plan Provider: Erin Fulling, MD Treatment goal: Control Line of treatment: [No plan line of treatment]  05/02/2021 - 06/06/2021 Radiation Therapy  Repeat simulation for radiation for recurrent disease. Tolerated well with increased headaches requiring steroids in latter part of treatment.   63/78/5885 Complications  Patient was hospitalized on the inpatient oncology service after being admitted for progressive headaches and left hemiparesis. She responded somewhat to increases in her steroids after imaging confirmed progressive disease. After discussion with family, she wished to try the IDH inhibitor ivosidenib and we were able to obtain this for her which she started at the time of discharge on 12/26/2021.  12/26/2021 - Chemotherapy  Patient began oral ivosidenib therapy 250 mg tablets, 2 daily. We also elected to restart her bevacizumab initially at a dose of 5 mg/kg which will be dose escalated to 10 mg/kg with subsequent treatments.   Biomarkers:  MGMT Methylated.  IDH 1/2 Mutated.  EGFR Unknown  1p/19q Wild type   Interval History: Paula Farmer presents today for avastin infusion.  She has actually discontinued the decadron since last Thursday without ill effect.  Her leg pain has actually improved somewhat, relying less already on pain  medication.  Sleep has improved as well.  No other new  or progressive changes.  H+P (01/22/22) presents today for evaluation, plan for avastin therapy at request of her primary oncologist, Dr. Maylon Peppers.  She continues to complain of diffuse pain, primarily in "leg muscles", but also in right knee.  More recently has complained of headaches which now occur every night.  She is minimally active at home, does walk with a cane.  No recent PT.  Sleep is very poor and interrupted.  Decadron currently at 21m daily.   Medications: Current Outpatient Medications on File Prior to Visit  Medication Sig Dispense Refill   amitriptyline (ELAVIL) 50 MG tablet Take 1 tablet (50 mg total) by mouth at bedtime. 60 tablet 1   clindamycin (CLEOCIN) 150 MG capsule Take 2 capsules (300 mg total) by mouth 3 (three) times daily. 63 capsule 0   [employer] SENNA PLUS 8.6-50 MG tablet Take 2 tablets by mouth daily as needed for moderate constipation.     erythromycin ophthalmic ointment Place 1 application. into both eyes at bedtime.     HYDROcodone-acetaminophen (NORCO) 5-325 MG tablet Take 1-2 tablets by mouth every 4 (four) hours as needed. 20 tablet 0   ibuprofen (ADVIL) 600 MG tablet Take 600 mg by mouth every 6 (six) hours as needed for moderate pain.     ivosidenib (TIBSOVO) 250 MG tablet Take 500 mg by mouth daily.     levETIRAcetam (KEPPRA) 1000 MG tablet Take 1,000 mg by mouth 2 (two) times daily.     levETIRAcetam (KEPPRA) 500 MG tablet Take 500 mg by mouth 2 (two) times daily.     LORazepam (ATIVAN) 1 MG tablet Take 1 tablet (1 mg total) by mouth 3 (three) times daily as needed for anxiety. (Patient taking differently: Take 0.5-1 mg by mouth 3 (three) times daily as needed for anxiety.) 15 tablet 0   methocarbamol (ROBAXIN) 500 MG tablet Take 500 mg by mouth 3 (three) times daily as needed for muscle spasms.     Multiple Vitamins-Minerals (HM MULTIVITAMIN ADULT GUMMY PO) Take 2 tablets by mouth daily.     omeprazole (PRILOSEC) 20 MG capsule Take 20 mg by mouth daily.      ondansetron (ZOFRAN) 8 MG tablet Take 8 mg by mouth every 8 (eight) hours as needed for nausea or vomiting.     Oxycodone HCl 10 MG TABS Take 1 tablet by mouth every 4 (four) hours as needed.     dexamethasone (DECADRON) 4 MG tablet Take 6.5 mg by mouth daily. Take 1.5 tablets (6 mg) daily (Patient not taking: Reported on 01/29/2022)     No current facility-administered medications on file prior to visit.    Allergies: No Known Allergies Past Medical History:  Past Medical History:  Diagnosis Date   Cancer ([facility]    Seizures ([facility]    Past Surgical History: No past surgical history on file. Social History:  Social History   Socioeconomic History   Marital status: Single    Spouse name: Not on file   Number of children: Not on file   Years of education: Not on file   Highest education level: Not on file  Occupational History   Not on file  Tobacco Use   Smoking status: Some Days    Packs/day: 0.25    Years: 10.00    Total pack years: 2.50    Types: Cigarettes   Smokeless tobacco: Never  Vaping Use   Vaping Use: Never used  Substance  and Sexual Activity   Alcohol use: Not Currently    Alcohol/week: 2.0 standard drinks of alcohol    Types: 2 Glasses of wine per week    Comment: drinks wine twice a month   Drug use: Not Currently   Sexual activity: Not Currently  Other Topics Concern   Not on file  Social History Narrative   Not on file   Social Determinants of Health   Financial Resource Strain: Not on file  Food Insecurity: Not on file  Transportation Needs: Not on file  Physical Activity: Not on file  Stress: Not on file  Social Connections: Not on file  Intimate Partner Violence: Not on file   Family History:  Family History  Problem Relation Age of Onset   Cancer Mother     Review of Systems: Constitutional: Doesn't report fevers, chills or abnormal weight loss Eyes: Doesn't report blurriness of vision Ears, nose, mouth, throat, and face: Doesn't report  sore throat Respiratory: Doesn't report cough, dyspnea or wheezes Cardiovascular: Doesn't report palpitation, chest discomfort  Gastrointestinal:  Doesn't report nausea, constipation, diarrhea GU: Doesn't report incontinence Skin: Doesn't report skin rashes Neurological: Per HPI Musculoskeletal: Doesn't report joint pain Behavioral/Psych: Doesn't report anxiety  Physical Exam: Vitals:   01/29/22 1115  BP: 120/75  Pulse: 88  Resp: 16  Temp: 97.9 F (36.6 C)  SpO2: 99%   KPS: 60. General: Alert, cooperative, pleasant, in no acute distress Head: Normal EENT: No conjunctival injection or scleral icterus.  Lungs: Resp effort normal Cardiac: Regular rate Abdomen: Non-distended abdomen Skin: No rashes cyanosis or petechiae. Extremities: No clubbing or edema  Neurologic Exam: Mental Status: Awake, alert, attentive to examiner. Oriented to self and environment. Language is fluent with intact comprehension.  Cranial Nerves: Visual acuity is grossly normal. Visual fields are full. Extra-ocular movements intact. No ptosis. Face is symmetric Motor: Tone and bulk are normal. Power is 4/5 in left arm and leg. Reflexes are symmetric, no pathologic reflexes present.  Sensory: Intact to light touch Gait: hemiparetic   Labs: I have reviewed the data as listed    Component Value Date/Time   NA 135 12/19/2021 1700   K 3.7 12/19/2021 1700   CL 101 12/19/2021 1700   CO2 23 12/19/2021 1700   GLUCOSE 101 (H) 12/19/2021 1700   BUN 7 12/19/2021 1700   CREATININE 0.73 12/19/2021 1700   CALCIUM 9.3 12/19/2021 1700   PROT 7.3 03/14/2021 2255   ALBUMIN 4.3 03/14/2021 2255   AST 26 03/14/2021 2255   ALT 16 03/14/2021 2255   ALKPHOS 39 03/14/2021 2255   BILITOT 0.8 03/14/2021 2255   GFRNONAA >60 12/19/2021 1700   Lab Results  Component Value Date   WBC 3.7 (L) 01/29/2022   NEUTROABS 2.4 01/29/2022   HGB 12.5 01/29/2022   HCT 38.0 01/29/2022   MCV 97.4 01/29/2022   PLT 184 01/29/2022     Imaging: [facility] Clinician Interpretation: I have personally reviewed the CNS images as listed.  My interpretation, in the context of the patient's clinical presentation, is progressive disease  CT Head Wo Contrast  Result Date: 01/13/2022 CLINICAL DATA:  42 year old female with a history of right hemisphere anaplastic astrocytoma. Increasing headache. EXAM: CT HEAD WITHOUT CONTRAST TECHNIQUE: Contiguous axial images were obtained from the base of the skull through the vertex without intravenous contrast. RADIATION DOSE REDUCTION: This exam was performed according to the departmental dose-optimization program which includes automated exposure control, adjustment of the mA and/or kV according to patient size  and/or use of iterative reconstruction technique. COMPARISON:  Head CT 12/19/2021 and earlier. FINDINGS: Brain: Heterogeneous, infiltrative right hemisphere tumor with intracranial mass effect. Leftward midline shift appears stable from last month at 11 mm on series 2, image 15. Effaced body of the right lateral ventricle, possibly with some trapping of the right atrium and occipital horn. Other low-density in the right temporal lobe is favored to be tumor and/or resection cavity related. The left lateral ventricle and left occipital and temporal horns also appear slightly enlarged since the February MRI. Difficult to exclude mild transependymal edema. Partially effaced suprasellar cistern is stable. Other basilar cisterns remain patent. No acute intracranial hemorrhage identified. No cortically based acute infarct identified. Vascular: No suspicious intracranial vascular hyperdensity. Skull: Right lateral burr hole, likely biopsy related again noted. No acute osseous abnormality identified. Sinuses/Orbits: Visualized paranasal sinuses and mastoids are stable and well aerated. Other: Visualized orbits and scalp soft tissues are within normal limits. IMPRESSION: 1. Pronounced infiltrating tumor  re-demonstrated in the right hemisphere. Subsequent intracranial mass effect is stable from last month with leftward midline shift of 11 mm. 2. 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 Difficult to exclude mild associated transependymal edema. 3. No intracranial hemorrhage or new intracranial abnormality. Electronically Signed   By: Genevie Ann M.D.   On: 01/13/2022 09:31     Assessment/Plan Anaplastic astrocytoma [facility]) - Plan: Clinic Appointment Request  Verlaine Embry is clinically stable today, labs are within normal limits.    She is cleared to proceed with cycle #2 avastin 34m/kg IV q2 weeks.   In addition she will con't on daily oral Tibsovo as prescribed by Dr. LMaylon Peppers  Now that avastin on-board, ok to remain off decadron if tolerated.  Neuro PT referral locally  All questions were answered. The patient knows to call the clinic with any problems, questions or concerns. No barriers to learning were detected.  We ask that SLavaughn Bisigreturn to clinic in 2 weeks for cycle #3 avastin, or sooner as needed.  The total time spent in the encounter was 30 minutes and more than 50% was on counseling and review of test results   ZVentura Sellers MD Medical Director of Neuro-Oncology [facility]01/02/24 11:27 AM

## 2022-01-30 ENCOUNTER — Other Ambulatory Visit: Payer: Self-pay

## 2022-01-30 ENCOUNTER — Telehealth: Payer: Self-pay | Admitting: Internal Medicine

## 2022-01-30 ENCOUNTER — Inpatient Hospital Stay: Payer: Medicaid Other

## 2022-01-30 VITALS — BP 121/83 | HR 91 | Temp 98.4°F | Resp 16 | Wt 164.0 lb

## 2022-01-30 DIAGNOSIS — Z5111 Encounter for antineoplastic chemotherapy: Secondary | ICD-10-CM | POA: Diagnosis not present

## 2022-01-30 DIAGNOSIS — C719 Malignant neoplasm of brain, unspecified: Secondary | ICD-10-CM

## 2022-01-30 MED ORDER — SODIUM CHLORIDE 0.9 % IV SOLN
10.0000 mg/kg | Freq: Once | INTRAVENOUS | Status: AC
  Administered 2022-01-30: 700 mg via INTRAVENOUS
  Filled 2022-01-30: qty 16

## 2022-01-30 MED ORDER — SODIUM CHLORIDE 0.9 % IV SOLN: Freq: Once | INTRAVENOUS | Status: AC

## 2022-01-30 MED ORDER — HYDROCODONE-ACETAMINOPHEN 5-325 MG PO TABS
1.0000 | ORAL_TABLET | Freq: Once | ORAL | Status: AC
  Administered 2022-01-30: 1 via ORAL
  Filled 2022-01-30: qty 1

## 2022-01-30 MED ORDER — SODIUM CHLORIDE 0.9 % IV SOLN: 10.0000 mg/kg | Freq: Once | INTRAVENOUS | Status: DC

## 2022-01-30 NOTE — Progress Notes (Signed)
The following biosimilar Vegzelma (bevacizumab-adcd)  has been selected for use in this patient.   Medicaid approved.  Henreitta Leber, PharmD  Pharmacist Chemotherapy Monitoring - Initial Assessment    Anticipated start date: 01/30/22   The following has been reviewed per standard work regarding the patient's treatment regimen: The patient's diagnosis, treatment plan and drug doses, and organ/hematologic function Lab orders and baseline tests specific to treatment regimen  The treatment plan start date, drug sequencing, and pre-medications Prior authorization status  Patient's documented medication list, including drug-drug interaction screen and prescriptions for anti-emetics and supportive care specific to the treatment regimen The drug concentrations, fluid compatibility, administration routes, and timing of the medications to be used The patient's access for treatment and lifetime cumulative dose history, if applicable  The patient's medication allergies and previous infusion related reactions, if applicable   Changes made to treatment plan:  switch to insurance preferred biosimilar  Follow up needed:  N/A   Wynona Neat, [facility], 01/30/2022  9:04 AM

## 2022-01-30 NOTE — Patient Instructions (Signed)
[facility]  Discharge Instructions: Thank you for choosing [facility] to provide your oncology and hematology care.   If you have a lab appointment with the [facility], please go directly to the [facility] and check in at the registration area.   Wear comfortable clothing and clothing appropriate for easy access to any Portacath or PICC line.   We strive to give you quality time with your provider. You may need to reschedule your appointment if you arrive late (15 or more minutes).  Arriving late affects you and other patients whose appointments are after yours.  Also, if you miss three or more appointments without notifying the office, you may be dismissed from the clinic at the provider's discretion.      For prescription refill requests, have your pharmacy contact our office and allow 72 hours for refills to be completed.    Today you received the following chemotherapy and/or immunotherapy agents : Bevacizumab      To help prevent nausea and vomiting after your treatment, we encourage you to take your nausea medication as directed.  BELOW ARE SYMPTOMS THAT SHOULD BE REPORTED IMMEDIATELY: *FEVER GREATER THAN 100.4 F (38 C) OR HIGHER *CHILLS OR SWEATING *NAUSEA AND VOMITING THAT IS NOT CONTROLLED WITH YOUR NAUSEA MEDICATION *UNUSUAL SHORTNESS OF BREATH *UNUSUAL BRUISING OR BLEEDING *URINARY PROBLEMS (pain or burning when urinating, or frequent urination) *BOWEL PROBLEMS (unusual diarrhea, constipation, pain near the anus) TENDERNESS IN MOUTH AND THROAT WITH OR WITHOUT PRESENCE OF ULCERS (sore throat, sores in mouth, or a toothache) UNUSUAL RASH, SWELLING OR PAIN  UNUSUAL VAGINAL DISCHARGE OR ITCHING   Items with * indicate a potential emergency and should be followed up as soon as possible or go to the Emergency Department if any problems should occur.  Please show the CHEMOTHERAPY ALERT CARD or IMMUNOTHERAPY ALERT CARD at check-in  to the Emergency Department and triage nurse.  Should you have questions after your visit or need to cancel or reschedule your appointment, please contact [facility]  Dept: [phone]  and follow the prompts.  Office hours are 8:00 a.m. to 4:30 p.m. Monday - Friday. Please note that voicemails left after 4:00 p.m. may not be returned until the following business day.  We are closed weekends and major holidays. You have access to a nurse at all times for urgent questions. Please call the main number to the clinic Dept: [phone] and follow the prompts.   For any non-urgent questions, you may also contact your provider using MyChart. We now offer e-Visits for anyone 50 and older to request care online for non-urgent symptoms. For details visit [email or web address].   Also download the MyChart app! Go to the app store, search "MyChart", open the app, select [facility], and log in with your MyChart username and password.  Bevacizumab Injection What is this medication? BEVACIZUMAB (be va SIZ yoo mab) treats some types of cancer. It works by blocking a protein that causes cancer cells to grow and multiply. This helps to slow or stop the spread of cancer cells. It is a monoclonal antibody. This medicine may be used for other purposes; ask your health care provider or pharmacist if you have questions. COMMON BRAND NAME(S): Alymsys, Avastin, MVASI, Noah Charon What should I tell my care team before I take this medication? They need to know if you have any of these conditions: Blood clots Coughing up blood Having or recent surgery Heart failure High blood  pressure History of a connection between 2 or more body parts that do not usually connect (fistula) History of a tear in your stomach or intestines Protein in your urine An unusual or allergic reaction to bevacizumab, other medications, foods, dyes, or preservatives Pregnant or trying to get  pregnant Breast-feeding How should I use this medication? This medication is injected into a vein. It is given by your care team in a hospital or clinic setting. Talk to your care team the use of this medication in children. Special care may be needed. Overdosage: If you think you have taken too much of this medicine contact a poison control center or emergency room at once. NOTE: This medicine is only for you. Do not share this medicine with others. What if I miss a dose? Keep appointments for follow-up doses. It is important not to miss your dose. Call your care team if you are unable to keep an appointment. What may interact with this medication? Interactions are not expected. This list may not describe all possible interactions. Give your health care provider a list of all the medicines, herbs, non-prescription drugs, or dietary supplements you use. Also tell them if you smoke, drink alcohol, or use illegal drugs. Some items may interact with your medicine. What should I watch for while using this medication? Your condition will be monitored carefully while you are receiving this medication. You may need blood work while taking this medication. This medication may make you feel generally unwell. This is not uncommon as chemotherapy can affect healthy cells as well as cancer cells. Report any side effects. Continue your course of treatment even though you feel ill unless your care team tells you to stop. This medication may increase your risk to bruise or bleed. Call your care team if you notice any unusual bleeding. Before having surgery, talk to your care team to make sure it is ok. This medication can increase the risk of poor healing of your surgical site or wound. You will need to stop this medication for 28 days before surgery. After surgery, wait at least 28 days before restarting this medication. Make sure the surgical site or wound is healed enough before restarting this medication. Talk  to your care team if questions. Talk to your care team if you may be pregnant. Serious birth defects can occur if you take this medication during pregnancy and for 6 months after the last dose. Contraception is recommended while taking this medication and for 6 months after the last dose. Your care team can help you find the option that works for you. Do not breastfeed while taking this medication and for 6 months after the last dose. This medication can cause infertility. Talk to your care team if you are concerned about your fertility. What side effects may I notice from receiving this medication? Side effects that you should report to your care team as soon as possible: Allergic reactions--skin rash, itching, hives, swelling of the face, lips, tongue, or throat Bleeding--bloody or black, tar-like stools, vomiting blood or brown material that looks like coffee grounds, red or dark brown urine, small red or purple spots on skin, unusual bruising or bleeding Blood clot--pain, swelling, or warmth in the leg, shortness of breath, chest pain Heart attack--pain or tightness in the chest, shoulders, arms, or jaw, nausea, shortness of breath, cold or clammy skin, feeling faint or lightheaded Heart failure--shortness of breath, swelling of the ankles, feet, or hands, sudden weight gain, unusual weakness or fatigue Increase in  blood pressure Infection--fever, chills, cough, sore throat, wounds that don't heal, pain or trouble when passing urine, general feeling of discomfort or being unwell Infusion reactions--chest pain, shortness of breath or trouble breathing, feeling faint or lightheaded Kidney injury--decrease in the amount of urine, swelling of the ankles, hands, or feet Stomach pain that is severe, does not go away, or gets worse Stroke--sudden numbness or weakness of the face, arm, or leg, trouble speaking, confusion, trouble walking, loss of balance or coordination, dizziness, severe headache,  change in vision Sudden and severe headache, confusion, change in vision, seizures, which may be signs of posterior reversible encephalopathy syndrome (PRES) Side effects that usually do not require medical attention (report to your care team if they continue or are bothersome): Back pain Change in taste Diarrhea Dry skin Increased tears Nosebleed This list may not describe all possible side effects. Call your doctor for medical advice about side effects. You may report side effects to FDA at 1-800-FDA-1088. Where should I keep my medication? This medication is given in a hospital or clinic. It will not be stored at home. NOTE: This sheet is a summary. It may not cover all possible information. If you have questions about this medicine, talk to your doctor, pharmacist, or health care provider.  2023 Elsevier/Gold Standard (2021-05-18 00:00:00)

## 2022-01-30 NOTE — Progress Notes (Signed)
Okay to treat with pulse of 105 per Dr. Mickeal Skinner and due to c/o headache okay to admin 1 norco tab here per Dr. Mickeal Skinner.

## 2022-01-30 NOTE — Telephone Encounter (Signed)
Called patient to schedule next appointment. Patient notified.

## 2022-02-01 ENCOUNTER — Ambulatory Visit: Payer: Medicaid Other | Attending: Internal Medicine | Admitting: Physical Therapy

## 2022-02-01 DIAGNOSIS — R2681 Unsteadiness on feet: Secondary | ICD-10-CM | POA: Insufficient documentation

## 2022-02-01 DIAGNOSIS — R2689 Other abnormalities of gait and mobility: Secondary | ICD-10-CM | POA: Insufficient documentation

## 2022-02-01 DIAGNOSIS — M6281 Muscle weakness (generalized): Secondary | ICD-10-CM | POA: Insufficient documentation

## 2022-02-01 NOTE — Therapy (Incomplete)
OUTPATIENT PHYSICAL THERAPY NEURO EVALUATION   Patient Name: Paula Farmer MRN: [id number] DOB:[birth year], 42 y.o., female Today's Date: 02/01/2022   PCP: Marland Kitchen REFERRING PROVIDER: Ventura Sellers, MD  END OF SESSION:   Past Medical History:  Diagnosis Date   Cancer ([place])    Seizures ([place])    No past surgical history on file. 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  Rationale for Evaluation and Treatment: Rehabilitation  SUBJECTIVE:                                                                                                                                                                                             SUBJECTIVE STATEMENT: ***  Pt accompanied by: {accompnied:27141}  PERTINENT HISTORY: history of astrocytoma of the right hemisphere, anxiety (see MD note from 01/22/22 for Oncologic history)  PAIN:  Are you having pain? {OPRCPAIN:27236}  PRECAUTIONS: {Therapy precautions:24002}  WEIGHT BEARING RESTRICTIONS: {Yes ***/No:24003}  FALLS: Has patient fallen in last 6 months? {fallsyesno:27318}  LIVING ENVIRONMENT: Lives with: {OPRC lives with:25569::"lives with their family"} Lives in: {Lives in:25570} Stairs: {opstairs:27293} Has following equipment at home: {Assistive devices:23999}  PLOF: {PLOF:24004}  PATIENT GOALS: ***  OBJECTIVE:   DIAGNOSTIC FINDINGS:  Head CT 2022/02/10 IMPRESSION: 1. Pronounced infiltrating tumor re-demonstrated in the right hemisphere. Subsequent intracranial mass effect is stable from last month with leftward midline shift of 11 mm.   2. 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 Difficult to exclude mild associated transependymal edema.   3. No intracranial hemorrhage or new intracranial abnormality.  COGNITION: Overall cognitive status: {cognition:24006}   SENSATION: {sensation:27233}  COORDINATION: ***  EDEMA:  {edema:24020}  MUSCLE TONE: {LE tone:25568}  MUSCLE LENGTH: Hamstrings: Right *** deg; Left *** deg Marcello Moores test: Right *** deg; Left *** deg  DTRs:  {DTR SITE:24025}  POSTURE: {posture:25561}  LOWER EXTREMITY ROM:     {AROM/PROM:27142}  Right Eval Left Eval  Hip flexion    Hip extension    Hip abduction    Hip adduction    Hip internal rotation    Hip external rotation    Knee flexion    Knee extension    Ankle dorsiflexion    Ankle plantarflexion    Ankle inversion    Ankle eversion     (Blank  rows = not tested)  LOWER EXTREMITY MMT:    MMT Right Eval Left Eval  Hip flexion    Hip extension    Hip abduction    Hip adduction    Hip internal rotation    Hip external rotation    Knee flexion    Knee extension    Ankle dorsiflexion    Ankle plantarflexion    Ankle inversion    Ankle eversion    (Blank rows = not tested)  BED MOBILITY:  {Bed mobility:24027}  TRANSFERS: Assistive device utilized: {Assistive devices:23999}  Sit to stand: {Levels of assistance:24026} Stand to sit: {Levels of assistance:24026} Chair to chair: {Levels of assistance:24026} Floor: {Levels of assistance:24026}  RAMP:  Level of Assistance: {Levels of assistance:24026} Assistive device utilized: {Assistive devices:23999} Ramp Comments: ***  CURB:  Level of Assistance: {Levels of assistance:24026} Assistive device utilized: {Assistive devices:23999} Curb Comments: ***  STAIRS: Level of Assistance: {Levels of assistance:24026} Stair Negotiation Technique: {Stair Technique:27161} with {Rail Assistance:27162} Number of Stairs: ***  Height of Stairs: ***  Comments:  ***  GAIT: Gait pattern: {gait characteristics:25376} Distance walked: *** Assistive device utilized: {Assistive devices:23999} Level of assistance: {Levels of assistance:24026} Comments: ***  FUNCTIONAL TESTS:  {Functional tests:24029}  PATIENT SURVEYS:  {rehab surveys:24030}  TODAY'S TREATMENT:                                                                                                                              ***    PATIENT EDUCATION: Education details: *** Person educated: {Person educated:25204} Education method: {Education Method:25205} Education comprehension: {Education Comprehension:25206}  HOME EXERCISE PROGRAM: ***  GOALS: Goals reviewed with patient? {yes/no:20286}  SHORT TERM GOALS: Target date: ***  *** Baseline: Goal status: {GOALSTATUS:25110}  2.  *** Baseline:  Goal status: {GOALSTATUS:25110}  3.  *** Baseline:  Goal status: {GOALSTATUS:25110}  4.  *** Baseline:  Goal status: {GOALSTATUS:25110}  5.  *** Baseline:  Goal status: {GOALSTATUS:25110}  6.  *** Baseline:  Goal status: {GOALSTATUS:25110}  LONG TERM GOALS: Target date: ***  *** Baseline:  Goal status: {GOALSTATUS:25110}  2.  *** Baseline:  Goal status: {GOALSTATUS:25110}  3.  *** Baseline:  Goal status: {GOALSTATUS:25110}  4.  *** Baseline:  Goal status: {GOALSTATUS:25110}  5.  *** Baseline:  Goal status: {GOALSTATUS:25110}  6.  *** Baseline:  Goal status: {GOALSTATUS:25110}  ASSESSMENT:  CLINICAL IMPRESSION: Patient is a *** year old *** referred to Neuro OPPT for***.   Pt's PMH is significant for: *** The following deficits were present during the exam: ***. Based on ***, pt is an incr risk for falls. Pt would benefit from skilled PT to address these impairments and functional limitations to maximize functional mobility independence   OBJECTIVE IMPAIRMENTS: {opptimpairments:25111}.   ACTIVITY LIMITATIONS:  {activitylimitations:27494}  PARTICIPATION LIMITATIONS: {participationrestrictions:25113}  PERSONAL FACTORS: {Personal factors:25162} are also affecting patient's functional outcome.   REHAB POTENTIAL: {rehabpotential:25112}  CLINICAL DECISION MAKING: {clinical decision making:25114}  EVALUATION COMPLEXITY: {Evaluation complexity:25115}  PLAN:  PT FREQUENCY: {rehab frequency:25116}  PT DURATION: {rehab duration:25117}  PLANNED INTERVENTIONS: {rehab planned interventions:25118::"Therapeutic exercises","Therapeutic activity","Neuromuscular re-education","Balance training","Gait training","Patient/Family education","Self Care","Joint mobilization"}  PLAN FOR NEXT SESSION: ***   Excell Seltzer, PT, DPT, CSRS 02/01/2022, 8:26 AM

## 2022-02-04 ENCOUNTER — Ambulatory Visit: Payer: Medicaid Other

## 2022-02-08 ENCOUNTER — Ambulatory Visit: Payer: Medicaid Other | Admitting: Physical Therapy

## 2022-02-08 DIAGNOSIS — R2689 Other abnormalities of gait and mobility: Secondary | ICD-10-CM

## 2022-02-08 DIAGNOSIS — R2681 Unsteadiness on feet: Secondary | ICD-10-CM

## 2022-02-08 DIAGNOSIS — M6281 Muscle weakness (generalized): Secondary | ICD-10-CM

## 2022-02-08 NOTE — Therapy (Addendum)
OUTPATIENT PHYSICAL THERAPY NEURO EVALUATION   Patient Name: Paula Farmer MRN: [id number] DOB:[birth year], 42 y.o., female Today's Date: 02/08/2022   PCP: [place] PROVIDER: Ventura Sellers, MD  END OF SESSION:  PT End of Session - 02/08/22 1116     Visit Number 1    Number of Visits 5   Plus eval   Date for PT Re-Evaluation 03/15/22    Authorization Type [employer] (27 visits combined w/PT, OT, SLP)    Authorization - Number of Visits --    PT Start Time 1114   Pt arrived late   PT Stop Time 1145    PT Time Calculation (min) 31 min    Activity Tolerance Patient limited by pain    Behavior During Therapy Flat affect             Past Medical History:  Diagnosis Date   Cancer ([place])    Seizures ([place])    No past surgical history on file. 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 Pt reports having weakness in her left leg, "my knee pops a lot". Pt reports her knee started popping about a year ago after having to walk a long way. Denies a fall, twist or hearing a pop. Had an X-ray performed but was unremarkable. Had a bad fall in October where she fell in the bathroom and hit her head on the toilet and on the bathroom door. No falls  since. Had a surgery on her R shoulder last year for rotator cuff repair and reports her LUE is weak now.    Pt accompanied by:  Mom, Vera   PERTINENT HISTORY:  anaplastic astrocytoma  PAIN:  Are you having pain? Yes: NPRS scale: 10/10 Pain location: entire body Pain description: achy/throbby  Aggravating factors: walking around, being unable to stretch/lay down Relieving factors: Oxy   PRECAUTIONS: Fall  WEIGHT BEARING RESTRICTIONS: No  FALLS: Has patient fallen  in last 6 months? Yes. Number of falls 1   LIVING ENVIRONMENT: Lives with: lives with their family Lives in: House/apartment Stairs: Yes: External: 12 + 6 steps; can reach both and does not use as they are wooden and give her splinters  Has following equipment at home: Single point cane and Walker - 2 wheeled  PLOF: Requires assistive device for independence, Needs assistance with ADLs, and receiving assistance from mother and kids  PATIENT GOALS: "A little bit more strength in my legs and my arms"   OBJECTIVE:   DIAGNOSTIC FINDINGS: CT of head on February 04, 2022  IMPRESSION: 1. Pronounced infiltrating tumor re-demonstrated in the right hemisphere. Subsequent intracranial mass effect is stable from last month with leftward midline shift of 11 mm.   2. 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 Difficult to exclude mild associated transependymal edema.   3. No intracranial hemorrhage or new intracranial abnormality.  COGNITION: Overall cognitive status: Impaired   SENSATION: Pt denies numbness/tingling in BLEs  EDEMA: Pt reports frequent swelling of R knee    POSTURE: No Significant postural limitations  LOWER EXTREMITY MMT:  To be assessed  MMT Right Eval Left Eval  Hip flexion    Hip extension    Hip abduction    Hip adduction    Hip internal rotation    Hip external rotation    Knee flexion    Knee extension    Ankle dorsiflexion     Ankle plantarflexion    Ankle inversion    Ankle eversion    (Blank rows = not tested)  BED MOBILITY:  Pt and mom report difficulty going from supine <>sit due to BUE weakness and pain   TRANSFERS: Assistive device utilized: Single point cane  Sit to stand: SBA Stand to sit: SBA Heavy reliance on BUE support, difficulty obtaining full knee extension in stance   GAIT: Gait pattern: step to pattern, decreased step length- Right, decreased step length- Left, decreased stride length, decreased hip/knee flexion- Left, decreased ankle dorsiflexion- Left, antalgic, narrow BOS, and poor foot clearance- Left Distance walked: Various clinic distances  Assistive device utilized: Single point cane Level of assistance: CGA Comments: Pt holds cane in various positions on R side. Pt demonstrates dragging of LLE, frequently catching foot on floor.   FUNCTIONAL TESTS:   [facility] PT Assessment - 02/08/22 1137       Transfers   Five time sit to stand comments  38.25s w/BUE support   Pt unable to obtain full knee extension at top of rep     Ambulation/Gait   Gait velocity 32.8' over 18.72s = 1.75 ft/s with SPC on R side   Noted dragging of LLE             TODAY'S TREATMENT:       Next session  PATIENT EDUCATION: Education details: POC, eval findings, 27 visit limit  Person educated: Patient and Parent Education method: Explanation, Demonstration, and Verbal cues Education comprehension: verbalized understanding and needs further education  HOME EXERCISE PROGRAM: To be established   GOALS: Goals reviewed with patient? Yes  SHORT TERM GOALS = LONG TERM GOALS DUE TO POC LENGTH: Target date: 03/08/2022   Pt will be independent with final HEP for improved strength, transfers and gait.  Baseline: Not established on eval Goal status: INITIAL  2.  Pt will  improve 5 x STS to less than or equal to 25 seconds w/BUE support to demonstrate improved functional strength and transfer efficiency.    Baseline: 38.25s w/BUE support  Goal status: INITIAL  3.  Pt will improve gait velocity to at least 2.0 ft/s w/LRAD for improved gait efficiency   Baseline: 1.75 ft/s with SPC Goal status: INITIAL  4.  Pt will trial various AFOs on LLE for improved safety w/gait.  Baseline:  Goal status: INITIAL   ASSESSMENT:  CLINICAL IMPRESSION: Patient is a 42 year old female referred to Neuro OPPT for anaplastic astrocytoma. Pt's PMH is significant for: anaplastic astrocytoma. The following deficits were present during the exam: decreased strength, severe pain, impaired balance, decreased activity tolerance and impaired safety awareness. Based on 5x STS and gait speed, pt is an incr risk for falls. Pt would benefit from skilled PT to address these impairments and functional limitations to maximize functional mobility independence.    OBJECTIVE IMPAIRMENTS: Abnormal gait, decreased activity tolerance, decreased balance, decreased cognition, decreased coordination, decreased endurance, decreased knowledge of condition, decreased knowledge of use of DME, decreased mobility, difficulty walking, decreased strength, decreased safety awareness, dizziness, impaired sensation, impaired UE functional use, and pain  ACTIVITY LIMITATIONS: carrying, lifting, bending, standing, squatting, sleeping, stairs, transfers, bed mobility, locomotion level, and caring for others  PARTICIPATION LIMITATIONS: meal prep, cleaning, laundry, medication management, interpersonal relationship, driving, shopping, community activity, and occupation  PERSONAL FACTORS: Behavior pattern, Fitness, Past/current experiences, and 1 comorbidity: anaplastic astrocytoma  are also affecting patient's functional outcome.   REHAB POTENTIAL: Good  CLINICAL DECISION MAKING: Evolving/moderate  complexity  EVALUATION COMPLEXITY: Moderate  PLAN:  PT FREQUENCY: 1x/week  PT DURATION: 4 weeks  PLANNED INTERVENTIONS: Therapeutic exercises, Therapeutic activity, Neuromuscular re-education, Balance training, Gait training, Patient/Family education, Self Care, Joint mobilization, Stair training, Orthotic/Fit training, DME instructions, Aquatic Therapy, Dry Needling, Manual therapy, and Re-evaluation  PLAN FOR NEXT SESSION: MMT, establish initial HEP for BLE strength (LLE >RLE) - quads and glutes, trial AFO or foot up on LLE?   Check all possible CPT codes: 95188 - PT Re-evaluation, 97110- Therapeutic Exercise, [phone]- Neuro Re-education, [phone] - Gait Training, [phone] - Manual Therapy, [phone] - Therapeutic Activities, [phone] - Self Care, and [phone] - Orthotic Fit    Check all conditions that are expected to impact treatment: Ongoing dialysis or cancer treatment   If treatment provided at initial evaluation, no treatment charged due to lack of authorization.    Cruzita Lederer Jennalee Greaves, PT, DPT 02/08/2022, 11:50 AM

## 2022-02-09 ENCOUNTER — Other Ambulatory Visit: Payer: Self-pay

## 2022-02-12 ENCOUNTER — Emergency Department (HOSPITAL_COMMUNITY): Payer: Medicaid Other

## 2022-02-12 ENCOUNTER — Emergency Department (HOSPITAL_COMMUNITY)
Admission: EM | Admit: 2022-02-12 | Discharge: 2022-02-12 | Disposition: A | Discharge status: Home / Self Care | Payer: Medicaid Other | Attending: Emergency Medicine | Admitting: Emergency Medicine

## 2022-02-12 ENCOUNTER — Other Ambulatory Visit: Payer: Self-pay

## 2022-02-12 DIAGNOSIS — R0789 Other chest pain: Secondary | ICD-10-CM | POA: Insufficient documentation

## 2022-02-12 DIAGNOSIS — M542 Cervicalgia: Secondary | ICD-10-CM | POA: Diagnosis not present

## 2022-02-12 DIAGNOSIS — M546 Pain in thoracic spine: Secondary | ICD-10-CM | POA: Diagnosis not present

## 2022-02-12 DIAGNOSIS — C719 Malignant neoplasm of brain, unspecified: Secondary | ICD-10-CM | POA: Insufficient documentation

## 2022-02-12 DIAGNOSIS — Y92410 Unspecified street and highway as the place of occurrence of the external cause: Secondary | ICD-10-CM | POA: Insufficient documentation

## 2022-02-12 MED ORDER — HYDROCODONE-ACETAMINOPHEN 5-325 MG PO TABS
1.0000 | ORAL_TABLET | Freq: Once | ORAL | Status: AC
  Administered 2022-02-12: 1 via ORAL
  Filled 2022-02-12: qty 1
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
## 2022-02-12 NOTE — Discharge Instructions (Signed)
It was a pleasure taking care of you today!  Your CT scans of your neck, back did not show any emergent concerning findings at this time.  Your chest x-ray did not show any emergent concerning findings.  Your CT of your head showed the mass to your brain however it is slightly improved from your previous CT.  You may continue taking your prescription medications as needed.  Call your oncologist and primary care provider to set up a follow-up appointment regarding today's ED visit.  Return to the emergency department if you experience increasing/worsening symptoms.

## 2022-02-13 ENCOUNTER — Other Ambulatory Visit: Payer: Self-pay | Admitting: Internal Medicine

## 2022-02-14 ENCOUNTER — Inpatient Hospital Stay (HOSPITAL_BASED_OUTPATIENT_CLINIC_OR_DEPARTMENT_OTHER): Payer: Medicaid Other | Admitting: Internal Medicine

## 2022-02-14 ENCOUNTER — Other Ambulatory Visit: Payer: Self-pay

## 2022-02-14 ENCOUNTER — Other Ambulatory Visit: Payer: Self-pay | Admitting: *Deleted

## 2022-02-14 ENCOUNTER — Inpatient Hospital Stay: Payer: Medicaid Other

## 2022-02-14 VITALS — BP 161/90 | HR 95 | Temp 98.1°F | Resp 18 | Wt 162.1 lb

## 2022-02-14 DIAGNOSIS — C719 Malignant neoplasm of brain, unspecified: Secondary | ICD-10-CM | POA: Diagnosis not present

## 2022-02-14 DIAGNOSIS — Z5111 Encounter for antineoplastic chemotherapy: Secondary | ICD-10-CM | POA: Diagnosis not present

## 2022-02-14 LAB — CBC WITH DIFFERENTIAL (CANCER CENTER ONLY)
Abs Immature Granulocytes: 0 10*3/uL (ref 0.00–0.07)
Basophils Absolute: 0 10*3/uL (ref 0.0–0.1)
Basophils Relative: 0 %
Eosinophils Absolute: 0.1 10*3/uL (ref 0.0–0.5)
Eosinophils Relative: 1 %
HCT: 39.7 % (ref 36.0–46.0)
Hemoglobin: 13.3 g/dL (ref 12.0–15.0)
Immature Granulocytes: 0 %
Lymphocytes Relative: 20 %
Lymphs Abs: 0.7 10*3/uL (ref 0.7–4.0)
MCH: 31.7 pg (ref 26.0–34.0)
MCHC: 33.5 g/dL (ref 30.0–36.0)
MCV: 94.5 fL (ref 80.0–100.0)
Monocytes Absolute: 0.4 10*3/uL (ref 0.1–1.0)
Monocytes Relative: 10 %
Neutro Abs: 2.4 10*3/uL (ref 1.7–7.7)
Neutrophils Relative %: 69 %
Platelet Count: 217 10*3/uL (ref 150–400)
RBC: 4.2 MIL/uL (ref 3.87–5.11)
RDW: 12.9 % (ref 11.5–15.5)
WBC Count: 3.6 10*3/uL — ABNORMAL LOW (ref 4.0–10.5)
nRBC: 0 % (ref 0.0–0.2)

## 2022-02-14 LAB — CMP (CANCER CENTER ONLY)
ALT: 12 U/L (ref 0–44)
AST: 24 U/L (ref 15–41)
Albumin: 3.7 g/dL (ref 3.5–5.0)
Alkaline Phosphatase: 46 U/L (ref 38–126)
Anion gap: 5 (ref 5–15)
BUN: <5 mg/dL — ABNORMAL LOW (ref 6–20)
CO2: 30 mmol/L (ref 22–32)
Calcium: 9.2 mg/dL (ref 8.9–10.3)
Chloride: 105 mmol/L (ref 98–111)
Creatinine: 0.71 mg/dL (ref 0.44–1.00)
GFR, Estimated: >60 mL/min (ref >60)
Glucose, Bld: 81 mg/dL (ref 70–99)
Potassium: 3.4 mmol/L — ABNORMAL LOW (ref 3.5–5.1)
Sodium: 140 mmol/L (ref 135–145)
Total Bilirubin: 0.3 mg/dL (ref 0.3–1.2)
Total Protein: 6.2 g/dL — ABNORMAL LOW (ref 6.5–8.1)

## 2022-02-14 LAB — TOTAL PROTEIN, URINE DIPSTICK: Protein, ur: <30 mg/dL — AB
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
## 2022-02-15 ENCOUNTER — Ambulatory Visit: Payer: Medicaid Other | Admitting: Physical Therapy

## 2022-02-15 ENCOUNTER — Telehealth: Payer: Self-pay | Admitting: Internal Medicine

## 2022-02-15 NOTE — Telephone Encounter (Signed)
Called patient's mother per 1/18 los notes. Patient scheduled and will be notified.

## 2022-02-17 NOTE — Therapy (Deleted)
OUTPATIENT OCCUPATIONAL THERAPY NEURO EVALUATION  Patient Name: Paula Farmer MRN: [id number] DOB:1980-08-11, 42 y.o., female Today's Date: 02/17/2022  PCP: Marland Kitchen REFERRING PROVIDER: ***  END OF SESSION:   Past Medical History:  Diagnosis Date   Cancer ([place])    Seizures ([place])    No past surgical history on file. Patient Active Problem List   Diagnosis Date Noted   Vasogenic edema ([place])    Headache 07/17/2020   Intractable headache 07/17/2020   GAD (generalized anxiety disorder) 11/20/2018   Anaplastic astrocytoma ([place]) 09/18/2016    ONSET DATE: ***  REFERRING DIAG: ***  THERAPY DIAG:  No diagnosis found.  Rationale for Evaluation and Treatment: {HABREHAB:27488}  SUBJECTIVE:   SUBJECTIVE STATEMENT: *** Pt accompanied by: {accompnied:27141}  PERTINENT HISTORY: ***  PRECAUTIONS: {Therapy precautions:24002}  WEIGHT BEARING RESTRICTIONS: {Yes ***/No:24003}  PAIN:  Are you having pain? {OPRCPAIN:27236}  FALLS: Has patient fallen in last 6 months? {fallsyesno:27318}  LIVING ENVIRONMENT: Lives with: {OPRC lives with:25569::"lives with their family"} Lives in: {Lives in:25570} Stairs: {opstairs:27293} Has following equipment at home: {Assistive devices:23999}  PLOF: {PLOF:24004}  PATIENT GOALS: ***  OBJECTIVE:   HAND DOMINANCE: {MISC; OT HAND DOMINANCE:[id number]}  ADLs: Overall ADLs: *** Transfers/ambulation related to ADLs: Eating: *** Grooming: *** UB Dressing: *** LB Dressing: *** Toileting: *** Bathing: *** Tub Shower transfers: *** Equipment: {equipment:25573}  IADLs: Shopping: *** Light housekeeping: *** Meal Prep: *** Community mobility: *** Medication management: *** Financial management: *** Handwriting: {OTWRITTENEXPRESSION:25361}  MOBILITY STATUS: {OTMOBILITY:25360}  POSTURE COMMENTS:  {posture:25561} Sitting balance: {sitting balance:25483}  ACTIVITY TOLERANCE: Activity tolerance: ***  FUNCTIONAL OUTCOME  MEASURES: {OTFUNCTIONALMEASURES:27238}  UPPER EXTREMITY ROM:    {AROM/PROM:27142} ROM Right eval Left eval  Shoulder flexion    Shoulder abduction    Shoulder adduction    Shoulder extension    Shoulder internal rotation    Shoulder external rotation    Elbow flexion    Elbow extension    Wrist flexion    Wrist extension    Wrist ulnar deviation    Wrist radial deviation    Wrist pronation    Wrist supination    (Blank rows = not tested)  UPPER EXTREMITY MMT:     MMT Right eval Left eval  Shoulder flexion    Shoulder abduction    Shoulder adduction    Shoulder extension    Shoulder internal rotation    Shoulder external rotation    Middle trapezius    Lower trapezius    Elbow flexion    Elbow extension    Wrist flexion    Wrist extension    Wrist ulnar deviation    Wrist radial deviation    Wrist pronation    Wrist supination    (Blank rows = not tested)  HAND FUNCTION: {handfunction:27230}  COORDINATION: {otcoordination:27237}  SENSATION: {sensation:27233}  EDEMA: ***  MUSCLE TONE: {UETONE:25567}  COGNITION: Overall cognitive status: {cognition:24006}  VISION: Subjective report: *** Baseline vision: {OTBASELINEVISION:25363} Visual history: {OTVISUALHISTORY:25364}  VISION ASSESSMENT: {visionassessment:27231}  Patient has difficulty with following activities due to following visual impairments: ***  PERCEPTION: {Perception:25564}  PRAXIS: {Praxis:25565}  OBSERVATIONS: ***   TODAY'S TREATMENT:  DATE: ***   PATIENT EDUCATION: Education details: *** Person educated: {Person educated:25204} Education method: {Education Method:25205} Education comprehension: {Education Comprehension:25206}  HOME EXERCISE PROGRAM: ***   GOALS: Goals reviewed with patient? {yes/no:20286}  SHORT TERM GOALS: Target date:  ***  *** Baseline: Goal status: {GOALSTATUS:25110}  2.  *** Baseline:  Goal status: {GOALSTATUS:25110}  3.  *** Baseline:  Goal status: {GOALSTATUS:25110}  4.  *** Baseline:  Goal status: {GOALSTATUS:25110}  5.  *** Baseline:  Goal status: {GOALSTATUS:25110}  6.  *** Baseline:  Goal status: {GOALSTATUS:25110}  LONG TERM GOALS: Target date: ***  *** Baseline:  Goal status: {GOALSTATUS:25110}  2.  *** Baseline:  Goal status: {GOALSTATUS:25110}  3.  *** Baseline:  Goal status: {GOALSTATUS:25110}  4.  *** Baseline:  Goal status: {GOALSTATUS:25110}  5.  *** Baseline:  Goal status: {GOALSTATUS:25110}  6.  *** Baseline:  Goal status: {GOALSTATUS:25110}  ASSESSMENT:  CLINICAL IMPRESSION: Patient is a *** y.o. *** who was seen today for occupational therapy evaluation for ***.   PERFORMANCE DEFICITS: in functional skills including {OT physical skills:25468}, cognitive skills including {OT cognitive skills:25469}, and psychosocial skills including {OT psychosocial skills:25470}.   IMPAIRMENTS: are limiting patient from {OT performance deficits:25471}.   CO-MORBIDITIES: {Comorbidities:25485} that affects occupational performance. Patient will benefit from skilled OT to address above impairments and improve overall function.  MODIFICATION OR ASSISTANCE TO COMPLETE EVALUATION: {OT modification:25474}  OT OCCUPATIONAL PROFILE AND HISTORY: {OT PROFILE AND HISTORY:25484}  CLINICAL DECISION MAKING: {OT CDM:25475}  REHAB POTENTIAL: {rehabpotential:25112}  EVALUATION COMPLEXITY: {Evaluation complexity:25115}    PLAN:  OT FREQUENCY: {rehab frequency:25116}  OT DURATION: {rehab duration:25117}  PLANNED INTERVENTIONS: {OT Interventions:25467}  RECOMMENDED OTHER SERVICES: ***  CONSULTED AND AGREED WITH PLAN OF CARE: [id number]  PLAN FOR NEXT SESSION: ***   Vianne Bulls, OTR/L 02/17/2022, 11:00 PM

## 2022-02-18 ENCOUNTER — Telehealth: Payer: Self-pay | Admitting: Physical Therapy

## 2022-02-18 ENCOUNTER — Ambulatory Visit: Payer: Medicaid Other | Admitting: Physical Therapy

## 2022-02-18 ENCOUNTER — Ambulatory Visit: Payer: Medicaid Other | Admitting: Occupational Therapy

## 2022-02-18 NOTE — Telephone Encounter (Signed)
Attempted to call patient x2 but neither home or mobile number were working. This is patient's 3rd no show to physical therapy. Patient also missed OT Eval this morning. Will remind of 3 no show/cancellation policy as able. Patient can schedule individual sessions in the next 30 days otherwise will need a new referral.   Malachi Carl, PT, DPT

## 2022-02-21 ENCOUNTER — Other Ambulatory Visit: Payer: Self-pay

## 2022-02-21 ENCOUNTER — Inpatient Hospital Stay (HOSPITAL_BASED_OUTPATIENT_CLINIC_OR_DEPARTMENT_OTHER): Payer: Medicaid Other | Admitting: Internal Medicine

## 2022-02-21 ENCOUNTER — Other Ambulatory Visit: Payer: Self-pay | Admitting: *Deleted

## 2022-02-21 ENCOUNTER — Inpatient Hospital Stay: Payer: Medicaid Other

## 2022-02-21 VITALS — BP 139/98 | HR 96 | Temp 97.7°F | Resp 20 | Wt 158.9 lb

## 2022-02-21 DIAGNOSIS — R569 Unspecified convulsions: Secondary | ICD-10-CM | POA: Diagnosis not present

## 2022-02-21 DIAGNOSIS — C719 Malignant neoplasm of brain, unspecified: Secondary | ICD-10-CM

## 2022-02-21 DIAGNOSIS — Z5111 Encounter for antineoplastic chemotherapy: Secondary | ICD-10-CM | POA: Diagnosis not present

## 2022-02-21 LAB — CMP (CANCER CENTER ONLY)
ALT: 12 U/L (ref 0–44)
AST: 20 U/L (ref 15–41)
Albumin: 3.8 g/dL (ref 3.5–5.0)
Alkaline Phosphatase: 46 U/L (ref 38–126)
Anion gap: 4 — ABNORMAL LOW (ref 5–15)
BUN: <5 mg/dL — ABNORMAL LOW (ref 6–20)
CO2: 33 mmol/L — ABNORMAL HIGH (ref 22–32)
Calcium: 9.1 mg/dL (ref 8.9–10.3)
Chloride: 103 mmol/L (ref 98–111)
Creatinine: 0.85 mg/dL (ref 0.44–1.00)
GFR, Estimated: >60 mL/min (ref >60)
Glucose, Bld: 81 mg/dL (ref 70–99)
Potassium: 3.5 mmol/L (ref 3.5–5.1)
Sodium: 140 mmol/L (ref 135–145)
Total Bilirubin: 0.4 mg/dL (ref 0.3–1.2)
Total Protein: 6.6 g/dL (ref 6.5–8.1)

## 2022-02-21 LAB — CBC WITH DIFFERENTIAL (CANCER CENTER ONLY)
Abs Immature Granulocytes: 0.01 10*3/uL (ref 0.00–0.07)
Basophils Absolute: 0 10*3/uL (ref 0.0–0.1)
Basophils Relative: 0 %
Eosinophils Absolute: 0.1 10*3/uL (ref 0.0–0.5)
Eosinophils Relative: 1 %
HCT: 38.6 % (ref 36.0–46.0)
Hemoglobin: 12.9 g/dL (ref 12.0–15.0)
Immature Granulocytes: 0 %
Lymphocytes Relative: 23 %
Lymphs Abs: 1.1 10*3/uL (ref 0.7–4.0)
MCH: 31.1 pg (ref 26.0–34.0)
MCHC: 33.4 g/dL (ref 30.0–36.0)
MCV: 93 fL (ref 80.0–100.0)
Monocytes Absolute: 0.6 10*3/uL (ref 0.1–1.0)
Monocytes Relative: 11 %
Neutro Abs: 3.1 10*3/uL (ref 1.7–7.7)
Neutrophils Relative %: 65 %
Platelet Count: 225 10*3/uL (ref 150–400)
RBC: 4.15 MIL/uL (ref 3.87–5.11)
RDW: 12.9 % (ref 11.5–15.5)
WBC Count: 4.9 10*3/uL (ref 4.0–10.5)
nRBC: 0 % (ref 0.0–0.2)

## 2022-02-21 LAB — TOTAL PROTEIN, URINE DIPSTICK: Protein, ur: NEGATIVE mg/dL

## 2022-02-21 MED ORDER — SODIUM CHLORIDE 0.9 % IV SOLN: Freq: Once | INTRAVENOUS | Status: AC

## 2022-02-21 MED ORDER — SODIUM CHLORIDE 0.9 % IV SOLN
10.0000 mg/kg | Freq: Once | INTRAVENOUS | Status: AC
  Administered 2022-02-21: 700 mg via INTRAVENOUS
  Filled 2022-02-21: qty 16

## 2022-02-21 NOTE — Patient Instructions (Signed)
[facility]  Discharge Instructions: Thank you for choosing [facility] to provide your oncology and hematology care.   If you have a lab appointment with the Cancer Center, please go directly to the Cancer Center and check in at the registration area.   Wear comfortable clothing and clothing appropriate for easy access to any Portacath or PICC line.   We strive to give you quality time with your provider. You may need to reschedule your appointment if you arrive late (15 or more minutes).  Arriving late affects you and other patients whose appointments are after yours.  Also, if you miss three or more appointments without notifying the office, you may be dismissed from the clinic at the provider's discretion.      For prescription refill requests, have your pharmacy contact our office and allow 72 hours for refills to be completed.    Today you received the following chemotherapy and/or immunotherapy agents: Bevacizumab      To help prevent nausea and vomiting after your treatment, we encourage you to take your nausea medication as directed.  BELOW ARE SYMPTOMS THAT SHOULD BE REPORTED IMMEDIATELY: *FEVER GREATER THAN 100.4 F (38 C) OR HIGHER *CHILLS OR SWEATING *NAUSEA AND VOMITING THAT IS NOT CONTROLLED WITH YOUR NAUSEA MEDICATION *UNUSUAL SHORTNESS OF BREATH *UNUSUAL BRUISING OR BLEEDING *URINARY PROBLEMS (pain or burning when urinating, or frequent urination) *BOWEL PROBLEMS (unusual diarrhea, constipation, pain near the anus) TENDERNESS IN MOUTH AND THROAT WITH OR WITHOUT PRESENCE OF ULCERS (sore throat, sores in mouth, or a toothache) UNUSUAL RASH, SWELLING OR PAIN  UNUSUAL VAGINAL DISCHARGE OR ITCHING   Items with * indicate a potential emergency and should be followed up as soon as possible or go to the Emergency Department if any problems should occur.  Please show the CHEMOTHERAPY ALERT CARD or IMMUNOTHERAPY ALERT CARD at  check-in to the Emergency Department and triage nurse.  Should you have questions after your visit or need to cancel or reschedule your appointment, please contact [facility]  Dept: [phone]  and follow the prompts.  Office hours are 8:00 a.m. to 4:30 p.m. Monday - Friday. Please note that voicemails left after 4:00 p.m. may not be returned until the following business day.  We are closed weekends and major holidays. You have access to a nurse at all times for urgent questions. Please call the main number to the clinic Dept: [phone] and follow the prompts.   For any non-urgent questions, you may also contact your provider using MyChart. We now offer e-Visits for anyone 18 and older to request care online for non-urgent symptoms. For details visit [email or web address].   Also download the MyChart app! Go to the app store, search "MyChart", open the app, select [facility], and log in with your MyChart username and password.   
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
## 2022-02-25 ENCOUNTER — Ambulatory Visit
Admission: RE | Admit: 2022-02-25 | Discharge: 2022-02-25 | Disposition: A | Discharge status: Home / Self Care | Payer: Self-pay | Source: Ambulatory Visit | Attending: Internal Medicine | Admitting: Internal Medicine

## 2022-02-25 ENCOUNTER — Telehealth: Payer: Self-pay | Admitting: *Deleted

## 2022-02-25 ENCOUNTER — Other Ambulatory Visit: Payer: Self-pay | Admitting: *Deleted

## 2022-02-25 DIAGNOSIS — C719 Malignant neoplasm of brain, unspecified: Secondary | ICD-10-CM

## 2022-02-25 NOTE — Progress Notes (Signed)
Request imaging from [facility]

## 2022-02-25 NOTE — Therapy (Incomplete)
OUTPATIENT OCCUPATIONAL THERAPY NEURO EVALUATION  Patient Name: Paula Farmer MRN: [id number] DOB:April 26, 1980, 42 y.o., female Today's Date: 02/25/2022  PCP: none REFERRING PROVIDER: Ventura Sellers, MD  END OF SESSION:   Past Medical History:  Diagnosis Date   Cancer ([place])    Seizures ([place])    No past surgical history on file. Patient Active Problem List   Diagnosis Date Noted   Focal seizures ([place]) 02/21/2022   Vasogenic edema ([facility])    Headache 07/17/2020   Intractable headache 07/17/2020   GAD (generalized anxiety disorder) 11/20/2018   Anaplastic astrocytoma ([place]) 09/18/2016    ONSET DATE: ***  REFERRING DIAG: ***  THERAPY DIAG:  No diagnosis found.  Rationale for Evaluation and Treatment: Rehabilitation  SUBJECTIVE:   SUBJECTIVE STATEMENT: *** Pt accompanied by: {accompnied:27141}  PERTINENT HISTORY: *** left hemiparesis, gait impairment from brain tumor.  Pt with hx of radiation therapy and chemotherapy and biopsy.    PMH:  hx of seizures, anxiety  PRECAUTIONS: {Therapy precautions:24002}  WEIGHT BEARING RESTRICTIONS: {Yes ***/No:24003}  PAIN:  Are you having pain? {OPRCPAIN:27236}  FALLS: Has patient fallen in last 6 months? {fallsyesno:27318}  LIVING ENVIRONMENT: Lives with: {OPRC lives with:25569::"lives with their family"} Lives in: {Lives in:25570} Stairs: {opstairs:27293} Has following equipment at home: {Assistive devices:23999}  PLOF: {PLOF:24004}  PATIENT GOALS: ***  OBJECTIVE:   HAND DOMINANCE: {MISC; OT HAND [phone]}  ADLs: Overall ADLs: *** Transfers/ambulation related to ADLs: Eating: *** Grooming: *** UB Dressing: *** LB Dressing: *** Toileting: *** Bathing: *** Tub Shower transfers: *** Equipment: {equipment:25573}  IADLs: Shopping: *** Light housekeeping: *** Meal Prep: *** Community mobility: *** Medication management: *** Financial management: *** Handwriting:  {OTWRITTENEXPRESSION:25361}  MOBILITY STATUS: {OTMOBILITY:25360}  POSTURE COMMENTS:  {posture:25561} Sitting balance: {sitting balance:25483}  ACTIVITY TOLERANCE: Activity tolerance: ***  FUNCTIONAL OUTCOME MEASURES: {OTFUNCTIONALMEASURES:27238}  UPPER EXTREMITY ROM:    {AROM/PROM:27142} ROM Right eval Left eval  Shoulder flexion    Shoulder abduction    Shoulder adduction    Shoulder extension    Shoulder internal rotation    Shoulder external rotation    Elbow flexion    Elbow extension    Wrist flexion    Wrist extension    Wrist ulnar deviation    Wrist radial deviation    Wrist pronation    Wrist supination    (Blank rows = not tested)  UPPER EXTREMITY MMT:     MMT Right eval Left eval  Shoulder flexion    Shoulder abduction    Shoulder adduction    Shoulder extension    Shoulder internal rotation    Shoulder external rotation    Middle trapezius    Lower trapezius    Elbow flexion    Elbow extension    Wrist flexion    Wrist extension    Wrist ulnar deviation    Wrist radial deviation    Wrist pronation    Wrist supination    (Blank rows = not tested)  HAND FUNCTION: {handfunction:27230}  COORDINATION: {otcoordination:27237}  SENSATION: {sensation:27233}  EDEMA: ***  MUSCLE TONE: {UETONE:25567}  COGNITION: Overall cognitive status: {cognition:24006}  VISION: Subjective report: *** Baseline vision: {OTBASELINEVISION:25363} Visual history: {OTVISUALHISTORY:25364}  VISION ASSESSMENT: {visionassessment:27231}  Patient has difficulty with following activities due to following visual impairments: ***  PERCEPTION: {Perception:25564}  PRAXIS: {Praxis:25565}  OBSERVATIONS: ***   TODAY'S TREATMENT:  DATE: ***   PATIENT EDUCATION: Education details: *** Person educated: {Person educated:25204} Education  method: {Education Method:25205} Education comprehension: {Education Comprehension:25206}  HOME EXERCISE PROGRAM: ***   GOALS: Goals reviewed with patient? {yes/no:20286}  SHORT TERM GOALS: Target date: ***  *** Baseline: Goal status: {GOALSTATUS:25110}  2.  *** Baseline:  Goal status: {GOALSTATUS:25110}  3.  *** Baseline:  Goal status: {GOALSTATUS:25110}  4.  *** Baseline:  Goal status: {GOALSTATUS:25110}  5.  *** Baseline:  Goal status: {GOALSTATUS:25110}  6.  *** Baseline:  Goal status: {GOALSTATUS:25110}  LONG TERM GOALS: Target date: ***  *** Baseline:  Goal status: {GOALSTATUS:25110}  2.  *** Baseline:  Goal status: {GOALSTATUS:25110}  3.  *** Baseline:  Goal status: {GOALSTATUS:25110}  4.  *** Baseline:  Goal status: {GOALSTATUS:25110}  5.  *** Baseline:  Goal status: {GOALSTATUS:25110}  6.  *** Baseline:  Goal status: {GOALSTATUS:25110}  ASSESSMENT:  CLINICAL IMPRESSION: Patient is a *** y.o. *** who was seen today for occupational therapy evaluation for ***.   PERFORMANCE DEFICITS: in functional skills including {OT physical skills:25468}, cognitive skills including {OT cognitive skills:25469}, and psychosocial skills including {OT psychosocial skills:25470}.   IMPAIRMENTS: are limiting patient from {OT performance deficits:25471}.   CO-MORBIDITIES: {Comorbidities:25485} that affects occupational performance. Patient will benefit from skilled OT to address above impairments and improve overall function.  MODIFICATION OR ASSISTANCE TO COMPLETE EVALUATION: {OT modification:25474}  OT OCCUPATIONAL PROFILE AND HISTORY: {OT PROFILE AND HISTORY:25484}  CLINICAL DECISION MAKING: {OT CDM:25475}  REHAB POTENTIAL: {rehabpotential:25112}  EVALUATION COMPLEXITY: {Evaluation complexity:25115}    PLAN:  OT FREQUENCY: {rehab frequency:25116}  OT DURATION: {rehab duration:25117}  PLANNED INTERVENTIONS: {OT  Interventions:25467}  RECOMMENDED OTHER SERVICES: ***  CONSULTED AND AGREED WITH PLAN OF CARE: {DHW:86168}  PLAN FOR NEXT SESSION: Vianne Bulls, OTR/L 02/25/2022, 2:23 PM

## 2022-02-25 NOTE — Telephone Encounter (Signed)
Requested last MRI from [facility] to be pushed to [employer].

## 2022-02-26 ENCOUNTER — Ambulatory Visit: Payer: Medicaid Other | Admitting: Occupational Therapy

## 2022-02-26 ENCOUNTER — Ambulatory Visit: Payer: Medicaid Other | Admitting: Physical Therapy

## 2022-02-26 ENCOUNTER — Encounter: Payer: Self-pay | Admitting: Physical Therapy

## 2022-02-26 NOTE — Therapy (Unsigned)
[facility] [address] [place], [place], [address] Phone: [phone]   Fax:  [phone]  Patient Details  Name: Paula Farmer MRN: [id number] Date of Birth: 1980/12/18 Referring Provider: Ventura Sellers, MD      Encounter Date: 02/26/2022  PHYSICAL THERAPY DISCHARGE SUMMARY  Visits from Start of Care: 1  Current functional level related to goals / functional outcomes: Unable to assess; patient's 4th no show to physical therapy; attended no appointments after eval   Remaining deficits: Unable to assess as patient only attended initial eval; see initial eval for full details   Education / Equipment: Unable to provide education beyond eval; reminded frequently of 3-cancelation/no show visits. Patient will need a new referral for additional PT visits.  Patient agrees to discharge. Patient goals were not met. Patient is being discharged due to not returning since the last visit.   Esperanza Heir, PT, DPT 02/26/2022, 12:12 PM  [facility] [address] [place], [place], [address] Phone: [phone]   Fax:  [phone]

## 2022-03-04 ENCOUNTER — Ambulatory Visit: Payer: Medicaid Other | Admitting: Occupational Therapy

## 2022-03-04 ENCOUNTER — Ambulatory Visit: Payer: Medicaid Other | Admitting: Physical Therapy

## 2022-03-07 ENCOUNTER — Inpatient Hospital Stay: Payer: Medicaid Other | Attending: Internal Medicine | Admitting: Internal Medicine

## 2022-03-07 ENCOUNTER — Inpatient Hospital Stay: Payer: Medicaid Other

## 2022-03-07 DIAGNOSIS — Z5111 Encounter for antineoplastic chemotherapy: Secondary | ICD-10-CM | POA: Insufficient documentation

## 2022-03-07 DIAGNOSIS — C711 Malignant neoplasm of frontal lobe: Secondary | ICD-10-CM | POA: Insufficient documentation

## 2022-03-07 DIAGNOSIS — F17210 Nicotine dependence, cigarettes, uncomplicated: Secondary | ICD-10-CM | POA: Insufficient documentation

## 2022-03-08 ENCOUNTER — Telehealth: Payer: Self-pay | Admitting: Internal Medicine

## 2022-03-08 NOTE — Telephone Encounter (Signed)
Called patient's mother regarding patient's 02/13 appointment. Patient will be notified.

## 2022-03-12 ENCOUNTER — Ambulatory Visit: Payer: Medicaid Other | Admitting: Physical Therapy

## 2022-03-12 ENCOUNTER — Inpatient Hospital Stay: Payer: Medicaid Other

## 2022-03-12 ENCOUNTER — Other Ambulatory Visit: Payer: Self-pay

## 2022-03-12 ENCOUNTER — Inpatient Hospital Stay (HOSPITAL_BASED_OUTPATIENT_CLINIC_OR_DEPARTMENT_OTHER): Payer: Medicaid Other | Admitting: Internal Medicine

## 2022-03-12 ENCOUNTER — Encounter: Payer: Medicaid Other | Admitting: Occupational Therapy

## 2022-03-12 VITALS — BP 146/101 | HR 95 | Temp 97.9°F | Resp 20 | Wt 156.8 lb

## 2022-03-12 VITALS — BP 133/93

## 2022-03-12 DIAGNOSIS — C711 Malignant neoplasm of frontal lobe: Secondary | ICD-10-CM | POA: Diagnosis present

## 2022-03-12 DIAGNOSIS — R569 Unspecified convulsions: Secondary | ICD-10-CM | POA: Diagnosis not present

## 2022-03-12 DIAGNOSIS — C719 Malignant neoplasm of brain, unspecified: Secondary | ICD-10-CM | POA: Diagnosis not present

## 2022-03-12 DIAGNOSIS — Z5111 Encounter for antineoplastic chemotherapy: Secondary | ICD-10-CM | POA: Diagnosis present

## 2022-03-12 DIAGNOSIS — F17210 Nicotine dependence, cigarettes, uncomplicated: Secondary | ICD-10-CM | POA: Diagnosis not present

## 2022-03-12 LAB — CBC WITH DIFFERENTIAL (CANCER CENTER ONLY)
Abs Immature Granulocytes: 0 10*3/uL (ref 0.00–0.07)
Basophils Absolute: 0 10*3/uL (ref 0.0–0.1)
Basophils Relative: 0 %
Eosinophils Absolute: 0.1 10*3/uL (ref 0.0–0.5)
Eosinophils Relative: 2 %
HCT: 39.6 % (ref 36.0–46.0)
Hemoglobin: 13.4 g/dL (ref 12.0–15.0)
Immature Granulocytes: 0 %
Lymphocytes Relative: 26 %
Lymphs Abs: 0.8 10*3/uL (ref 0.7–4.0)
MCH: 31 pg (ref 26.0–34.0)
MCHC: 33.8 g/dL (ref 30.0–36.0)
MCV: 91.7 fL (ref 80.0–100.0)
Monocytes Absolute: 0.4 10*3/uL (ref 0.1–1.0)
Monocytes Relative: 13 %
Neutro Abs: 1.9 10*3/uL (ref 1.7–7.7)
Neutrophils Relative %: 59 %
Platelet Count: 210 10*3/uL (ref 150–400)
RBC: 4.32 MIL/uL (ref 3.87–5.11)
RDW: 12.6 % (ref 11.5–15.5)
WBC Count: 3.2 10*3/uL — ABNORMAL LOW (ref 4.0–10.5)
nRBC: 0 % (ref 0.0–0.2)

## 2022-03-12 LAB — TOTAL PROTEIN, URINE DIPSTICK: Protein, ur: NEGATIVE mg/dL

## 2022-03-12 MED ORDER — SODIUM CHLORIDE 0.9 % IV SOLN
10.0000 mg/kg | Freq: Once | INTRAVENOUS | Status: AC
  Administered 2022-03-12: 700 mg via INTRAVENOUS
  Filled 2022-03-12: qty 16

## 2022-03-12 MED ORDER — SODIUM CHLORIDE 0.9 % IV SOLN: Freq: Once | INTRAVENOUS | Status: AC

## 2022-03-12 NOTE — Patient Instructions (Signed)
[facility]  Discharge Instructions: Thank you for choosing [facility] to provide your oncology and hematology care.   If you have a lab appointment with the [facility], please go directly to the [facility] and check in at the registration area.   Wear comfortable clothing and clothing appropriate for easy access to any Portacath or PICC line.   We strive to give you quality time with your provider. You may need to reschedule your appointment if you arrive late (15 or more minutes).  Arriving late affects you and other patients whose appointments are after yours.  Also, if you miss three or more appointments without notifying the office, you may be dismissed from the clinic at the provider's discretion.      For prescription refill requests, have your pharmacy contact our office and allow 72 hours for refills to be completed.    Today you received the following chemotherapy and/or immunotherapy agents: bevacizumab-adcd      To help prevent nausea and vomiting after your treatment, we encourage you to take your nausea medication as directed.  BELOW ARE SYMPTOMS THAT SHOULD BE REPORTED IMMEDIATELY: *FEVER GREATER THAN 100.4 F (38 C) OR HIGHER *CHILLS OR SWEATING *NAUSEA AND VOMITING THAT IS NOT CONTROLLED WITH YOUR NAUSEA MEDICATION *UNUSUAL SHORTNESS OF BREATH *UNUSUAL BRUISING OR BLEEDING *URINARY PROBLEMS (pain or burning when urinating, or frequent urination) *BOWEL PROBLEMS (unusual diarrhea, constipation, pain near the anus) TENDERNESS IN MOUTH AND THROAT WITH OR WITHOUT PRESENCE OF ULCERS (sore throat, sores in mouth, or a toothache) UNUSUAL RASH, SWELLING OR PAIN  UNUSUAL VAGINAL DISCHARGE OR ITCHING   Items with * indicate a potential emergency and should be followed up as soon as possible or go to the Emergency Department if any problems should occur.  Please show the CHEMOTHERAPY ALERT CARD or IMMUNOTHERAPY ALERT CARD  at check-in to the Emergency Department and triage nurse.  Should you have questions after your visit or need to cancel or reschedule your appointment, please contact [facility]  Dept: [phone]  and follow the prompts.  Office hours are 8:00 a.m. to 4:30 p.m. Monday - Friday. Please note that voicemails left after 4:00 p.m. may not be returned until the following business day.  We are closed weekends and major holidays. You have access to a nurse at all times for urgent questions. Please call the main number to the clinic Dept: [phone] and follow the prompts.   For any non-urgent questions, you may also contact your provider using MyChart. We now offer e-Visits for anyone 82 and older to request care online for non-urgent symptoms. For details visit [email or web address].   Also download the MyChart app! Go to the app store, search "MyChart", open the app, select [facility], and log in with your MyChart username and password.
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
## 2022-03-14 ENCOUNTER — Other Ambulatory Visit: Payer: Self-pay

## 2022-03-14 ENCOUNTER — Telehealth: Payer: Self-pay | Admitting: Dietician

## 2022-03-14 NOTE — Telephone Encounter (Signed)
Patient screened on MST. First attempt to reach. Provided my cell# on voice mail and in text to return call to set up a nutrition consult.  April Manson, RDN, LDN Registered Dietitian, [facility] Part Time Remote (Usual office hours: Tuesday-Thursday) Cell: [phone]

## 2022-03-18 ENCOUNTER — Encounter (HOSPITAL_COMMUNITY): Payer: Self-pay

## 2022-03-18 ENCOUNTER — Ambulatory Visit (HOSPITAL_COMMUNITY)
Admission: RE | Admit: 2022-03-18 | Discharge: 2022-03-18 | Disposition: A | Discharge status: Home / Self Care | Payer: Medicaid Other | Source: Ambulatory Visit | Attending: Internal Medicine | Admitting: Internal Medicine

## 2022-03-18 DIAGNOSIS — C719 Malignant neoplasm of brain, unspecified: Secondary | ICD-10-CM

## 2022-03-19 ENCOUNTER — Other Ambulatory Visit: Payer: Self-pay | Admitting: Internal Medicine

## 2022-03-19 ENCOUNTER — Other Ambulatory Visit: Payer: Self-pay | Admitting: Radiation Therapy

## 2022-03-19 ENCOUNTER — Ambulatory Visit: Payer: Medicaid Other | Admitting: Occupational Therapy

## 2022-03-19 ENCOUNTER — Telehealth: Payer: Self-pay | Admitting: Dietician

## 2022-03-19 DIAGNOSIS — C719 Malignant neoplasm of brain, unspecified: Secondary | ICD-10-CM

## 2022-03-19 NOTE — Telephone Encounter (Signed)
Patient screened on MST. Second attempt to reach. Mobile number is mom's cell. She said to call patient at [phone], tried that # but is was not in service.  Called home phone but her voice mail is not set up. Asked mom to relay message in text.  April Manson, RDN, LDN Registered Dietitian, [place] Part Time Remote (Usual office hours: Tuesday-Thursday) Cell: [phone]

## 2022-03-20 ENCOUNTER — Other Ambulatory Visit: Payer: Self-pay

## 2022-03-21 ENCOUNTER — Other Ambulatory Visit: Payer: Self-pay

## 2022-03-22 ENCOUNTER — Telehealth: Payer: Self-pay

## 2022-03-22 NOTE — Telephone Encounter (Signed)
This nurse received a call from Myrtie Cruise with Dr. Lorette Ang office.  She wanted to verify that patient is still taking Bevacizumab.  This nurse verified that patient is still currently taking Bevacizumab 14 day for 6 cycles.  Advised that patient is scheduled for her fourth cycle on 2/29. She asked if patient is taking an oral medication.  This nurse advised that there is not one listed on her current medication list, however, providers note states that patient will continue Ivosidenib 250 mg twice daily. She states that there office needs to know if this provider will take over prescribing oral medication for the patient.  This nurse advised that her request will be forwarded to the provider.   No further questions or concerns noted at this time.

## 2022-03-25 ENCOUNTER — Inpatient Hospital Stay: Payer: Medicaid Other

## 2022-03-26 ENCOUNTER — Encounter: Payer: Medicaid Other | Admitting: Occupational Therapy

## 2022-03-28 ENCOUNTER — Encounter: Payer: Self-pay | Admitting: Internal Medicine

## 2022-03-28 ENCOUNTER — Other Ambulatory Visit: Payer: Self-pay | Admitting: *Deleted

## 2022-03-28 ENCOUNTER — Other Ambulatory Visit: Payer: Self-pay

## 2022-03-28 ENCOUNTER — Inpatient Hospital Stay (HOSPITAL_BASED_OUTPATIENT_CLINIC_OR_DEPARTMENT_OTHER): Payer: Medicaid Other | Admitting: Internal Medicine

## 2022-03-28 ENCOUNTER — Inpatient Hospital Stay: Payer: Medicaid Other

## 2022-03-28 ENCOUNTER — Telehealth: Payer: Self-pay | Admitting: Pharmacy Technician

## 2022-03-28 ENCOUNTER — Other Ambulatory Visit (HOSPITAL_COMMUNITY): Payer: Self-pay

## 2022-03-28 VITALS — BP 146/78 | HR 79 | Temp 97.9°F | Resp 18 | Wt 155.9 lb

## 2022-03-28 DIAGNOSIS — C719 Malignant neoplasm of brain, unspecified: Secondary | ICD-10-CM

## 2022-03-28 DIAGNOSIS — Z5111 Encounter for antineoplastic chemotherapy: Secondary | ICD-10-CM | POA: Diagnosis not present

## 2022-03-28 DIAGNOSIS — F17210 Nicotine dependence, cigarettes, uncomplicated: Secondary | ICD-10-CM

## 2022-03-28 LAB — CBC WITH DIFFERENTIAL (CANCER CENTER ONLY)
Abs Immature Granulocytes: 0.01 10*3/uL (ref 0.00–0.07)
Basophils Absolute: 0 10*3/uL (ref 0.0–0.1)
Basophils Relative: 0 %
Eosinophils Absolute: 0 10*3/uL (ref 0.0–0.5)
Eosinophils Relative: 1 %
HCT: 38.6 % (ref 36.0–46.0)
Hemoglobin: 12.9 g/dL (ref 12.0–15.0)
Immature Granulocytes: 0 %
Lymphocytes Relative: 27 %
Lymphs Abs: 0.9 10*3/uL (ref 0.7–4.0)
MCH: 31.2 pg (ref 26.0–34.0)
MCHC: 33.4 g/dL (ref 30.0–36.0)
MCV: 93.5 fL (ref 80.0–100.0)
Monocytes Absolute: 0.5 10*3/uL (ref 0.1–1.0)
Monocytes Relative: 13 %
Neutro Abs: 2 10*3/uL (ref 1.7–7.7)
Neutrophils Relative %: 59 %
Platelet Count: 197 10*3/uL (ref 150–400)
RBC: 4.13 MIL/uL (ref 3.87–5.11)
RDW: 12.8 % (ref 11.5–15.5)
WBC Count: 3.4 10*3/uL — ABNORMAL LOW (ref 4.0–10.5)
nRBC: 0 % (ref 0.0–0.2)

## 2022-03-28 LAB — TOTAL PROTEIN, URINE DIPSTICK: Protein, ur: NEGATIVE mg/dL

## 2022-03-28 LAB — CMP (CANCER CENTER ONLY)
ALT: 10 U/L (ref 0–44)
AST: 16 U/L (ref 15–41)
Albumin: 4.2 g/dL (ref 3.5–5.0)
Alkaline Phosphatase: 47 U/L (ref 38–126)
Anion gap: 6 (ref 5–15)
BUN: <5 mg/dL — ABNORMAL LOW (ref 6–20)
CO2: 31 mmol/L (ref 22–32)
Calcium: 8.7 mg/dL — ABNORMAL LOW (ref 8.9–10.3)
Chloride: 103 mmol/L (ref 98–111)
Creatinine: 0.62 mg/dL (ref 0.44–1.00)
GFR, Estimated: >60 mL/min (ref >60)
Glucose, Bld: 86 mg/dL (ref 70–99)
Potassium: 3.1 mmol/L — ABNORMAL LOW (ref 3.5–5.1)
Sodium: 140 mmol/L (ref 135–145)
Total Bilirubin: 0.4 mg/dL (ref 0.3–1.2)
Total Protein: 6.3 g/dL — ABNORMAL LOW (ref 6.5–8.1)

## 2022-03-28 MED ORDER — AMITRIPTYLINE HCL 50 MG PO TABS: ORAL_TABLET | ORAL | 2 refills | Status: AC

## 2022-03-28 MED ORDER — SODIUM CHLORIDE 0.9 % IV SOLN
10.0000 mg/kg | Freq: Once | INTRAVENOUS | Status: AC
  Administered 2022-03-28: 700 mg via INTRAVENOUS
  Filled 2022-03-28: qty 12

## 2022-03-28 MED ORDER — IVOSIDENIB 250 MG PO TABS
500.0000 mg | ORAL_TABLET | Freq: Every day | ORAL | 1 refills | Status: DC
  Filled 2022-03-28 (×2): qty 60, 30d supply, fill #0

## 2022-03-28 MED ORDER — SODIUM CHLORIDE 0.9 % IV SOLN: Freq: Once | INTRAVENOUS | Status: AC

## 2022-03-28 NOTE — Progress Notes (Signed)
[facility] at [facility] [place], [place] [address] [phone]   Interval Evaluation  Date of Service: 03/28/22 Patient Name: Paula Farmer Patient MRN: [id number] Patient DOB: 1980-11-28 Provider: Ventura Sellers, MD  Identifying Statement:  Paula Farmer is a 42 y.o. female with right frontal anaplastic astrocytoma who presents for initial consultation and evaluation.    Referring Provider: [facility] [place],  [place] [id number]  Oncologic History: Anaplastic astrocytoma [facility])  09/18/2016 Initial Diagnosis  Presented with headaches, heaviness in the legs, nausea and gait instability.  CT head: hypoattenuation in the right frontal, temporal and parietal lobes with local mass effect MRI brain: Abnormal large area of T2 hyperintensity of the right temporal and parietal lobes concerning for low-grade primary CNS neoplasm  09/19/2016 Biopsy  Right brain: anaplastic astrocytoma, WHO Grade III, IDH mutant, 1p/19q intact, MGMT methylated  10/21/2016 - 12/04/2016 Combination Chemo / RT Therapy  Radiation (59.4 Gy) and concurrent temozolomide  02/07/2017 - 09/25/2017 Chemotherapy  C1D1 adjuvant temozolomide 150 mg/m2. She only received 3 monthly cycles of TMZ secondary to noncompliance with appointments.  06/21/2020 Surgery  Stereotactic biopsy (Dr. 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   07/17/2020 - 07/17/2020 Chemotherapy  OP ONC Brain - Temozolomide (Days 1-5) Plan Provider: Erin Fulling, MD Treatment goal: Adjuvant Line of treatment: Adjuvant  09/05/2020 Genetic Test  The patient had genetic testing of her tumor through [facility] which reported on this day. Her tumor was found to be MGMT promoter methylated and IDH 1 mutated but did not have other targetable mutations.  10/12/2020 - 11/24/2020 Chemotherapy  She  began cycle 1 of lomustine on 11/06/2020. She received a total dose of 130 mg which was approximately 80 mg per metered squared. She was to take one 100 mg capsule and three 10 mg capsules. She did eventually complete 2 cycles of the lomustine.  OP ONC brain lomustine Plan Provider: Erin Fulling, MD Treatment goal: Adjuvant Line of treatment: Adjuvant  03/15/2021 Progression  Imaging evidence of progression along with worsening symptoms of headaches and seizures.  04/16/2021 - Chemotherapy  C1-3/20; C2-4/3; C3 4/17; C4 5/1; C5 5/15; C6 06/26/21; C7 07/12/21; C8 08/02/21 OP ONC Brain - Bevacizumab Plan Provider: Erin Fulling, MD Treatment goal: Control Line of treatment: [No plan line of treatment]  05/02/2021 - 06/06/2021 Radiation Therapy  Repeat simulation for radiation for recurrent disease. Tolerated well with increased headaches requiring steroids in latter part of treatment.   [id number] Complications  Patient was hospitalized on the inpatient oncology service after being admitted for progressive headaches and left hemiparesis. She responded somewhat to increases in her steroids after imaging confirmed progressive disease. After discussion with family, she wished to try the IDH inhibitor ivosidenib and we were able to obtain this for her which she started at the time of discharge on 12/26/2021.  12/26/2021 - Chemotherapy  Patient began oral ivosidenib therapy 250 mg tablets, 2 daily. We also elected to restart her bevacizumab initially at a dose of 5 mg/kg which will be dose escalated to 10 mg/kg with subsequent treatments.   Biomarkers:  MGMT Methylated.  IDH 1/2 Mutated.  EGFR Unknown  1p/19q Wild type   Interval History: Paula Farmer presents today for avastin infusion.  No new or progressive changes.  Body pains are still present, unchanged. Sleep has been more consistent.  No other new or progressive  changes.  Paula (01/22/22) presents today for evaluation, plan for  avastin therapy at request of her primary oncologist, Dr. Maylon Peppers.  She continues to complain of diffuse pain, primarily in "leg muscles", but also in right knee.  More recently has complained of headaches which now occur every night.  She is minimally active at home, does walk with a cane.  No recent PT.  Sleep is very poor and interrupted.  Decadron currently at '12mg'$  daily.   Medications: Current Outpatient Medications on File Prior to Visit  Medication Sig Dispense Refill   clindamycin (CLEOCIN) 150 MG capsule Take 2 capsules (300 mg total) by mouth 3 (three) times daily. 63 capsule 0   [facility] SENNA PLUS 8.6-50 MG tablet Take 2 tablets by mouth daily as needed for moderate constipation.     erythromycin ophthalmic ointment Place 1 application. into both eyes at bedtime.     HYDROcodone-acetaminophen (NORCO) 5-325 MG tablet Take 1-2 tablets by mouth every 4 (four) hours as needed. 20 tablet 0   ibuprofen (ADVIL) 600 MG tablet Take 600 mg by mouth every 6 (six) hours as needed for moderate pain.     levETIRAcetam (KEPPRA) 1000 MG tablet Take 1,000 mg by mouth 2 (two) times daily.     LORazepam (ATIVAN) 1 MG tablet Take 1 tablet (1 mg total) by mouth 3 (three) times daily as needed for anxiety. (Patient taking differently: Take 0.5-1 mg by mouth 3 (three) times daily as needed for anxiety.) 15 tablet 0   methocarbamol (ROBAXIN) 500 MG tablet Take 500 mg by mouth 3 (three) times daily as needed for muscle spasms.     Multiple Vitamins-Minerals (HM MULTIVITAMIN ADULT GUMMY PO) Take 2 tablets by mouth daily.     omeprazole (PRILOSEC) 20 MG capsule Take 20 mg by mouth daily.     ondansetron (ZOFRAN) 8 MG tablet Take 8 mg by mouth every 8 (eight) hours as needed for nausea or vomiting.     Oxycodone HCl 10 MG TABS Take 1 tablet by mouth every 4 (four) hours as needed.     No current facility-administered medications on file prior to visit.    Allergies: No Known Allergies Past Medical History:  Past  Medical History:  Diagnosis Date   Cancer ([place])    Seizures ([place])    Past Surgical History: No past surgical history on file. Social History:  Social History   Socioeconomic History   Marital status: Single    Spouse name: Not on file   Number of children: Not on file   Years of education: Not on file   Highest education level: Not on file  Occupational History   Not on file  Tobacco Use   Smoking status: Some Days    Packs/day: 0.25    Years: 10.00    Total pack years: 2.50    Types: Cigarettes   Smokeless tobacco: Never  Vaping Use   Vaping Use: Never used  Substance and Sexual Activity   Alcohol use: Not Currently    Alcohol/week: 2.0 standard drinks of alcohol    Types: 2 Glasses of wine per week    Comment: drinks wine twice a month   Drug use: Not Currently   Sexual activity: Not Currently  Other Topics Concern   Not on file  Social History Narrative   Not on file   Social Determinants of Health   Financial Resource Strain: Not on file  Food Insecurity: Not on file  Transportation Needs: Not on file  Physical Activity: Not on file  Stress: Not on file  Social Connections: Not on file  Intimate Partner Violence: Not on file   Family History:  Family History  Problem Relation Age of Onset   Cancer Mother     Review of Systems: Constitutional: Doesn't report fevers, chills or abnormal weight loss Eyes: Doesn't report blurriness of vision Ears, nose, mouth, throat, and face: Doesn't report sore throat Respiratory: Doesn't report cough, dyspnea or wheezes Cardiovascular: Doesn't report palpitation, chest discomfort  Gastrointestinal:  Doesn't report nausea, constipation, diarrhea GU: Doesn't report incontinence Skin: Doesn't report skin rashes Neurological: Per HPI Musculoskeletal: Doesn't report joint pain Behavioral/Psych: Doesn't report anxiety  Physical Exam: Vitals:   03/28/22 1232  BP: (!) 146/78  Pulse: 79  Resp: 18  Temp: 97.9 F (36.6  C)  SpO2: 100%   KPS: 60. General: Alert, cooperative, pleasant, in no acute distress Head: Normal EENT: No conjunctival injection or scleral icterus.  Lungs: Resp effort normal Cardiac: Regular rate Abdomen: Non-distended abdomen Skin: No rashes cyanosis or petechiae. Extremities: No clubbing or edema  Neurologic Exam: Mental Status: Awake, alert, attentive to examiner. Oriented to self and environment. Language is fluent with intact comprehension.  Cranial Nerves: Visual acuity is grossly normal. Left hemianopia. Extra-ocular movements intact. No ptosis. Face is symmetric Motor: Tone and bulk are normal. Power is 4/5 in left arm and leg. Reflexes are symmetric, no pathologic reflexes present.  Sensory: Intact to light touch Gait: hemiparetic   Labs: I have reviewed the data as listed    Component Value Date/Time   NA 140 03/28/2022 1205   K 3.1 (L) 03/28/2022 1205   CL 103 03/28/2022 1205   CO2 31 03/28/2022 1205   GLUCOSE 86 03/28/2022 1205   BUN PENDING 03/28/2022 1205   CREATININE 0.62 03/28/2022 1205   CALCIUM 8.7 (L) 03/28/2022 1205   PROT 6.3 (L) 03/28/2022 1205   ALBUMIN 4.2 03/28/2022 1205   AST 16 03/28/2022 1205   ALT 10 03/28/2022 1205   ALKPHOS 47 03/28/2022 1205   BILITOT 0.4 03/28/2022 1205   GFRNONAA >60 03/28/2022 1205   Lab Results  Component Value Date   WBC 3.4 (L) 03/28/2022   NEUTROABS 2.0 03/28/2022   HGB 12.9 03/28/2022   HCT 38.6 03/28/2022   MCV 93.5 03/28/2022   PLT 197 03/28/2022    Imaging: [facility] Clinician Interpretation: I have personally reviewed the CNS images as listed.  My interpretation, in the context of the patient's clinical presentation, is progressive disease  No results found.   Assessment/Plan Anaplastic astrocytoma ([place]) - Plan: Infusion Appointment Request, Clinic Appointment Request, Lab Appointment Request, CBC with Differential ([facility] Only)  Helena Clingman is clinically stable today, labs are  within normal limits.    She is cleared to proceed with cycle #5 avastin '10mg'$ /kg IV q2 weeks.   Avastin should be held for the following:  ANC less than 500  Platelets less than 50,000  LFT or creatinine greater than 2x ULN  If clinical concerns/contraindications develop  In addition she will con't on daily oral Tibsovo '250mg'$  BID.  We are happy to refill Tibsovo going forward if it remains part of her treatment plan.  All questions were answered. The patient knows to call the clinic with any problems, questions or concerns. No barriers to learning were detected.  We ask that Hargun Fiechtner return to clinic in 3 weeks for MRI brain review, or sooner as needed.    The total time spent in  the encounter was 30 minutes and more than 50% was on counseling and review of test results   Ventura Sellers, MD Medical Director of Neuro-Oncology [facility] [place] 03/28/22 1:16 PM

## 2022-03-28 NOTE — Telephone Encounter (Signed)
Oral Oncology Patient Advocate Encounter  After completing a benefits investigation, prior authorization for Tibsovo is not required at this time through [facility].  Patient's copay is $3.    Cost override placed at [phone] valid until   Lady Deutscher, [place] Patient [facility] Direct Number: [phone]  Fax: [phone]

## 2022-03-28 NOTE — Patient Instructions (Signed)
[facility]  Discharge Instructions: Thank you for choosing [facility] to provide your oncology and hematology care.   If you have a lab appointment with the [facility], please go directly to the [facility] and check in at the registration area.   Wear comfortable clothing and clothing appropriate for easy access to any Portacath or PICC line.   We strive to give you quality time with your provider. You may need to reschedule your appointment if you arrive late (15 or more minutes).  Arriving late affects you and other patients whose appointments are after yours.  Also, if you miss three or more appointments without notifying the office, you may be dismissed from the clinic at the provider's discretion.      For prescription refill requests, have your pharmacy contact our office and allow 72 hours for refills to be completed.    Today you received the following chemotherapy and/or immunotherapy agents: bevacizumab-adcd      To help prevent nausea and vomiting after your treatment, we encourage you to take your nausea medication as directed.  BELOW ARE SYMPTOMS THAT SHOULD BE REPORTED IMMEDIATELY: *FEVER GREATER THAN 100.4 F (38 C) OR HIGHER *CHILLS OR SWEATING *NAUSEA AND VOMITING THAT IS NOT CONTROLLED WITH YOUR NAUSEA MEDICATION *UNUSUAL SHORTNESS OF BREATH *UNUSUAL BRUISING OR BLEEDING *URINARY PROBLEMS (pain or burning when urinating, or frequent urination) *BOWEL PROBLEMS (unusual diarrhea, constipation, pain near the anus) TENDERNESS IN MOUTH AND THROAT WITH OR WITHOUT PRESENCE OF ULCERS (sore throat, sores in mouth, or a toothache) UNUSUAL RASH, SWELLING OR PAIN  UNUSUAL VAGINAL DISCHARGE OR ITCHING   Items with * indicate a potential emergency and should be followed up as soon as possible or go to the Emergency Department if any problems should occur.  Please show the CHEMOTHERAPY ALERT CARD or IMMUNOTHERAPY ALERT CARD  at check-in to the Emergency Department and triage nurse.  Should you have questions after your visit or need to cancel or reschedule your appointment, please contact [facility]  Dept: [phone]  and follow the prompts.  Office hours are 8:00 a.m. to 4:30 p.m. Monday - Friday. Please note that voicemails left after 4:00 p.m. may not be returned until the following business day.  We are closed weekends and major holidays. You have access to a nurse at all times for urgent questions. Please call the main number to the clinic Dept: [phone] and follow the prompts.   For any non-urgent questions, you may also contact your provider using MyChart. We now offer e-Visits for anyone 41 and older to request care online for non-urgent symptoms. For details visit [email or web address].   Also download the MyChart app! Go to the app store, search "MyChart", open the app, select [facility], and log in with your MyChart username and password.

## 2022-03-29 ENCOUNTER — Other Ambulatory Visit: Payer: Self-pay

## 2022-04-01 ENCOUNTER — Encounter: Payer: Self-pay | Admitting: Internal Medicine

## 2022-04-09 ENCOUNTER — Other Ambulatory Visit: Payer: Self-pay | Admitting: Internal Medicine

## 2022-04-09 ENCOUNTER — Telehealth: Payer: Self-pay | Admitting: Internal Medicine

## 2022-04-09 DIAGNOSIS — C719 Malignant neoplasm of brain, unspecified: Secondary | ICD-10-CM

## 2022-04-09 NOTE — Telephone Encounter (Signed)
Called main number listed for patient. Person that answered claimed they didn't speak english. Patient has seen mychart when next treatment was scheduled.

## 2022-04-15 ENCOUNTER — Encounter (HOSPITAL_COMMUNITY): Payer: Self-pay | Admitting: Vascular Surgery

## 2022-04-15 NOTE — Anesthesia Preprocedure Evaluation (Signed)
Anesthesia Evaluation  Patient identified by MRN, date of birth, ID band Patient awake    Reviewed: Allergy & Precautions, H&P , NPO status , Patient's Chart, lab work & pertinent test results  Airway Mallampati: II  TM Distance: >3 FB Neck ROM: Full    Dental no notable dental hx.    Pulmonary Current Smoker   Pulmonary exam normal breath sounds clear to auscultation       Cardiovascular negative cardio ROS Normal cardiovascular exam Rhythm:Regular Rate:Normal     Neuro/Psych Seizures -,  L sided hemiparesis  Neuromuscular disease  negative psych ROS   GI/Hepatic negative GI ROS, Neg liver ROS,,,  Endo/Other  negative endocrine ROS    Renal/GU negative Renal ROS  negative genitourinary   Musculoskeletal negative musculoskeletal ROS (+)    Abdominal   Peds negative pediatric ROS (+)  Hematology negative hematology ROS (+)   Anesthesia Other Findings   Reproductive/Obstetrics negative OB ROS                             Anesthesia Physical Anesthesia Plan  ASA: 3  Anesthesia Plan: General   Post-op Pain Management: Minimal or no pain anticipated   Induction: Intravenous  PONV Risk Score and Plan: 2 and Ondansetron, Dexamethasone and Treatment may vary due to age or medical condition  Airway Management Planned: Oral ETT  Additional Equipment:   Intra-op Plan:   Post-operative Plan: Extubation in OR  Informed Consent: I have reviewed the patients History and Physical, chart, labs and discussed the procedure including the risks, benefits and alternatives for the proposed anesthesia with the patient or authorized representative who has indicated his/her understanding and acceptance.     Dental advisory given  Plan Discussed with: CRNA and Surgeon  Anesthesia Plan Comments: (PAT note written 04/15/2022 by Myra Gianotti, PA-C.  )       Anesthesia Quick Evaluation

## 2022-04-15 NOTE — Progress Notes (Signed)
Anesthesia Chart Review: Kathleene Hazel  Case: [id number] Date/Time: 04/16/22 0800   Procedure: MRI WITH ANESTHESIA OF BRAIN WITH AND WITHOUT CONTRAST   Anesthesia type: General   Pre-op diagnosis:      BRAIN/CNS NEOPLASUM      ANAPLASTIC ASTROCYTOMA   Location: [facility] OR RADIOLOGY ROOM / [facility] OR   Surgeons: Radiologist, Medication, MD       DISCUSSION: Patient is a 42 year old female scheduled for brain MRI under anesthesia.  H&P per Dr. Mickeal Skinner on 03/28/22. She has a history of anaplastic astrocytoma diagnosed 09/18/2016, s/p chemoradiation. Evidence of progression May 2022 s/p additional chemoradiation. Developed left hemiparesis 12/19/21 and started on daily ivosidenib and bevacizumab Q 2 weeks. Last benvacizumab dose seen in on 03/28/22 with plans to follow-up in 3 months after brain MRI.   Anesthesia team to evaluate on the day of procedure.    VS:  BP Readings from Last 3 Encounters:  03/28/22 (!) 146/78  03/12/22 (!) 133/93  03/12/22 (!) 146/101   Pulse Readings from Last 3 Encounters:  03/28/22 79  03/12/22 95  02/21/22 96     PROVIDERS: [facility], [facility], Alroy Dust, MD is [place]   LABS: Most recent labs results in [facility] include: Lab Results  Component Value Date   WBC 3.4 (L) 03/28/2022   HGB 12.9 03/28/2022   HCT 38.6 03/28/2022   PLT 197 03/28/2022   GLUCOSE 86 03/28/2022   ALT 10 03/28/2022   AST 16 03/28/2022   NA 140 03/28/2022   K 3.1 (L) 03/28/2022   CL 103 03/28/2022   CREATININE 0.62 03/28/2022   BUN <5 (L) 03/28/2022   CO2 31 03/28/2022   INR 0.9 03/14/2021     IMAGES: MRI Brain 12/20/21 ([facility]): Interval increase in size and extent of heterogeneously enhancing infiltrative tumor centered in the right temporal lobe and subinsular region, concerning for tumor progression. There has been marked interval increase in extent of surrounding vasogenic edema contributing to increasing leftward midline shift, right uncal herniation and mass  effect on the right midbrain. There is interval enlargement of the occipital horns and left temporal horn with transependymal flow of CSF suggesting entrapment of the lateral ventricles and developing hydrocephalus.    EKG: EKG 12/25/21 ([facility]): Per Narrative in Care Everywhere: SR with on his arrhythmia.  Normal ECG.  EKG 03/14/21: SR, probable LAE.   CV: N/A  Past Medical History:  Diagnosis Date   Cancer ([place])    Seizures ([place])     No past surgical history on file.  MEDICATIONS: No current facility-administered medications for this encounter.    amitriptyline (ELAVIL) 50 MG tablet   clindamycin (CLEOCIN) 150 MG capsule   [employer]S SENNA PLUS 8.6-50 MG tablet   erythromycin ophthalmic ointment   HYDROcodone-acetaminophen (NORCO) 5-325 MG tablet   ibuprofen (ADVIL) 600 MG tablet   ivosidenib (TIBSOVO) 250 MG tablet   levETIRAcetam (KEPPRA) 1000 MG tablet   LORazepam (ATIVAN) 1 MG tablet   methocarbamol (ROBAXIN) 500 MG tablet   Multiple Vitamins-Minerals (HM MULTIVITAMIN ADULT GUMMY PO)   omeprazole (PRILOSEC) 20 MG capsule   ondansetron (ZOFRAN) 8 MG tablet   Oxycodone HCl 10 MG TABS  Clindamycin is dated for April 2023.    Myra Gianotti, PA-C Surgical Short Stay/Anesthesiology [facility] Phone [phone] [facility] Phone [phone] 04/15/2022 1:49 PM

## 2022-04-16 ENCOUNTER — Ambulatory Visit (HOSPITAL_COMMUNITY): Admission: RE | Admit: 2022-04-16 | Payer: No Typology Code available for payment source | Source: Ambulatory Visit

## 2022-04-16 ENCOUNTER — Ambulatory Visit (HOSPITAL_COMMUNITY): Admission: RE | Admit: 2022-04-16 | Payer: No Typology Code available for payment source | Source: Home / Self Care

## 2022-04-16 SURGERY — MRI WITH ANESTHESIA
Anesthesia: General

## 2022-04-18 ENCOUNTER — Telehealth: Payer: Self-pay | Admitting: Internal Medicine

## 2022-04-18 ENCOUNTER — Other Ambulatory Visit (HOSPITAL_COMMUNITY): Payer: Self-pay

## 2022-04-18 ENCOUNTER — Inpatient Hospital Stay: Payer: Medicaid Other

## 2022-04-18 ENCOUNTER — Inpatient Hospital Stay: Payer: Medicaid Other | Attending: Internal Medicine

## 2022-04-18 ENCOUNTER — Inpatient Hospital Stay: Payer: Medicaid Other | Admitting: Internal Medicine

## 2022-04-18 NOTE — Telephone Encounter (Signed)
Rescheduled 03/21 appointment to 03/28 per patient's request. Patient has updated calendar.

## 2022-04-19 ENCOUNTER — Other Ambulatory Visit (HOSPITAL_COMMUNITY): Payer: Self-pay

## 2022-04-22 ENCOUNTER — Inpatient Hospital Stay: Payer: Medicaid Other

## 2022-04-22 ENCOUNTER — Telehealth: Payer: Self-pay | Admitting: *Deleted

## 2022-04-22 ENCOUNTER — Other Ambulatory Visit (HOSPITAL_COMMUNITY): Payer: Self-pay

## 2022-04-22 ENCOUNTER — Other Ambulatory Visit: Payer: Self-pay | Admitting: Internal Medicine

## 2022-04-22 DIAGNOSIS — C719 Malignant neoplasm of brain, unspecified: Secondary | ICD-10-CM

## 2022-04-22 NOTE — Telephone Encounter (Signed)
Per Dr Mickeal Skinner patient has to have a scan done prior to treatment on Thursday.  He ordered CT head w wo contrast.    Patients phone number is disconnected.  Phoned mother and she will schedule.  She is aware that patient has to have this scan done prior to Thursday's appt.  She verbally stated she would call and get it scheduled.

## 2022-04-24 ENCOUNTER — Other Ambulatory Visit (HOSPITAL_COMMUNITY): Payer: Self-pay

## 2022-04-24 ENCOUNTER — Ambulatory Visit (HOSPITAL_COMMUNITY): Payer: Medicaid Other

## 2022-04-25 ENCOUNTER — Telehealth: Payer: Self-pay | Admitting: *Deleted

## 2022-04-25 ENCOUNTER — Inpatient Hospital Stay: Payer: Medicaid Other

## 2022-04-25 ENCOUNTER — Inpatient Hospital Stay: Payer: Medicaid Other | Admitting: Internal Medicine

## 2022-04-25 NOTE — Telephone Encounter (Signed)
PC to patient's mother Vanita Ingles, patient's phone apparently disconnected - call will not go through.  Informed Vanita Ingles that patient missed her scheduled appointments here this a.m. & that CT has not been scheduled.  Per Dr Mickeal Skinner patient will not receive tx until CT is done.  Central Scheduling number given to Vanita Ingles, she states she can call to schedule CT.
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
## 2022-04-26 ENCOUNTER — Emergency Department (HOSPITAL_COMMUNITY): Payer: Medicaid Other

## 2022-04-26 ENCOUNTER — Inpatient Hospital Stay (HOSPITAL_COMMUNITY)
Admission: EM | Admit: 2022-04-26 | Discharge: 2022-04-30 | DRG: 054 | Disposition: A | Discharge status: Hospice | Payer: Medicaid Other | Attending: Internal Medicine | Admitting: Internal Medicine

## 2022-04-26 ENCOUNTER — Encounter (HOSPITAL_COMMUNITY): Payer: Self-pay | Admitting: Emergency Medicine

## 2022-04-26 ENCOUNTER — Encounter: Payer: Self-pay | Admitting: Internal Medicine

## 2022-04-26 ENCOUNTER — Other Ambulatory Visit: Payer: Self-pay

## 2022-04-26 DIAGNOSIS — G8194 Hemiplegia, unspecified affecting left nondominant side: Secondary | ICD-10-CM | POA: Diagnosis present

## 2022-04-26 DIAGNOSIS — G935 Compression of brain: Secondary | ICD-10-CM | POA: Diagnosis present

## 2022-04-26 DIAGNOSIS — R531 Weakness: Principal | ICD-10-CM

## 2022-04-26 DIAGNOSIS — D72819 Decreased white blood cell count, unspecified: Secondary | ICD-10-CM | POA: Diagnosis present

## 2022-04-26 DIAGNOSIS — Z85841 Personal history of malignant neoplasm of brain: Secondary | ICD-10-CM

## 2022-04-26 DIAGNOSIS — F17210 Nicotine dependence, cigarettes, uncomplicated: Secondary | ICD-10-CM | POA: Diagnosis present

## 2022-04-26 DIAGNOSIS — C719 Malignant neoplasm of brain, unspecified: Secondary | ICD-10-CM

## 2022-04-26 DIAGNOSIS — Z87898 Personal history of other specified conditions: Secondary | ICD-10-CM

## 2022-04-26 DIAGNOSIS — E876 Hypokalemia: Secondary | ICD-10-CM | POA: Diagnosis present

## 2022-04-26 DIAGNOSIS — Z91128 Patient's intentional underdosing of medication regimen for other reason: Secondary | ICD-10-CM

## 2022-04-26 DIAGNOSIS — Z515 Encounter for palliative care: Secondary | ICD-10-CM

## 2022-04-26 DIAGNOSIS — F411 Generalized anxiety disorder: Secondary | ICD-10-CM | POA: Diagnosis present

## 2022-04-26 DIAGNOSIS — G893 Neoplasm related pain (acute) (chronic): Secondary | ICD-10-CM | POA: Diagnosis present

## 2022-04-26 DIAGNOSIS — D496 Neoplasm of unspecified behavior of brain: Secondary | ICD-10-CM | POA: Diagnosis present

## 2022-04-26 DIAGNOSIS — Z7189 Other specified counseling: Secondary | ICD-10-CM

## 2022-04-26 DIAGNOSIS — Z5982 Transportation insecurity: Secondary | ICD-10-CM

## 2022-04-26 DIAGNOSIS — C711 Malignant neoplasm of frontal lobe: Principal | ICD-10-CM | POA: Diagnosis present

## 2022-04-26 DIAGNOSIS — Z79899 Other long term (current) drug therapy: Secondary | ICD-10-CM

## 2022-04-26 DIAGNOSIS — Z9221 Personal history of antineoplastic chemotherapy: Secondary | ICD-10-CM

## 2022-04-26 DIAGNOSIS — Z923 Personal history of irradiation: Secondary | ICD-10-CM

## 2022-04-26 DIAGNOSIS — Z91199 Patient's noncompliance with other medical treatment and regimen due to unspecified reason: Secondary | ICD-10-CM

## 2022-04-26 DIAGNOSIS — Z66 Do not resuscitate: Secondary | ICD-10-CM | POA: Diagnosis not present

## 2022-04-26 LAB — CBC WITH DIFFERENTIAL/PLATELET
Abs Immature Granulocytes: 0 10*3/uL (ref 0.00–0.07)
Basophils Absolute: 0 10*3/uL (ref 0.0–0.1)
Basophils Relative: 0 %
Eosinophils Absolute: 0 10*3/uL (ref 0.0–0.5)
Eosinophils Relative: 1 %
HCT: 42.1 % (ref 36.0–46.0)
Hemoglobin: 13.5 g/dL (ref 12.0–15.0)
Immature Granulocytes: 0 %
Lymphocytes Relative: 25 %
Lymphs Abs: 0.8 10*3/uL (ref 0.7–4.0)
MCH: 30.4 pg (ref 26.0–34.0)
MCHC: 32.1 g/dL (ref 30.0–36.0)
MCV: 94.8 fL (ref 80.0–100.0)
Monocytes Absolute: 0.4 10*3/uL (ref 0.1–1.0)
Monocytes Relative: 12 %
Neutro Abs: 2 10*3/uL (ref 1.7–7.7)
Neutrophils Relative %: 62 %
Platelets: 184 10*3/uL (ref 150–400)
RBC: 4.44 MIL/uL (ref 3.87–5.11)
RDW: 12.6 % (ref 11.5–15.5)
WBC: 3.3 10*3/uL — ABNORMAL LOW (ref 4.0–10.5)
nRBC: 0 % (ref 0.0–0.2)

## 2022-04-26 LAB — COMPREHENSIVE METABOLIC PANEL
ALT: 15 U/L (ref 0–44)
AST: 21 U/L (ref 15–41)
Albumin: 4.3 g/dL (ref 3.5–5.0)
Alkaline Phosphatase: 46 U/L (ref 38–126)
Anion gap: 9 (ref 5–15)
BUN: 6 mg/dL (ref 6–20)
CO2: 27 mmol/L (ref 22–32)
Calcium: 9 mg/dL (ref 8.9–10.3)
Chloride: 103 mmol/L (ref 98–111)
Creatinine, Ser: 0.65 mg/dL (ref 0.44–1.00)
GFR, Estimated: >60 mL/min (ref >60)
Glucose, Bld: 100 mg/dL — ABNORMAL HIGH (ref 70–99)
Potassium: 3 mmol/L — ABNORMAL LOW (ref 3.5–5.1)
Sodium: 139 mmol/L (ref 135–145)
Total Bilirubin: 0.6 mg/dL (ref 0.3–1.2)
Total Protein: 7.3 g/dL (ref 6.5–8.1)

## 2022-04-26 LAB — I-STAT CHEM 8, ED
BUN: 4 mg/dL — ABNORMAL LOW (ref 6–20)
Calcium, Ion: 1.14 mmol/L — ABNORMAL LOW (ref 1.15–1.40)
Chloride: 101 mmol/L (ref 98–111)
Creatinine, Ser: 0.7 mg/dL (ref 0.44–1.00)
Glucose, Bld: 96 mg/dL (ref 70–99)
HCT: 42 % (ref 36.0–46.0)
Hemoglobin: 14.3 g/dL (ref 12.0–15.0)
Potassium: 3.1 mmol/L — ABNORMAL LOW (ref 3.5–5.1)
Sodium: 142 mmol/L (ref 135–145)
TCO2: 28 mmol/L (ref 22–32)

## 2022-04-26 MED ORDER — LEVETIRACETAM 500 MG PO TABS
1000.0000 mg | ORAL_TABLET | Freq: Two times a day (BID) | ORAL | Status: DC
  Administered 2022-04-27 – 2022-04-30 (×7): 1000 mg via ORAL
  Filled 2022-04-26 (×8): qty 2

## 2022-04-26 MED ORDER — ONDANSETRON HCL 4 MG PO TABS: 4.0000 mg | ORAL_TABLET | Freq: Four times a day (QID) | ORAL | Status: DC | PRN

## 2022-04-26 MED ORDER — ACETAMINOPHEN 650 MG RE SUPP: 650.0000 mg | Freq: Four times a day (QID) | RECTAL | Status: DC | PRN

## 2022-04-26 MED ORDER — ACETAMINOPHEN 325 MG PO TABS
650.0000 mg | ORAL_TABLET | Freq: Four times a day (QID) | ORAL | Status: DC | PRN
  Administered 2022-04-27: 650 mg via ORAL
  Filled 2022-04-26 (×2): qty 2

## 2022-04-26 MED ORDER — POTASSIUM CHLORIDE 20 MEQ PO PACK
60.0000 meq | PACK | Freq: Once | ORAL | Status: AC
  Administered 2022-04-26: 60 meq via ORAL
  Filled 2022-04-26: qty 3

## 2022-04-26 MED ORDER — ONDANSETRON HCL 4 MG/2ML IJ SOLN: 4.0000 mg | Freq: Four times a day (QID) | INTRAMUSCULAR | Status: DC | PRN

## 2022-04-26 MED ORDER — LORAZEPAM 2 MG/ML IJ SOLN: 1.0000 mg | Freq: Once | INTRAMUSCULAR | Status: DC | PRN

## 2022-04-26 MED ORDER — METHOCARBAMOL 500 MG PO TABS: 500.0000 mg | ORAL_TABLET | Freq: Three times a day (TID) | ORAL | Status: DC | PRN

## 2022-04-26 MED ORDER — SODIUM CHLORIDE 0.9% FLUSH
3.0000 mL | Freq: Two times a day (BID) | INTRAVENOUS | Status: DC
  Administered 2022-04-27 – 2022-04-30 (×8): 3 mL via INTRAVENOUS

## 2022-04-26 MED ORDER — PROCHLORPERAZINE EDISYLATE 10 MG/2ML IJ SOLN
10.0000 mg | Freq: Once | INTRAMUSCULAR | Status: AC
  Administered 2022-04-26: 10 mg via INTRAVENOUS
  Filled 2022-04-26: qty 2

## 2022-04-26 MED ORDER — HYDROCODONE-ACETAMINOPHEN 5-325 MG PO TABS
1.0000 | ORAL_TABLET | Freq: Once | ORAL | Status: AC
  Administered 2022-04-26: 1 via ORAL
  Filled 2022-04-26: qty 1

## 2022-04-26 MED ORDER — HYDROCODONE-ACETAMINOPHEN 5-325 MG PO TABS: 1.0000 | ORAL_TABLET | Freq: Once | ORAL | Status: DC

## 2022-04-26 MED ORDER — ENOXAPARIN SODIUM 40 MG/0.4ML IJ SOSY
40.0000 mg | PREFILLED_SYRINGE | INTRAMUSCULAR | Status: DC
  Administered 2022-04-27 – 2022-04-29 (×4): 40 mg via SUBCUTANEOUS
  Filled 2022-04-26 (×4): qty 0.4

## 2022-04-26 MED ORDER — DIPHENHYDRAMINE HCL 50 MG/ML IJ SOLN
25.0000 mg | Freq: Once | INTRAMUSCULAR | Status: AC
  Administered 2022-04-26: 25 mg via INTRAVENOUS
  Filled 2022-04-26: qty 1

## 2022-04-26 MED ORDER — TRAMADOL HCL 50 MG PO TABS
50.0000 mg | ORAL_TABLET | Freq: Four times a day (QID) | ORAL | Status: DC | PRN
  Administered 2022-04-27 – 2022-04-28 (×3): 50 mg via ORAL
  Filled 2022-04-26 (×3): qty 1

## 2022-04-26 MED ORDER — DEXAMETHASONE SODIUM PHOSPHATE 4 MG/ML IJ SOLN
4.0000 mg | Freq: Four times a day (QID) | INTRAMUSCULAR | Status: DC
  Administered 2022-04-26 – 2022-04-30 (×15): 4 mg via INTRAVENOUS
  Filled 2022-04-26 (×15): qty 1

## 2022-04-26 NOTE — ED Notes (Signed)
Pt in imaging at this time.

## 2022-04-26 NOTE — ED Provider Notes (Signed)
  Physical Exam  BP 125/74   Pulse 89   Temp 98 F (36.7 C) (Oral)   Resp (!) 22   Ht 5\' 6"  (1.676 m)   Wt 73.5 kg   LMP 04/15/2022 (Exact Date)   SpO2 97%   BMI 26.15 kg/m   Physical Exam  Procedures  Procedures  ED Course / MDM    Medical Decision Making Amount and/or Complexity of Data Reviewed Labs: ordered. Radiology: ordered.  Risk Prescription drug management. Decision regarding hospitalization.   42yo female with right frontal anaplastic astrocytoma with poor follow up who presents with weakness.    Initial diagnosis 08/2016, heaviness in legs, nausea and gait instability. Hospitalized on inpatietn oncology service for progressive left hemiparesis, responded somewhat to increase in steroids per notes.  Avastin therapy, primary oncologist Dr. Maylon Peppers Walking to gas station and leg gave out, persistent since this afternoon, hemiparesis left leg.   CT without acute changes.  Will admit per discussion with Dr. Mickeal Skinner, can obtain sedated MRI as inpatient, initiated decadron.       Gareth Morgan, MD 04/27/22 2314

## 2022-04-26 NOTE — ED Provider Notes (Signed)
[facility] Provider Note  CSN: [id number] Arrival date & time: 04/26/22 1324  Chief Complaint(s) Pain  HPI Wladyslawa Kerner is a 42 y.o. female with PMH astrocytoma, seizure disorder who presents emergency department for evaluation of lower extremity pain and weakness.  Patient states that she had previously had radiation and chemotherapy for her astrocytoma but has had difficulty following up for her follow-up imaging due to a variety of reasons.  She states that today she was walking to get something from the store when her left leg "gave out on her" and she lowered herself to the ground.  She was unable to stand up and her daughter called EMS to bring her to the emergency department for further evaluation.  Patient reportedly has failed multiple attempts at getting an MRI despite appropriate anxiolysis due to severe claustrophobia and is supposed to have an exam under anesthesia in the future.  Here in the emergency room, she endorses left lower extremity weakness and pain but denies chest pain, shortness of breath, abdominal pain, nausea, vomiting or other systemic symptoms.    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 Authorizing Provider  amitriptyline (ELAVIL) 50 MG tablet TAKE 1 TABLET BY MOUTH EVERYDAY AT BEDTIME 03/28/22   Vaslow, Acey Lav, MD  clindamycin (CLEOCIN) 150 MG capsule Take 2 capsules (300 mg total) by mouth 3 (three) times daily. 05/13/21   Quintella Reichert, MD  [employer] SENNA PLUS 8.6-50 MG tablet Take 2 tablets by mouth  daily as needed for moderate constipation. 04/26/21   [provider]  erythromycin ophthalmic ointment Place 1 application. into both eyes at bedtime. 04/02/21   [provider]  HYDROcodone-acetaminophen (NORCO) 5-325 MG tablet Take 1-2 tablets by mouth every 4 (four) hours as needed. 11/05/20   Daleen Bo, MD  ibuprofen (ADVIL) 600 MG tablet Take 600 mg by mouth every 6 (six) hours as needed for moderate pain. 04/26/21   [provider]  ivosidenib (TIBSOVO) 250 MG tablet Take 2 tablets (500 mg total) by mouth daily. Avoid taking with a high-fat meal. 03/28/22   Vaslow, Acey Lav, MD  levETIRAcetam (KEPPRA) 1000 MG tablet Take 1,000 mg by mouth 2 (two) times daily. 11/26/21   [provider]  LORazepam (ATIVAN) 1 MG tablet Take 1 tablet (1 mg total) by mouth 3 (three) times daily as needed for anxiety. Patient taking differently: Take 0.5-1 mg by mouth 3 (three) times daily as needed for anxiety. 11/05/20   Daleen Bo, MD  methocarbamol (ROBAXIN) 500 MG tablet Take 500 mg by mouth 3 (three) times daily as needed for muscle spasms. 04/26/21   [provider]  Multiple Vitamins-Minerals (HM MULTIVITAMIN ADULT GUMMY PO) Take 2 tablets by mouth daily.    [provider]  omeprazole (PRILOSEC) 20 MG capsule Take 20 mg by mouth daily. 04/19/21   [provider]  ondansetron (ZOFRAN) 8 MG tablet Take 8 mg by mouth every 8 (eight) hours as needed for nausea or vomiting. 04/26/21   [provider]  Oxycodone HCl 10  MG TABS Take 1 tablet by mouth every 4 (four) hours as needed. 01/10/22   [provider]                                                                                                                                    Past Surgical History History reviewed. No pertinent surgical history. Family History Family History  Problem Relation Age of Onset   Cancer Mother     Social History Social History   Tobacco  Use   Smoking status: Some Days    Packs/day: 0.25    Years: 10.00    Additional pack years: 0.00    Total pack years: 2.50    Types: Cigarettes   Smokeless tobacco: Never  Vaping Use   Vaping Use: Never used  Substance Use Topics   Alcohol use: Not Currently    Alcohol/week: 2.0 standard drinks of alcohol    Types: 2 Glasses of wine per week    Comment: drinks wine twice a month   Drug use: Not Currently   Allergies Patient has no known allergies.  Review of Systems Review of Systems  Musculoskeletal:  Positive for arthralgias and myalgias.  Neurological:  Positive for weakness.    Physical Exam Vital Signs  I have reviewed the triage vital signs BP 138/86   Pulse 75   Temp 97.9 F (36.6 C) (Oral)   Resp 17   Ht 5\' 6"  (1.676 m)   Wt 73.5 kg   LMP 04/15/2022 (Exact Date)   SpO2 100%   BMI 26.15 kg/m   Physical Exam Vitals and nursing note reviewed.  Constitutional:      General: She is not in acute distress.    Appearance: She is well-developed.  HENT:     Head: Normocephalic and atraumatic.  Eyes:     Conjunctiva/sclera: Conjunctivae normal.  Cardiovascular:     Rate and Rhythm: Normal rate and regular rhythm.     Heart sounds: No murmur heard. Pulmonary:     Effort: Pulmonary effort is normal. No respiratory distress.     Breath sounds: Normal breath sounds.  Abdominal:     Palpations: Abdomen is soft.     Tenderness: There is no abdominal tenderness.  Musculoskeletal:        General: No swelling.     Cervical back: Neck supple.  Skin:    General: Skin is warm and dry.     Capillary Refill: Capillary refill takes less than 2 seconds.  Neurological:     Mental Status: She is alert.     Motor: Weakness present.  Psychiatric:        Mood and Affect: Mood normal.     ED Results and Treatments Labs (all labs ordered are listed, but only abnormal results are displayed) Labs Reviewed  COMPREHENSIVE METABOLIC PANEL - Abnormal; Notable for the  following components:      Result Value  Potassium 3.0 (*)    Glucose, Bld 100 (*)    All other components within normal limits  CBC WITH DIFFERENTIAL/PLATELET - Abnormal; Notable for the following components:   WBC 3.3 (*)    All other components within normal limits  I-STAT CHEM 8, ED - Abnormal; Notable for the following components:   Potassium 3.1 (*)    BUN 4 (*)    Calcium, Ion 1.14 (*)    All other components within normal limits                                                                                                                          Radiology CT Head Wo Contrast  Result Date: 04/26/2022 CLINICAL DATA:  Neuro deficit, acute, stroke suspected EXAM: CT HEAD WITHOUT CONTRAST TECHNIQUE: Contiguous axial images were obtained from the base of the skull through the vertex without intravenous contrast. RADIATION DOSE REDUCTION: This exam was performed according to the departmental dose-optimization program which includes automated exposure control, adjustment of the mA and/or kV according to patient size and/or use of iterative reconstruction technique. COMPARISON:  02/12/2022 FINDINGS: Brain: Again seen is the right frontotemporal mass, grossly unchanged since prior study. 10 mm of right to left midline shift compared to 8 mm previously. No hydrocephalus or hemorrhage. No acute infarction. Vascular: No hyperdense vessel or unexpected calcification. Skull: No acute calvarial abnormality. Sinuses/Orbits: No acute findings Other: None IMPRESSION: Large right frontotemporal mass appears similar to prior study. 10 mm of right to left midline shift. Electronically Signed   By: Rolm Baptise M.D.   On: 04/26/2022 18:08    Pertinent labs & imaging results that were available during my care of the patient were reviewed by me and considered in my medical decision making (see MDM for details).  Medications Ordered in ED Medications  dexamethasone (DECADRON) injection 4 mg (4 mg  Intravenous Given 04/26/22 1853)  HYDROcodone-acetaminophen (NORCO/VICODIN) 5-325 MG per tablet 1 tablet (1 tablet Oral Given 04/26/22 1516)                                                                                                                                     Procedures Procedures  (including critical care time)  Medical Decision Making / ED Course   This patient presents to the ED for concern of lower extremity weakness, this involves an extensive number of treatment options, and  is a complaint that carries with it a high risk of complications and morbidity.  The differential diagnosis includes progression of underlying malignancy, vasogenic edema, CVA, electrolyte abnormality, rhabdomyolysis  MDM: Patient seen emergency room for evaluation of lower extremity edema and weakness.  Physical exam with left lower extremity weakness that appears to be significantly worsened compared to previous documentations of her weakness.  No significant numbness.  Laboratory evaluation with mild hypokalemia to 3.1, mild leukopenia to 3.3 but is otherwise unremarkable.  I initially attempted to obtain a MRI under anesthesia here at [facility] and did speak to the anesthesiologist on-call but unfortunately we do not have MRI compatible ventilators here at [facility] and the patient would have to be sent to [facility] to have the scan done.  I then spoke with the patient's primary oncologist Dr. Mickeal Skinner who states that a CT would be a good place to start as he has had even trouble obtaining this scan and MRIs could be obtained inpatient if necessary.  At time of signout, patient pending CT imaging of the head.  Please see provider signout for continuation of workup.   Additional history obtained:  -External records from outside source obtained and reviewed including: Chart review including previous notes, labs, imaging, consultation notes   Lab Tests: -I ordered, reviewed, and interpreted labs.    The pertinent results include:   Labs Reviewed  COMPREHENSIVE METABOLIC PANEL - Abnormal; Notable for the following components:      Result Value   Potassium 3.0 (*)    Glucose, Bld 100 (*)    All other components within normal limits  CBC WITH DIFFERENTIAL/PLATELET - Abnormal; Notable for the following components:   WBC 3.3 (*)    All other components within normal limits  I-STAT CHEM 8, ED - Abnormal; Notable for the following components:   Potassium 3.1 (*)    BUN 4 (*)    Calcium, Ion 1.14 (*)    All other components within normal limits        Imaging Studies ordered: I ordered imaging studies including CT head and this is pending   Medicines ordered and prescription drug management: Meds ordered this encounter  Medications   DISCONTD: HYDROcodone-acetaminophen (NORCO/VICODIN) 5-325 MG per tablet 1 tablet   DISCONTD: LORazepam (ATIVAN) injection 1 mg   HYDROcodone-acetaminophen (NORCO/VICODIN) 5-325 MG per tablet 1 tablet   dexamethasone (DECADRON) injection 4 mg    -I have reviewed the patients home medicines and have made adjustments as needed  Critical interventions none  Consultations Obtained: I requested consultation with the primary oncologist Dr. Mickeal Skinner and anesthesiologist on-call,  and discussed lab and imaging findings as well as pertinent plan - they recommend: CT head and MRI if necessary inpatient   Cardiac Monitoring: The patient was maintained on a cardiac monitor.  I personally viewed and interpreted the cardiac monitored which showed an underlying rhythm of: NSR  Social Determinants of Health:  Factors impacting patients care include: Missed multiple follow-up appointments due to lack of transportation   Reevaluation: After the interventions noted above, I reevaluated the patient and found that they have :stayed the same  Co morbidities that complicate the patient evaluation  Past Medical History:  Diagnosis Date   Cancer ([place])     Seizures ([place])       Dispostion: I considered admission for this patient, and disposition pending completion of imaging.  Please see provider signout for continuation of workup     Final Clinical Impression(s) /  ED Diagnoses Final diagnoses:  None     @PCDICTATION @    Teressa Lower, MD 04/26/22 1945

## 2022-04-26 NOTE — ED Notes (Signed)
Pt called out requesting help getting back into bed. Pt found on bed on hands and knees. Pt assisted back to bed in lying position. Pt said her phone was ringing and she got up to get her telephone.
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
## 2022-04-26 NOTE — Hospital Course (Signed)
Paula Farmer is a 42 y.o. female with medical history significant for large right frontal anaplastic astrocytoma, tumor related focal seizures, anxiety, medical nonadherence who is admitted with acute on chronic left hemiparesis.
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
## 2022-04-27 ENCOUNTER — Observation Stay (HOSPITAL_COMMUNITY): Payer: Medicaid Other

## 2022-04-27 ENCOUNTER — Other Ambulatory Visit: Payer: Self-pay

## 2022-04-27 DIAGNOSIS — F17210 Nicotine dependence, cigarettes, uncomplicated: Secondary | ICD-10-CM | POA: Diagnosis present

## 2022-04-27 DIAGNOSIS — E876 Hypokalemia: Secondary | ICD-10-CM | POA: Diagnosis present

## 2022-04-27 DIAGNOSIS — Z85841 Personal history of malignant neoplasm of brain: Secondary | ICD-10-CM | POA: Diagnosis not present

## 2022-04-27 DIAGNOSIS — D72819 Decreased white blood cell count, unspecified: Secondary | ICD-10-CM | POA: Diagnosis present

## 2022-04-27 DIAGNOSIS — C719 Malignant neoplasm of brain, unspecified: Secondary | ICD-10-CM | POA: Diagnosis not present

## 2022-04-27 DIAGNOSIS — G893 Neoplasm related pain (acute) (chronic): Secondary | ICD-10-CM | POA: Diagnosis present

## 2022-04-27 DIAGNOSIS — G8194 Hemiplegia, unspecified affecting left nondominant side: Secondary | ICD-10-CM | POA: Diagnosis present

## 2022-04-27 DIAGNOSIS — Z91128 Patient's intentional underdosing of medication regimen for other reason: Secondary | ICD-10-CM | POA: Diagnosis not present

## 2022-04-27 DIAGNOSIS — Z923 Personal history of irradiation: Secondary | ICD-10-CM | POA: Diagnosis not present

## 2022-04-27 DIAGNOSIS — Z91199 Patient's noncompliance with other medical treatment and regimen due to unspecified reason: Secondary | ICD-10-CM | POA: Diagnosis not present

## 2022-04-27 DIAGNOSIS — G935 Compression of brain: Secondary | ICD-10-CM | POA: Diagnosis present

## 2022-04-27 DIAGNOSIS — Z79899 Other long term (current) drug therapy: Secondary | ICD-10-CM | POA: Diagnosis not present

## 2022-04-27 DIAGNOSIS — Z5982 Transportation insecurity: Secondary | ICD-10-CM | POA: Diagnosis not present

## 2022-04-27 DIAGNOSIS — Z66 Do not resuscitate: Secondary | ICD-10-CM | POA: Diagnosis not present

## 2022-04-27 DIAGNOSIS — F411 Generalized anxiety disorder: Secondary | ICD-10-CM | POA: Diagnosis present

## 2022-04-27 DIAGNOSIS — Z5111 Encounter for antineoplastic chemotherapy: Secondary | ICD-10-CM | POA: Diagnosis not present

## 2022-04-27 DIAGNOSIS — Z9221 Personal history of antineoplastic chemotherapy: Secondary | ICD-10-CM | POA: Diagnosis not present

## 2022-04-27 DIAGNOSIS — D496 Neoplasm of unspecified behavior of brain: Secondary | ICD-10-CM | POA: Diagnosis present

## 2022-04-27 DIAGNOSIS — C711 Malignant neoplasm of frontal lobe: Secondary | ICD-10-CM | POA: Diagnosis present

## 2022-04-27 DIAGNOSIS — Z515 Encounter for palliative care: Secondary | ICD-10-CM | POA: Diagnosis not present

## 2022-04-27 DIAGNOSIS — R531 Weakness: Secondary | ICD-10-CM | POA: Diagnosis not present

## 2022-04-27 LAB — CBC
HCT: 42.2 % (ref 36.0–46.0)
Hemoglobin: 13.9 g/dL (ref 12.0–15.0)
MCH: 31.1 pg (ref 26.0–34.0)
MCHC: 32.9 g/dL (ref 30.0–36.0)
MCV: 94.4 fL (ref 80.0–100.0)
Platelets: 189 10*3/uL (ref 150–400)
RBC: 4.47 MIL/uL (ref 3.87–5.11)
RDW: 12.5 % (ref 11.5–15.5)
WBC: 3.1 10*3/uL — ABNORMAL LOW (ref 4.0–10.5)
nRBC: 0 % (ref 0.0–0.2)

## 2022-04-27 LAB — BASIC METABOLIC PANEL
Anion gap: 10 (ref 5–15)
BUN: 7 mg/dL (ref 6–20)
CO2: 25 mmol/L (ref 22–32)
Calcium: 9.3 mg/dL (ref 8.9–10.3)
Chloride: 103 mmol/L (ref 98–111)
Creatinine, Ser: 0.58 mg/dL (ref 0.44–1.00)
GFR, Estimated: >60 mL/min (ref >60)
Glucose, Bld: 129 mg/dL — ABNORMAL HIGH (ref 70–99)
Potassium: 3.7 mmol/L (ref 3.5–5.1)
Sodium: 138 mmol/L (ref 135–145)

## 2022-04-27 LAB — HIV ANTIBODY (ROUTINE TESTING W REFLEX): HIV Screen 4th Generation wRfx: NONREACTIVE

## 2022-04-27 LAB — MAGNESIUM: Magnesium: 2.4 mg/dL (ref 1.7–2.4)

## 2022-04-27 MED ORDER — GADOBUTROL 1 MMOL/ML IV SOLN
7.5000 mL | Freq: Once | INTRAVENOUS | Status: AC | PRN
  Administered 2022-04-27: 7.5 mL via INTRAVENOUS

## 2022-04-27 MED ORDER — HYDROMORPHONE HCL 1 MG/ML IJ SOLN
1.0000 mg | Freq: Once | INTRAMUSCULAR | Status: AC
  Administered 2022-04-27: 1 mg via INTRAVENOUS
  Filled 2022-04-27: qty 1

## 2022-04-27 MED ORDER — HYDROMORPHONE HCL 1 MG/ML IJ SOLN: 0.5000 mg | Freq: Once | INTRAMUSCULAR | Status: AC

## 2022-04-27 MED ORDER — LEVETIRACETAM IN NACL 1000 MG/100ML IV SOLN
1000.0000 mg | Freq: Once | INTRAVENOUS | Status: AC
  Administered 2022-04-27: 1000 mg via INTRAVENOUS
  Filled 2022-04-27: qty 100

## 2022-04-27 NOTE — Progress Notes (Signed)
PROGRESS NOTE    Paula Farmer  [id number] DOB: 1980-09-02 DOA: 04/26/2022 PCP: Patient, No Pcp Per  Outpatient Specialists:     Brief Narrative:  Patient is a 42 year old female with past medical history significant for large right frontal anaplastic astrocytoma, tumor related focal seizures, anxiety and medical nonadherence.  Patient was admitted with left-sided hemiparesis/hemiplegia.  Patient is currently on IV Decadron.  Input from the physical therapy.  Patient with therapy slowly appreciated.  Will consult palliative care team.  Continue neurochecks.  Patient has not been very compliant with management by the neuro oncologist.  MRI brain revealed: "1. No evidence of acute ischemic stroke. 2. Continued disease progression of the patient's known astrocytoma, the epicenter in the right insular region. Increased regional mass effect with right-to-left midline shift of 9 mm, increased from 8 mm in November. Progressive involvement including the hypothalamus, midbrain, brainstem and early involvement of the right cerebellum".   Assessment & Plan:   Principal Problem:   Anaplastic astrocytoma ([place]) Active Problems:   Left hemiparesis ([place])   History of seizure   Hypokalemia   Brain tumor ([place])   Large right frontal anaplastic astrocytoma with left-sided hemiparesis: Presenting with presumably worsened left hemiparesis compared to recent baseline.  Patient has missed multiple oncology appointments and not adherent to medical treatment as well as scheduled imaging studies.  Repeat CT head again shows large right frontotemporal mass with 10 mm of right to left midline shift.  Discussed with neuro-oncology, Dr. Mickeal Skinner.  This may be due to progression of tumor versus rebound effect related to missed Avastin treatment. -IV Decadron 4 mg every 6 hours -Obtain MRI brain with and without contrast if able, has needed to be performed under sedation in the past -PT/OT  eval -Continue neurochecks -Dr. Mickeal Skinner available via secure chat over the weekend -Consider palliative medicine consult 04/27/2022: MRI of the brain is as documented above.  PT OT input is appreciated.  Consult palliative care team.   History of focal seizures: Restart Keppra 1000 mg twice daily. 04/27/2022: Patient is on Keppra 1000 Mg p.o. twice daily.   Hypokalemia: Oral supplement ordered.  Check magnesium. 04/27/2022: Potassium is 3.7 and magnesium is 2.4.  Continue to monitor and replace.   DVT prophylaxis: Lovenox. Code Status: Full code. Family Communication:  Disposition Plan: Patient may be placed.   Consultants:  Case was discussed between the admitting hospitalist and the neuro-oncology team.    Procedures:  None.  Antimicrobials:  None.   Subjective: Left-sided weakness.  Objective: Vitals:   04/26/22 2247 04/27/22 0315 04/27/22 0916 04/27/22 1225  BP: (!) 142/92 (!) 134/95 (!) 147/98 (!) 137/101  Pulse: (!) 57 80 83 79  Resp: 20 17 16 16   Temp: 97.6 F (36.4 C) 97.8 F (36.6 C) 97.8 F (36.6 C) 98.5 F (36.9 C)  TempSrc: Oral Oral Oral Oral  SpO2: 100% 100% 100% 99%  Weight:      Height:        Intake/Output Summary (Last 24 hours) at 04/27/2022 1616 Last data filed at 04/27/2022 1439 Gross per 24 hour  Intake 840 ml  Output 0 ml  Net 840 ml   Filed Weights   04/26/22 1340  Weight: 73.5 kg    Examination:  General exam: Appears calm and comfortable  Respiratory system: Clear to auscultation.  Cardiovascular system: S1 & S2 heard Gastrointestinal system: Abdomen is nondistended, soft and nontender. No organomegaly or masses felt. Normal bowel sounds heard. Central nervous system: Alert and oriented.  Left-sided hemiparesis/hemiplegia.   Extremities: No leg edema.     Data Reviewed: I have personally reviewed following labs and imaging studies  CBC: Recent Labs  Lab 04/26/22 1440 04/26/22 1448 04/27/22 0416  WBC 3.3*  --  3.1*   NEUTROABS 2.0  --   --   HGB 13.5 14.3 13.9  HCT 42.1 42.0 42.2  MCV 94.8  --  94.4  PLT 184  --  99991111   Basic Metabolic Panel: Recent Labs  Lab 04/26/22 1440 04/26/22 1448 04/27/22 0416  NA 139 142 138  K 3.0* 3.1* 3.7  CL 103 101 103  CO2 27  --  25  GLUCOSE 100* 96 129*  BUN 6 4* 7  CREATININE 0.65 0.70 0.58  CALCIUM 9.0  --  9.3  MG  --   --  2.4   GFR: Estimated Creatinine Clearance: 95.9 mL/min (by C-G formula based on SCr of 0.58 mg/dL). Liver Function Tests: Recent Labs  Lab 04/26/22 1440  AST 21  ALT 15  ALKPHOS 46  BILITOT 0.6  PROT 7.3  ALBUMIN 4.3   No results for input(s): "LIPASE", "AMYLASE" in the last 168 hours. No results for input(s): "AMMONIA" in the last 168 hours. Coagulation Profile: No results for input(s): "INR", "PROTIME" in the last 168 hours. Cardiac Enzymes: No results for input(s): "CKTOTAL", "CKMB", "CKMBINDEX", "TROPONINI" in the last 168 hours. BNP (last 3 results) No results for input(s): "PROBNP" in the last 8760 hours. HbA1C: No results for input(s): "HGBA1C" in the last 72 hours. CBG: No results for input(s): "GLUCAP" in the last 168 hours. Lipid Profile: No results for input(s): "CHOL", "HDL", "LDLCALC", "TRIG", "CHOLHDL", "LDLDIRECT" in the last 72 hours. Thyroid Function Tests: No results for input(s): "TSH", "T4TOTAL", "FREET4", "T3FREE", "THYROIDAB" in the last 72 hours. Anemia Panel: No results for input(s): "VITAMINB12", "FOLATE", "FERRITIN", "TIBC", "IRON", "RETICCTPCT" in the last 72 hours. Urine analysis:    Component Value Date/Time   PROTEINUR NEGATIVE 03/28/2022 1205   Sepsis Labs: @LABRCNTIP (procalcitonin:4,lacticidven:4)  )No results found for this or any previous visit (from the past 240 hour(s)).       Radiology Studies: MR BRAIN W WO CONTRAST  Result Date: 04/27/2022 CLINICAL DATA:  Neuro deficit, acute, stroke suspected. Left lower extremity weakness. History of astrocytoma. EXAM: MRI HEAD  WITHOUT AND WITH CONTRAST TECHNIQUE: Multiplanar, multiecho pulse sequences of the brain and surrounding structures were obtained without and with intravenous contrast. CONTRAST:  7.45mL GADAVIST GADOBUTROL 1 MMOL/ML IV SOLN COMPARISON:  Head CT yesterday.  MRI 12/20/2021.  MRI 03/15/2021. FINDINGS: Brain: I do not see a finding to suggest acute ischemic stroke. There is continued disease progression of the infiltrative mass lesion the epicenter in the right insular region. Increased regional mass effect. Increased regions of indistinct contrast enhancement. Multiple internal cystic changes. Tumor as shows extension into the right cerebral peduncle, midbrain, pons and probably with early involvement of the right cerebellum. Tumor involvement of the hypothalamic region is progressive. Overall degree of mass effect is slightly further increased, with right-to-left midline shift of 9 mm, increased from 8 mm in November. No hydrocephalus. No extra-axial fluid collection. Vascular: Major vessels at the base of the brain show flow. Skull and upper cervical spine: Negative Sinuses/Orbits: Clear/normal Other: None IMPRESSION: 1. No evidence of acute ischemic stroke. 2. Continued disease progression of the patient's known astrocytoma, the epicenter in the right insular region. Increased regional mass effect with right-to-left midline shift of 9 mm, increased from 8 mm in November.  Progressive involvement including the hypothalamus, midbrain, brainstem and early involvement of the right cerebellum. Electronically Signed   By: Nelson Chimes M.D.   On: 04/27/2022 14:45   CT Head Wo Contrast  Result Date: 04/26/2022 CLINICAL DATA:  Neuro deficit, acute, stroke suspected EXAM: CT HEAD WITHOUT CONTRAST TECHNIQUE: Contiguous axial images were obtained from the base of the skull through the vertex without intravenous contrast. RADIATION DOSE REDUCTION: This exam was performed according to the departmental dose-optimization program  which includes automated exposure control, adjustment of the mA and/or kV according to patient size and/or use of iterative reconstruction technique. COMPARISON:  02/12/2022 FINDINGS: Brain: Again seen is the right frontotemporal mass, grossly unchanged since prior study. 10 mm of right to left midline shift compared to 8 mm previously. No hydrocephalus or hemorrhage. No acute infarction. Vascular: No hyperdense vessel or unexpected calcification. Skull: No acute calvarial abnormality. Sinuses/Orbits: No acute findings Other: None IMPRESSION: Large right frontotemporal mass appears similar to prior study. 10 mm of right to left midline shift. Electronically Signed   By: Rolm Baptise M.D.   On: 04/26/2022 18:08        Scheduled Meds:  dexamethasone (DECADRON) injection  4 mg Intravenous Q6H   enoxaparin (LOVENOX) injection  40 mg Subcutaneous Q24H   levETIRAcetam  1,000 mg Oral BID   sodium chloride flush  3 mL Intravenous Q12H   Continuous Infusions:   LOS: 0 days    Time spent: 35 minutes.    Dana Allan, MD  [facility] Pager #: [phone] 7PM-7AM contact night coverage as above

## 2022-04-27 NOTE — Progress Notes (Signed)
Inpatient Rehab Admissions Coordinator:   Per therapy recommendations, patient was screened for CIR candidacy by Clemens Catholic, MS, CCC-SLP. At this time,  Pt remains observation status and it is not clear she has medical necessity to justify inpatient status . I will not place consult for now but can rescreen if Pt.is admitted. Please contact me with any questions.   Clemens Catholic, [place], [place] Admissions Coordinator  [phone] ([place]) [phone] (office)
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
## 2022-04-27 NOTE — Progress Notes (Signed)
MRI originally ordered through ED yesterday. Pt has known history of claustrophobia and being unable to complete MRI without anesthesia. Anesthesia unable to be performed at [facility] and [facility] already had a case going yesterday when this MRI was ordered. Pt finally admitted to floor last night and MRI still pending this morning. I reached out to the RN to ask about pt cooperation without anesthesia and RN said pt has successfully completed MRI with Dilaudid given prior. Attending MD approved med order and Dilaudid administered to pt immediately prior to pt coming down for MRI. Pt had no issues tolerating MRI Brain w/wo contrast today with Dilaudid on board. Pt was still, cooperative and sleepy.
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
## 2022-04-28 DIAGNOSIS — R531 Weakness: Secondary | ICD-10-CM

## 2022-04-28 DIAGNOSIS — K5900 Constipation, unspecified: Secondary | ICD-10-CM

## 2022-04-28 DIAGNOSIS — C719 Malignant neoplasm of brain, unspecified: Secondary | ICD-10-CM | POA: Diagnosis not present

## 2022-04-28 DIAGNOSIS — G893 Neoplasm related pain (acute) (chronic): Secondary | ICD-10-CM

## 2022-04-28 DIAGNOSIS — Z87898 Personal history of other specified conditions: Secondary | ICD-10-CM

## 2022-04-28 DIAGNOSIS — Z7189 Other specified counseling: Secondary | ICD-10-CM

## 2022-04-28 DIAGNOSIS — D496 Neoplasm of unspecified behavior of brain: Secondary | ICD-10-CM | POA: Diagnosis not present

## 2022-04-28 DIAGNOSIS — Z515 Encounter for palliative care: Secondary | ICD-10-CM | POA: Diagnosis not present

## 2022-04-28 DIAGNOSIS — G8194 Hemiplegia, unspecified affecting left nondominant side: Secondary | ICD-10-CM

## 2022-04-28 DIAGNOSIS — Z79899 Other long term (current) drug therapy: Secondary | ICD-10-CM

## 2022-04-28 MED ORDER — OXYCODONE HCL 5 MG PO TABS
10.0000 mg | ORAL_TABLET | ORAL | Status: DC | PRN
  Administered 2022-04-28 – 2022-04-29 (×2): 10 mg via ORAL
  Filled 2022-04-28 (×3): qty 2

## 2022-04-28 MED ORDER — SENNA 8.6 MG PO TABS
1.0000 | ORAL_TABLET | Freq: Every day | ORAL | Status: DC
  Administered 2022-04-28: 8.6 mg via ORAL
  Filled 2022-04-28 (×3): qty 1

## 2022-04-28 MED ORDER — MORPHINE SULFATE (PF) 2 MG/ML IV SOLN: 2.0000 mg | INTRAVENOUS | Status: DC | PRN

## 2022-04-28 MED ORDER — POLYETHYLENE GLYCOL 3350 17 G PO PACK
17.0000 g | PACK | Freq: Every day | ORAL | Status: DC
  Administered 2022-04-28: 17 g via ORAL
  Filled 2022-04-28 (×4): qty 1
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
## 2022-04-28 NOTE — Progress Notes (Signed)
PROGRESS NOTE    Paula Farmer  [id number] DOB: Dec 11, 1980 DOA: 04/26/2022 PCP: Patient, No Pcp Per  Outpatient Specialists:     Brief Narrative:  Patient is a 42 year old female with past medical history significant for large right frontal anaplastic astrocytoma, tumor related focal seizures, anxiety and medical nonadherence.  Patient was admitted with left-sided hemiparesis/hemiplegia.  Patient is currently on IV Decadron.  Input from the physical therapy.  Patient with therapy slowly appreciated.  Will consult palliative care team.  Continue neurochecks.  Patient has not been very compliant with management by the neuro oncologist.  MRI brain revealed: "1. No evidence of acute ischemic stroke. 2. Continued disease progression of the patient's known astrocytoma, the epicenter in the right insular region. Increased regional mass effect with right-to-left midline shift of 9 mm, increased from 8 mm in November. Progressive involvement including the hypothalamus, midbrain, brainstem and early involvement of the right cerebellum".  04/28/2022: Patient seen.  No new changes.  Left-sided hemiplegia persists.  Communicated with Dr. Mickeal Skinner, Neuro oncologist.  Dr. Mickeal Skinner will see patient tomorrow.  Palliative care input is appreciated.   Assessment & Plan:   Principal Problem:   Anaplastic astrocytoma ([place]) Active Problems:   Left hemiparesis ([place])   History of seizure   Hypokalemia   Brain tumor ([place])   Large right frontal anaplastic astrocytoma with left-sided hemiparesis: Presenting with presumably worsened left hemiparesis compared to recent baseline.  Patient has missed multiple oncology appointments and not adherent to medical treatment as well as scheduled imaging studies.  Repeat CT head again shows large right frontotemporal mass with 10 mm of right to left midline shift.  Discussed with neuro-oncology, Dr. Mickeal Skinner.  This may be due to progression of tumor versus rebound  effect related to missed Avastin treatment. -IV Decadron 4 mg every 6 hours -Obtain MRI brain with and without contrast if able, has needed to be performed under sedation in the past -PT/OT eval -Continue neurochecks -Dr. Mickeal Skinner available via secure chat over the weekend -Consider palliative medicine consult 04/27/2022: MRI of the brain is as documented above.  PT OT input is appreciated.  Consult palliative care team.   History of focal seizures: Restart Keppra 1000 mg twice daily. 04/27/2022: Patient is on Keppra 1000 Mg p.o. twice daily.   Hypokalemia: Oral supplement ordered.  Check magnesium. 04/27/2022: Potassium is 3.7 and magnesium is 2.4.  Continue to monitor and replace. Repeat renal panel in the morning.   DVT prophylaxis: Lovenox. Code Status: Full code. Family Communication:  Disposition Plan: Patient may be placed.   Consultants:  Case was discussed between the admitting hospitalist and the neuro-oncology team.   Palliative care team.  Procedures:  None.  Antimicrobials:  None.   Subjective: Left-sided weakness/hemiplegia.  Objective: Vitals:   04/27/22 0916 04/27/22 1225 04/27/22 2024 04/28/22 0541  BP: (!) 147/98 (!) 137/101 135/88 125/83  Pulse: 83 79 73 70  Resp: 16 16 (!) 22 16  Temp: 97.8 F (36.6 C) 98.5 F (36.9 C) 97.9 F (36.6 C) 98.2 F (36.8 C)  TempSrc: Oral Oral Oral Oral  SpO2: 100% 99% 99% 99%  Weight:      Height:        Intake/Output Summary (Last 24 hours) at 04/28/2022 1628 Last data filed at 04/28/2022 0939 Gross per 24 hour  Intake 246 ml  Output 150 ml  Net 96 ml    Filed Weights   04/26/22 1340  Weight: 73.5 kg    Examination:  General exam:  Appears calm and comfortable  Respiratory system: Clear to auscultation.  Cardiovascular system: S1 & S2 heard Gastrointestinal system: Abdomen is nondistended, soft and nontender. No organomegaly or masses felt. Normal bowel sounds heard. Central nervous system: Alert and  oriented.  Left-sided hemiplegia.   Extremities: No leg edema.     Data Reviewed: I have personally reviewed following labs and imaging studies  CBC: Recent Labs  Lab 04/26/22 1440 04/26/22 1448 04/27/22 0416  WBC 3.3*  --  3.1*  NEUTROABS 2.0  --   --   HGB 13.5 14.3 13.9  HCT 42.1 42.0 42.2  MCV 94.8  --  94.4  PLT 184  --  99991111    Basic Metabolic Panel: Recent Labs  Lab 04/26/22 1440 04/26/22 1448 04/27/22 0416  NA 139 142 138  K 3.0* 3.1* 3.7  CL 103 101 103  CO2 27  --  25  GLUCOSE 100* 96 129*  BUN 6 4* 7  CREATININE 0.65 0.70 0.58  CALCIUM 9.0  --  9.3  MG  --   --  2.4    GFR: Estimated Creatinine Clearance: 95.9 mL/min (by C-G formula based on SCr of 0.58 mg/dL). Liver Function Tests: Recent Labs  Lab 04/26/22 1440  AST 21  ALT 15  ALKPHOS 46  BILITOT 0.6  PROT 7.3  ALBUMIN 4.3    No results for input(s): "LIPASE", "AMYLASE" in the last 168 hours. No results for input(s): "AMMONIA" in the last 168 hours. Coagulation Profile: No results for input(s): "INR", "PROTIME" in the last 168 hours. Cardiac Enzymes: No results for input(s): "CKTOTAL", "CKMB", "CKMBINDEX", "TROPONINI" in the last 168 hours. BNP (last 3 results) No results for input(s): "PROBNP" in the last 8760 hours. HbA1C: No results for input(s): "HGBA1C" in the last 72 hours. CBG: No results for input(s): "GLUCAP" in the last 168 hours. Lipid Profile: No results for input(s): "CHOL", "HDL", "LDLCALC", "TRIG", "CHOLHDL", "LDLDIRECT" in the last 72 hours. Thyroid Function Tests: No results for input(s): "TSH", "T4TOTAL", "FREET4", "T3FREE", "THYROIDAB" in the last 72 hours. Anemia Panel: No results for input(s): "VITAMINB12", "FOLATE", "FERRITIN", "TIBC", "IRON", "RETICCTPCT" in the last 72 hours. Urine analysis:    Component Value Date/Time   PROTEINUR NEGATIVE 03/28/2022 1205   Sepsis Labs: @LABRCNTIP (procalcitonin:4,lacticidven:4)  )No results found for this or any  previous visit (from the past 240 hour(s)).       Radiology Studies: MR BRAIN W WO CONTRAST  Result Date: 04/27/2022 CLINICAL DATA:  Neuro deficit, acute, stroke suspected. Left lower extremity weakness. History of astrocytoma. EXAM: MRI HEAD WITHOUT AND WITH CONTRAST TECHNIQUE: Multiplanar, multiecho pulse sequences of the brain and surrounding structures were obtained without and with intravenous contrast. CONTRAST:  7.62mL GADAVIST GADOBUTROL 1 MMOL/ML IV SOLN COMPARISON:  Head CT yesterday.  MRI 12/20/2021.  MRI 03/15/2021. FINDINGS: Brain: I do not see a finding to suggest acute ischemic stroke. There is continued disease progression of the infiltrative mass lesion the epicenter in the right insular region. Increased regional mass effect. Increased regions of indistinct contrast enhancement. Multiple internal cystic changes. Tumor as shows extension into the right cerebral peduncle, midbrain, pons and probably with early involvement of the right cerebellum. Tumor involvement of the hypothalamic region is progressive. Overall degree of mass effect is slightly further increased, with right-to-left midline shift of 9 mm, increased from 8 mm in November. No hydrocephalus. No extra-axial fluid collection. Vascular: Major vessels at the base of the brain show flow. Skull and upper cervical spine: Negative Sinuses/Orbits:  Clear/normal Other: None IMPRESSION: 1. No evidence of acute ischemic stroke. 2. Continued disease progression of the patient's known astrocytoma, the epicenter in the right insular region. Increased regional mass effect with right-to-left midline shift of 9 mm, increased from 8 mm in November. Progressive involvement including the hypothalamus, midbrain, brainstem and early involvement of the right cerebellum. Electronically Signed   By: Nelson Chimes M.D.   On: 04/27/2022 14:45   CT Head Wo Contrast  Result Date: 04/26/2022 CLINICAL DATA:  Neuro deficit, acute, stroke suspected EXAM: CT  HEAD WITHOUT CONTRAST TECHNIQUE: Contiguous axial images were obtained from the base of the skull through the vertex without intravenous contrast. RADIATION DOSE REDUCTION: This exam was performed according to the departmental dose-optimization program which includes automated exposure control, adjustment of the mA and/or kV according to patient size and/or use of iterative reconstruction technique. COMPARISON:  02/12/2022 FINDINGS: Brain: Again seen is the right frontotemporal mass, grossly unchanged since prior study. 10 mm of right to left midline shift compared to 8 mm previously. No hydrocephalus or hemorrhage. No acute infarction. Vascular: No hyperdense vessel or unexpected calcification. Skull: No acute calvarial abnormality. Sinuses/Orbits: No acute findings Other: None IMPRESSION: Large right frontotemporal mass appears similar to prior study. 10 mm of right to left midline shift. Electronically Signed   By: Rolm Baptise M.D.   On: 04/26/2022 18:08        Scheduled Meds:  dexamethasone (DECADRON) injection  4 mg Intravenous Q6H   enoxaparin (LOVENOX) injection  40 mg Subcutaneous Q24H   levETIRAcetam  1,000 mg Oral BID   polyethylene glycol  17 g Oral Daily   senna  1 tablet Oral Daily   sodium chloride flush  3 mL Intravenous Q12H   Continuous Infusions:   LOS: 1 day    Time spent: 35 minutes.    Dana Allan, MD  [facility] Pager #: [phone] 7PM-7AM contact night coverage as above

## 2022-04-28 NOTE — TOC Initial Note (Signed)
Transition of Care [facility]) - Initial/Assessment Note    Patient Details  Name: Paula Farmer MRN: [id number] Date of Birth: 1980/10/08  [facility] of [facility] CM/SW Contact:    Rodney Booze, LCSW Phone Number: 04/28/2022, 2:51 PM  Clinical Narrative:                 This CSW has met the patient at the bedside. The patient stated that she is in need of a shower chair, she also reports that she has all she needs at home. Patient did ask about transportation as well food pantries. This CSW will add resources also advised patient to follow up with [facility] to see if they offer help with getting to her appointments. This CSW handed the patient a pocket to food pantries in [place]. Patient reports not having a cell phone due to financial issues. At this time patient is awaiting to be DC within the next couple of days. This CSW asked that patient about how she was getting home patient states she thinks a friend is coming in the event the friend does not come TOC will provide a taxi voucher for patient to get home. TOC will continue to follow.  Expected Discharge Plan: Home/Self Care Barriers to Discharge: No Barriers Identified   Patient Goals and CMS Choice Patient states their goals for this hospitalization and ongoing recovery are:: Patient would like a shower chair          Expected Discharge Plan and Services       Living arrangements for the past 2 months: Single Family Home                                      Prior Living Arrangements/Services Living arrangements for the past 2 months: Single Family Home Lives with:: Relatives Patient language and need for interpreter reviewed:: No Do you feel safe going back to the place where you live?: Yes      Need for Family Participation in Patient Care: No (Comment) Care giver support system in place?: Yes (comment)   Criminal Activity/Legal Involvement Pertinent to Current Situation/Hospitalization: No - Comment as  needed  Activities of Daily Living Home Assistive Devices/Equipment: Other (Comment) (unsure) ADL Screening (condition at time of admission) Patient's cognitive ability adequate to safely complete daily activities?: Yes Is the patient deaf or have difficulty hearing?: No Does the patient have difficulty seeing, even when wearing glasses/contacts?: No Does the patient have difficulty concentrating, remembering, or making decisions?: No Patient able to express need for assistance with ADLs?: No Does the patient have difficulty dressing or bathing?: Yes Independently performs ADLs?: No Communication: Independent Dressing (OT): Independent Grooming: Needs assistance Is this a change from baseline?: Change from baseline, expected to last <3 days Feeding: Independent Bathing: Needs assistance Is this a change from baseline?: Change from baseline, expected to last >3 days Toileting: Needs assistance Is this a change from baseline?: Change from baseline, expected to last >3days In/Out Bed: Needs assistance Is this a change from baseline?: Change from baseline, expected to last >3 days Does the patient have difficulty walking or climbing stairs?: Yes Weakness of Legs: Left Weakness of Arms/Hands: Left  Permission Sought/Granted                  Emotional Assessment Appearance:: Appears stated age Attitude/Demeanor/Rapport: Gracious Affect (typically observed): Calm Orientation: : Oriented to Self, Oriented to Place, Oriented to  Time, Oriented to Situation Alcohol / Substance Use: Not Applicable Psych Involvement: No (comment)  Admission diagnosis:  Anaplastic astrocytoma [C71.9] Brain tumor [D49.6] Patient Active Problem List   Diagnosis Date Noted   Brain tumor ([place]) 04/27/2022   Left hemiparesis ([place]) 04/26/2022   History of seizure 04/26/2022   Hypokalemia 04/26/2022   Focal seizures ([place]) 02/21/2022   Vasogenic edema ([place])    Headache 07/17/2020   Intractable  headache 07/17/2020   GAD (generalized anxiety disorder) 11/20/2018   Anaplastic astrocytoma ([place]) 09/18/2016   PCP:  Patient, No Pcp Per Pharmacy:   [id number] - [place], [place] [address] Phone: [phone] Fax: [phone]  [facility] #[id number] - [place], [place] [id number] [place] [id number] Phone: [phone] Fax: [phone]  [place] [address] Phone: [phone] Fax: [phone]     Social Determinants of Health (SDOH) Social History: SDOH Screenings   Tobacco Use: High Risk (04/26/2022)   SDOH Interventions:     Readmission Risk Interventions    04/28/2022    2:48 PM  Readmission Risk Prevention Plan  Post Dischage Appt Complete  Medication Screening Complete  Transportation Screening Complete

## 2022-04-28 NOTE — Progress Notes (Signed)
Inpatient Rehab Admissions Coordinator:  ? ?Per therapy recommendations,  patient was screened for CIR candidacy by Yevette Knust, MS, CCC-SLP. At this time, Pt. Appears to be a a potential candidate for CIR. I will place   order for rehab consult per protocol for full assessment. Please contact me any with questions. ? ?Rita Prom, MS, CCC-SLP ?Rehab Admissions Coordinator  ?[phone] (celll) ?[phone] (office) ? ?

## 2022-04-28 NOTE — Progress Notes (Signed)
Physical Therapy Treatment Patient Details Name: Paula Farmer MRN: [id number] DOB: [birth year] Today's Date: 04/28/2022   History of Present Illness 42 y.o. female with medical history significant for large right frontal anaplastic astrocytoma, tumor related focal seizures, anxiety, medical nonadherence who is admitted 04/26/22 with acute on chronic left hemiparesis.    PT Comments    Pt assisted with ambulating however required rest breaks as well as cues for task as pt very tangential.  Pt continues to present with left visual neglect and left LE hemiparesis.  Pt also with significant gait pattern deviations as below.  Pt would not be able to safely mobilize without physical assist as well as require safety and processing cues upon d/c at this time.    Recommendations for follow up therapy are one component of a multi-disciplinary discharge planning process, led by the attending physician.  Recommendations may be updated based on patient status, additional functional criteria and insurance authorization.  Follow Up Recommendations       Assistance Recommended at Discharge Intermittent Supervision/Assistance  Patient can return home with the following A little help with walking and/or transfers;A little help with bathing/dressing/bathroom;Assistance with cooking/housework;Direct supervision/assist for financial management;Assist for transportation;Direct supervision/assist for medications management;Help with stairs or ramp for entrance   Equipment Recommendations  None recommended by PT    Recommendations for Other Services       Precautions / Restrictions Precautions Precautions: Fall Precaution Comments: left hemiparesis, left visual neglect Restrictions Weight Bearing Restrictions: No     Mobility  Bed Mobility Overal bed mobility: Needs Assistance Bed Mobility: Supine to Sit     Supine to sit: Min guard     General bed mobility comments: pt self assisting left  extremities    Transfers Overall transfer level: Needs assistance Equipment used: Straight cane Transfers: Sit to/from Stand Sit to Stand: Min assist           General transfer comment: assist for stabilizing, cues for safe technique    Ambulation/Gait Ambulation/Gait assistance: Mod assist Gait Distance (Feet): 20 Feet Assistive device: Straight cane Gait Pattern/deviations: Step-to pattern, Narrow base of support, Decreased weight shift to left, Knee hyperextension - left, Decreased dorsiflexion - left       General Gait Details: pt with less flexor tone in L UE today however not able to grip and hold RW, provided support under left forearm, requiring mod assist for stabilizing, observed more left knee hyperextension at times, also dragging Lt LE behind and decreased DF, cues for sequencing and safety; pt ambulated a few feet to [facility] then requested a rest break in sitting so ambulated a few feet to recliner and then ambulated to doorway of room; overall approx 20 feet total; requires directing attention to anything off to her left side   Stairs             Wheelchair Mobility    Modified Rankin (Stroke Patients Only)       Balance Overall balance assessment: History of Falls, Needs assistance         Standing balance support: Single extremity supported, Reliant on assistive device for balance Standing balance-Leahy Scale: Poor                              Cognition Arousal/Alertness: Awake/alert Behavior During Therapy: WFL for tasks assessed/performed Overall Cognitive Status: No family/caregiver present to determine baseline cognitive functioning Area of Impairment: Safety/judgement  Safety/Judgement: Decreased awareness of deficits, Decreased awareness of safety     General Comments: tangential, poor short term memory, poor safety awareness        Exercises      General Comments        Pertinent  Vitals/Pain Pain Assessment Pain Assessment: Faces Faces Pain Scale: Hurts little more Pain Location: neck Pain Descriptors / Indicators: Sore Pain Intervention(s): Repositioned, Monitored during session    Home Living                          Prior Function            PT Goals (current goals can now be found in the care plan section) Progress towards PT goals: Progressing toward goals    Frequency    Min 3X/week      PT Plan Current plan remains appropriate    Co-evaluation              AM-PAC PT "6 Clicks" Mobility   Outcome Measure  Help needed turning from your back to your side while in a flat bed without using bedrails?: A Little Help needed moving from lying on your back to sitting on the side of a flat bed without using bedrails?: A Little Help needed moving to and from a bed to a chair (including a wheelchair)?: A Lot Help needed standing up from a chair using your arms (e.g., wheelchair or bedside chair)?: A Lot Help needed to walk in hospital room?: A Lot Help needed climbing 3-5 steps with a railing? : Total 6 Click Score: 13    End of Session Equipment Utilized During Treatment: Gait belt Activity Tolerance: Patient tolerated treatment well Patient left: with call bell/phone within reach;in chair;with chair alarm set Nurse Communication: Mobility status PT Visit Diagnosis: Difficulty in walking, not elsewhere classified (R26.2);Hemiplegia and hemiparesis Hemiplegia - Right/Left: Left Hemiplegia - caused by:  (large right frontal anaplastic astrocytoma)     Time: [id number] PT Time Calculation (min) (ACUTE ONLY): 23 min  Charges:  $Gait Training: 23-37 mins                    Arlyce Dice, DPT Physical Therapist Acute Rehabilitation Services Office: [place] 04/28/2022, 3:45 PM
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
## 2022-04-28 NOTE — Plan of Care (Signed)
  Problem: Education: Goal: Knowledge of General Education information will improve Description Including pain rating scale, medication(s)/side effects and non-pharmacologic comfort measures Outcome: Progressing   Problem: Health Behavior/Discharge Planning: Goal: Ability to manage health-related needs will improve Outcome: Progressing   
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
## 2022-04-29 DIAGNOSIS — D496 Neoplasm of unspecified behavior of brain: Secondary | ICD-10-CM | POA: Diagnosis not present

## 2022-04-29 DIAGNOSIS — Z515 Encounter for palliative care: Secondary | ICD-10-CM

## 2022-04-29 DIAGNOSIS — G893 Neoplasm related pain (acute) (chronic): Secondary | ICD-10-CM

## 2022-04-29 DIAGNOSIS — R531 Weakness: Secondary | ICD-10-CM | POA: Diagnosis not present

## 2022-04-29 DIAGNOSIS — Z7189 Other specified counseling: Secondary | ICD-10-CM

## 2022-04-29 DIAGNOSIS — C719 Malignant neoplasm of brain, unspecified: Secondary | ICD-10-CM | POA: Diagnosis not present

## 2022-04-29 DIAGNOSIS — Z79899 Other long term (current) drug therapy: Secondary | ICD-10-CM

## 2022-04-29 MED ORDER — OXYCODONE HCL 5 MG PO TABS
15.0000 mg | ORAL_TABLET | ORAL | Status: DC | PRN
  Administered 2022-04-29 – 2022-04-30 (×2): 15 mg via ORAL
  Filled 2022-04-29 (×2): qty 3

## 2022-04-29 MED ORDER — MORPHINE SULFATE ER 15 MG PO TBCR
15.0000 mg | EXTENDED_RELEASE_TABLET | Freq: Every day | ORAL | Status: DC
  Administered 2022-04-29: 15 mg via ORAL
  Filled 2022-04-29: qty 1

## 2022-04-29 MED ORDER — OXYCODONE HCL 5 MG PO TABS
5.0000 mg | ORAL_TABLET | Freq: Once | ORAL | Status: AC
  Administered 2022-04-29: 5 mg via ORAL
  Filled 2022-04-29: qty 1

## 2022-04-29 NOTE — Progress Notes (Signed)
   This pt was referred to hospice services for hospice care at home. I have spoken to the pt herself and friend at bedside Cyndi Lennert. We discuss hospice care at home and the interdisciplinary team approach. The pt ws told she would likely need to have 24 care at home. She states that her children are there to assist with her care and also Ron states he is there as much as possible when not working. She has requested a hospital bed and shower bench for equipment. I have ordered these to be delivered. Pt feels she will go home by car and does have in home a cane. I have spoken to my MD and the pt was approved for hospice services at home.   Webb Silversmith RN

## 2022-04-29 NOTE — Progress Notes (Signed)
Inpatient Rehab Admissions Coordinator:   Received consult for inpatient rehab. Per notes, patient is planning to discharge home with hospice. Signing off at this time.   Rehab Admissons Coordinator [place], [place], [place] [phone]

## 2022-04-29 NOTE — Progress Notes (Signed)
Chaplain was able to facilitate and complete notarization of Healthcare POA documents. Chaplain provided three copies to [place], which are at bedside with her belongings. Chaplain input one into her medical chart and scanned another over to ACP documents. Chaplain provided support, education, and organization of documentation.   Chaplain Amarisa Wilinski, MDiv  04/29/22 1500  Spiritual Encounters  Type of Visit Follow up  Reason for visit Advance directives

## 2022-04-29 NOTE — Consult Note (Signed)
[facility] Consult Note  Patient Care Team: Patient, No Pcp Per as PCP - General (General Practice)  CHIEF COMPLAINTS/PURPOSE OF CONSULTATION:  Anaplastic Astrocytoma  HISTORY OF PRESENTING ILLNESS:  Paula Farmer 42 y.o. female presented with several days of progressive left sided weakness, worse from her baseline impairment.  She lost the ability to walk on her own.  Continues to have headaches, some "body pains" as prior.  No seizures reported.    MEDICAL HISTORY:  Past Medical History:  Diagnosis Date   Cancer ([place])    Seizures ([place])     SURGICAL HISTORY: History reviewed. No pertinent surgical history.  SOCIAL HISTORY: Social History   Socioeconomic History   Marital status: Single    Spouse name: Not on file   Number of children: Not on file   Years of education: Not on file   Highest education level: Not on file  Occupational History   Not on file  Tobacco Use   Smoking status: Some Days    Packs/day: 0.25    Years: 10.00    Additional pack years: 0.00    Total pack years: 2.50    Types: Cigarettes   Smokeless tobacco: Never  Vaping Use   Vaping Use: Never used  Substance and Sexual Activity   Alcohol use: Not Currently    Alcohol/week: 2.0 standard drinks of alcohol    Types: 2 Glasses of wine per week    Comment: drinks wine twice a month   Drug use: Not Currently   Sexual activity: Not Currently  Other Topics Concern   Not on file  Social History Narrative   Not on file   Social Determinants of Health   Financial Resource Strain: Not on file  Food Insecurity: Not on file  Transportation Needs: Not on file  Physical Activity: Not on file  Stress: Not on file  Social Connections: Not on file  Intimate Partner Violence: Not on file    FAMILY HISTORY: Family History  Problem Relation Age of Onset   Cancer Mother     ALLERGIES:  has No Known Allergies.  MEDICATIONS:  Current Facility-Administered  Medications  Medication Dose Route Frequency Provider Last Rate Last Admin   acetaminophen (TYLENOL) tablet 650 mg  650 mg Oral Q6H PRN Lenore Cordia, MD   650 mg at 04/27/22 1024   Or   acetaminophen (TYLENOL) suppository 650 mg  650 mg Rectal Q6H PRN Lenore Cordia, MD       dexamethasone (DECADRON) injection 4 mg  4 mg Intravenous Q6H Gareth Morgan, MD   4 mg at 04/29/22 1222   enoxaparin (LOVENOX) injection 40 mg  40 mg Subcutaneous Q24H Zada Finders R, MD   40 mg at 04/28/22 2155   levETIRAcetam (KEPPRA) tablet 1,000 mg  1,000 mg Oral BID Zada Finders R, MD   1,000 mg at 04/29/22 0930   methocarbamol (ROBAXIN) tablet 500 mg  500 mg Oral TID PRN Lenore Cordia, MD       morphine (MS CONTIN) 12 hr tablet 15 mg  15 mg Oral QHS Mims, Lauren W, DO       morphine (PF) 2 MG/ML injection 2 mg  2 mg Intravenous Q2H PRN Mims, Lauren W, DO       ondansetron (ZOFRAN) tablet 4 mg  4 mg Oral Q6H PRN Lenore Cordia, MD       Or   ondansetron (ZOFRAN) injection 4 mg  4 mg Intravenous Q6H PRN  Lenore Cordia, MD       oxyCODONE (Oxy IR/ROXICODONE) immediate release tablet 15 mg  15 mg Oral Q4H PRN Mims, Lauren W, DO       oxyCODONE (Oxy IR/ROXICODONE) immediate release tablet 5 mg  5 mg Oral Once Mims, Lauren W, DO       polyethylene glycol (MIRALAX / GLYCOLAX) packet 17 g  17 g Oral Daily Mims, Lauren W, DO   17 g at 04/28/22 1433   senna (SENOKOT) tablet 8.6 mg  1 tablet Oral Daily Mims, Lauren W, DO   8.6 mg at 04/28/22 1433   sodium chloride flush (NS) 0.9 % injection 3 mL  3 mL Intravenous Q12H Zada Finders R, MD   3 mL at 04/29/22 0930    REVIEW OF SYSTEMS:   Constitutional: Denies fevers, chills or abnormal weight loss Eyes: Denies blurriness of vision Ears, nose, mouth, throat, and face: Denies mucositis or sore throat Respiratory: Denies cough, dyspnea or wheezes Cardiovascular: Denies palpitation, chest discomfort or lower extremity swelling Gastrointestinal:  Denies nausea,  constipation, diarrhea GU: Denies dysuria or incontinence Skin: Denies abnormal skin rashes Neurological: Per HPI Musculoskeletal: non specific pain Behavioral/Psych: Denies anxiety, disturbance in thought content, and mood instability   PHYSICAL EXAMINATION: Vitals:   04/29/22 0513 04/29/22 1305  BP: (!) 140/82 (!) 150/86  Pulse: 72 (!) 58  Resp: 16 18  Temp: 98 F (36.7 C) 98.4 F (36.9 C)  SpO2: 97% 100%   KPS: 60. General: Alert, cooperative, pleasant, in no acute distress Head: Normal EENT: No conjunctival injection or scleral icterus. Oral mucosa moist Lungs: Resp effort normal Cardiac: Regular rate and rhythm Abdomen: Soft, non-distended abdomen Skin: No rashes cyanosis or petechiae. Extremities: No clubbing or edema  NEUROLOGIC EXAM: Mental Status: Awake, alert, attentive to examiner. Oriented to self and environment. Language is fluent with intact comprehension.  Cranial Nerves: Visual acuity is grossly normal. Visual fields are full. Extra-ocular movements intact. No ptosis. Face is symmetric, tongue midline. Motor: Tone and bulk are normal. Power 3/5 in left arm and leg. Reflexes are symmetric, no pathologic reflexes present. Intact finger to nose bilaterally Sensory: Intact to light touch and temperature Gait: Deferred   LABORATORY DATA:  I have reviewed the data as listed Lab Results  Component Value Date   WBC 3.1 (L) 04/27/2022   HGB 13.9 04/27/2022   HCT 42.2 04/27/2022   MCV 94.4 04/27/2022   PLT 189 04/27/2022   Recent Labs    02/21/22 1426 03/28/22 1205 04/26/22 1440 04/26/22 1448 04/27/22 0416  NA 140 140 139 142 138  K 3.5 3.1* 3.0* 3.1* 3.7  CL 103 103 103 101 103  CO2 33* 31 27  --  25  GLUCOSE 81 86 100* 96 129*  BUN <5* <5* 6 4* 7  CREATININE 0.85 0.62 0.65 0.70 0.58  CALCIUM 9.1 8.7* 9.0  --  9.3  GFRNONAA >60 >60 >60  --  >60  PROT 6.6 6.3* 7.3  --   --   ALBUMIN 3.8 4.2 4.3  --   --   AST 20 16 21   --   --   ALT 12 10 15    --   --   ALKPHOS 46 47 46  --   --   BILITOT 0.4 0.4 0.6  --   --     RADIOGRAPHIC STUDIES: I have personally reviewed the radiological images as listed and agreed with the findings in the report. MR BRAIN W WO  CONTRAST  Result Date: 04/27/2022 CLINICAL DATA:  Neuro deficit, acute, stroke suspected. Left lower extremity weakness. History of astrocytoma. EXAM: MRI HEAD WITHOUT AND WITH CONTRAST TECHNIQUE: Multiplanar, multiecho pulse sequences of the brain and surrounding structures were obtained without and with intravenous contrast. CONTRAST:  7.39mL GADAVIST GADOBUTROL 1 MMOL/ML IV SOLN COMPARISON:  Head CT yesterday.  MRI 12/20/2021.  MRI 03/15/2021. FINDINGS: Brain: I do not see a finding to suggest acute ischemic stroke. There is continued disease progression of the infiltrative mass lesion the epicenter in the right insular region. Increased regional mass effect. Increased regions of indistinct contrast enhancement. Multiple internal cystic changes. Tumor as shows extension into the right cerebral peduncle, midbrain, pons and probably with early involvement of the right cerebellum. Tumor involvement of the hypothalamic region is progressive. Overall degree of mass effect is slightly further increased, with right-to-left midline shift of 9 mm, increased from 8 mm in November. No hydrocephalus. No extra-axial fluid collection. Vascular: Major vessels at the base of the brain show flow. Skull and upper cervical spine: Negative Sinuses/Orbits: Clear/normal Other: None IMPRESSION: 1. No evidence of acute ischemic stroke. 2. Continued disease progression of the patient's known astrocytoma, the epicenter in the right insular region. Increased regional mass effect with right-to-left midline shift of 9 mm, increased from 8 mm in November. Progressive involvement including the hypothalamus, midbrain, brainstem and early involvement of the right cerebellum. Electronically Signed   By: Nelson Chimes M.D.   On:  04/27/2022 14:45   CT Head Wo Contrast  Result Date: 04/26/2022 CLINICAL DATA:  Neuro deficit, acute, stroke suspected EXAM: CT HEAD WITHOUT CONTRAST TECHNIQUE: Contiguous axial images were obtained from the base of the skull through the vertex without intravenous contrast. RADIATION DOSE REDUCTION: This exam was performed according to the departmental dose-optimization program which includes automated exposure control, adjustment of the mA and/or kV according to patient size and/or use of iterative reconstruction technique. COMPARISON:  02/12/2022 FINDINGS: Brain: Again seen is the right frontotemporal mass, grossly unchanged since prior study. 10 mm of right to left midline shift compared to 8 mm previously. No hydrocephalus or hemorrhage. No acute infarction. Vascular: No hyperdense vessel or unexpected calcification. Skull: No acute calvarial abnormality. Sinuses/Orbits: No acute findings Other: None IMPRESSION: Large right frontotemporal mass appears similar to prior study. 10 mm of right to left midline shift. Electronically Signed   By: Rolm Baptise M.D.   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  There is tumor now tracking down into the right midbrain and peduncle.  Motor dysfunction is secondary to this tumor infiltration, not withdrawal/washout of avastin.  We reviewed goals of care again at bedside.  She has now progressed through 4 lines of salvage therapy, including repeat radiation, IDH-1 inhibitor.  Medical options would consist of more toxic and less likely effective IV chemotherapy.  Treatment would not improve her function, only hope to control further spread of tumor temporarily.    We discussed her pain and discomfort, fatigue with years of medical treatments.  She displays interest in comfort care and hospice.    We will touch base with palliative care team  to further goals of care discussion at this time.     All questions were answered. The patient knows to call the clinic with any problems, questions or concerns.  The total time spent in the encounter was 60 minutes and  more than 50% was on counseling and review of test results     Ventura Sellers, MD 04/29/2022 2:02 PM

## 2022-04-29 NOTE — Plan of Care (Signed)
  Problem: Education: Goal: Knowledge of General Education information will improve Description: Including pain rating scale, medication(s)/side effects and non-pharmacologic comfort measures 04/29/2022 1833 by Jerene Pitch, RN Outcome: Not Progressing 04/29/2022 1833 by Jerene Pitch, RN Outcome: Progressing   Problem: Health Behavior/Discharge Planning: Goal: Ability to manage health-related needs will improve 04/29/2022 1833 by Jerene Pitch, RN Outcome: Not Progressing 04/29/2022 1833 by Jerene Pitch, RN Outcome: Progressing

## 2022-04-29 NOTE — Progress Notes (Signed)
Physical Therapy Treatment Patient Details Name: Paula Farmer MRN: [id number] DOB: [birth year] Today's Date: 04/29/2022   History of Present Illness 42 y.o. female with medical history significant for large right frontal anaplastic astrocytoma, tumor related focal seizures, anxiety, medical nonadherence who is admitted 04/26/22 with acute on chronic left hemiparesis.    PT Comments    Pt assisted with ambulating in room however only tolerating room distance at this time.  Pt continues to require cues for safety and assist for stabilizing.  Pt aware she should have person assist with mobilizing at home (using gait belt which was left in room for pt to take home) for safety.  Per chart, plan is for return home with hospice.  Pt would like PT to continue assisting her with mobility in hospital until she discharges.    Recommendations for follow up therapy are one component of a multi-disciplinary discharge planning process, led by the attending physician.  Recommendations may be updated based on patient status, additional functional criteria and insurance authorization.  Follow Up Recommendations       Assistance Recommended at Discharge Intermittent Supervision/Assistance  Patient can return home with the following A little help with walking and/or transfers;A little help with bathing/dressing/bathroom;Assistance with cooking/housework;Direct supervision/assist for financial management;Assist for transportation;Direct supervision/assist for medications management;Help with stairs or ramp for entrance   Equipment Recommendations  None recommended by PT    Recommendations for Other Services       Precautions / Restrictions Precautions Precautions: Fall Precaution Comments: left hemiparesis, left visual neglect     Mobility  Bed Mobility Overal bed mobility: Needs Assistance Bed Mobility: Sidelying to Sit, Sit to Sidelying   Sidelying to sit: Supervision     Sit to sidelying:  Supervision General bed mobility comments: pt self assisting left extremities    Transfers Overall transfer level: Needs assistance Equipment used: Straight cane Transfers: Sit to/from Stand Sit to Stand: Min assist           General transfer comment: assist for stabilizing, cues for safe technique    Ambulation/Gait Ambulation/Gait assistance: Mod assist Gait Distance (Feet): 10 Feet Assistive device: Straight cane Gait Pattern/deviations: Step-to pattern, Narrow base of support, Decreased weight shift to left, Knee hyperextension - left, Decreased dorsiflexion - left       General Gait Details: provided support under left forearm, requiring mod assist for stabilizing, observed more left knee hyperextension at times, also dragging Lt LE behind and decreased DF, cues for sequencing and safety; pt only able to ambulate to doorway of room and then requested recliner due to fatigue   Stairs             Wheelchair Mobility    Modified Rankin (Stroke Patients Only)       Balance                                            Cognition Arousal/Alertness: Awake/alert Behavior During Therapy: WFL for tasks assessed/performed Overall Cognitive Status: No family/caregiver present to determine baseline cognitive functioning Area of Impairment: Safety/judgement                         Safety/Judgement: Decreased awareness of deficits, Decreased awareness of safety     General Comments: tangential, poor short term memory, poor safety awareness        Exercises  General Comments        Pertinent Vitals/Pain Pain Assessment Pain Assessment: Faces Faces Pain Scale: Hurts little more Pain Location: neck Pain Descriptors / Indicators: Sore Pain Intervention(s): Repositioned, Premedicated before session, Monitored during session    Home Living                          Prior Function            PT Goals (current  goals can now be found in the care plan section) Progress towards PT goals: Progressing toward goals    Frequency    Min 3X/week      PT Plan Discharge plan needs to be updated    Co-evaluation              AM-PAC PT "6 Clicks" Mobility   Outcome Measure  Help needed turning from your back to your side while in a flat bed without using bedrails?: A Little Help needed moving from lying on your back to sitting on the side of a flat bed without using bedrails?: A Little Help needed moving to and from a bed to a chair (including a wheelchair)?: A Lot Help needed standing up from a chair using your arms (e.g., wheelchair or bedside chair)?: A Lot Help needed to walk in hospital room?: A Lot Help needed climbing 3-5 steps with a railing? : Total 6 Click Score: 13    End of Session Equipment Utilized During Treatment: Gait belt Activity Tolerance: Patient tolerated treatment well Patient left: with call bell/phone within reach;in bed Nurse Communication: Mobility status PT Visit Diagnosis: Difficulty in walking, not elsewhere classified (R26.2);Hemiplegia and hemiparesis Hemiplegia - Right/Left: Left Hemiplegia - caused by:  (large right frontal anaplastic astrocytoma)     Time: [id number] PT Time Calculation (min) (ACUTE ONLY): 20 min  Charges:  $Gait Training: 8-22 mins           Arlyce Dice, DPT Physical Therapist Acute Rehabilitation Services Office: [place] 04/29/2022, 4:17 PM

## 2022-04-29 NOTE — TOC Progression Note (Signed)
Transition of Care [facility]) - Progression Note    Patient Details  Name: Paula Farmer MRN: [id number] Date of Birth: 20-Apr-1980  Transition of Care [facility]) CM/SW [place], LCSW Phone Number: 04/29/2022, 4:02 PM  Clinical Narrative:    Per chart review pt is being recommended for home hospice. H[facility] for the [facility] following.  Will follow for d/c needs.     Expected Discharge Plan: Home/Self Care Barriers to Discharge: No Barriers Identified  Expected Discharge Plan and Services       Living arrangements for the past 2 months: Single Family Home                                       Social Determinants of Health (SDOH) Interventions SDOH Screenings   Tobacco Use: High Risk (04/26/2022)    Readmission Risk Interventions    04/28/2022    2:48 PM  Readmission Risk Prevention Plan  Post Dischage Appt Complete  Medication Screening Complete  Transportation Screening Complete
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
## 2022-04-29 NOTE — Progress Notes (Signed)
Chaplain engaged in an initial visit with [place]. She was in pain when I arrived. Chaplain let nurse know. Chaplain also tried to check in with her regarding Advanced Directives, Healthcare POA. She was not in a place physically to discuss them at the moment but Chaplain will follow-up later today. Chaplain stressed the importance of her completing the documents because she desires to appoint one daughter and her sister.   Chaplain provided support, education, and a compassionate presence.  Bea Graff, MDiv  04/29/22 1200  Spiritual Encounters  Type of Visit Initial  Care provided to: Patient  Referral source Physician  Reason for visit Advance directives

## 2022-04-29 NOTE — Progress Notes (Signed)
Daily Progress Note   Patient Name: Paula Farmer       Date: 04/29/2022 DOB: 07-07-1980  Age: 42 y.o. MRN#: [id number] Attending Physician: [place] Eric J, DO Primary Care Physician: Patient, No Pcp Per Admit Date: 04/26/2022 Length of Stay: 2 days  Reason for Consultation/Follow-up: Establishing goals of care and symptom management  Subjective:   CC: Patient seen sitting up on side of bed noting worsening pain in left arm.  Following up regarding complex medical decision making and symptom management.  Subjective:  At time of EMR review, patient had received oxycodone 10 mg x 2 doses.  Discussed care with hospitalist and neuro oncologist prior to seeing patient.  Also discussed care with chaplain regarding completion of ACP documentation.  Presented to bedside to see patient.  Reintroduced myself as a member of the palliative medicine team.  Patient seen sitting up on the side of the bed eating lunch.  Patient noting worsening pain in her left hand.  Patient has minimal feeling there so noted she had fingernails cutting into her hand.  Appropriately placed towel in hand to prevent nails from causing further pressure injury.  Also discussed patient had recently received administration of oxycodone 10 mg.  Patient does feel the oxycodone 10 mg helps though has not relieved the pain to a tolerable level.  Discussed we will increase overall oxycodone dose to 15 mg which patient is agreeing to.  Patient noting that she wakes up in pain that can be unbearable.  Discussed addition of MS Contin long-acting tonight to see how patient tolerates that and then would consider adding daytime dose to minimize breakthrough short acting medications needed.  Patient agreeing with this plan.  Patient able to easily discussed that she had talked to Dr. Mickeal Skinner this morning.  She has heard that there are no further appropriate interventions for her cancer.  She also her that she needs to work on getting home  with hospice care to support symptom management at the end of life.  Patient is agreeing with proceeding with hospice referral at this time.  Patient would like to proceed with referral to hospice of the [place].  Noted would assist with coordination of this.  Spent time providing emotional support via active listening. Also discussed importance of completing ACP documentation with chaplain today.  Patient agreeing with this plan.  All questions answered at that time.  Noted palliative medicine team will continue to follow along with patient's medical journey.  Review of Systems  pain Objective:   Vital Signs:  BP (!) 140/82 (BP Location: Right Arm)   Pulse 72   Temp 98 F (36.7 C) (Oral)   Resp 16   Ht 5\' 6"  (1.676 m)   Wt 73.5 kg   LMP 04/15/2022 (Exact Date)   SpO2 97%   BMI 26.15 kg/m   Physical Exam: General: NAD, alert, sitting on side of bed, pleasant  Eyes: conjunctiva clear, anicteric sclera HENT: normocephalic, atraumatic, moist mucous membranes Cardiovascular: RRR, no edema in LE b/l Respiratory: no increased work of breathing noted, not in respiratory distress Abdomen: not distended Extremities: left sided weakness  Skin: no rashes or lesions on visible skin Neuro: A&Ox4, following commands easily Psych: appropriately answers all questions  Imaging:  I personally reviewed recent imaging.   Assessment & Plan:   Assessment: Patient is a 42 year old female with a past medical history of right frontal anaplastic astrocytoma with related focal seizures, and anxiety who was admitted on 04/26/2022 for management of left-sided  hemiparesis and hemiplegia. MRI brain obtained showed continued disease progression of patient's known astrocytoma with increased regional mass effect and progressive involvement including the hypothalamus, midbrain, brainstem, and early involvement of the right cerebellum. Has been following with neuro-oncology as an outpatient. Palliative medicine  team consulted to assist with complex medical decision making.   Recommendations/Plan: # Complex medical decision making/goals of care:   -Conversation with patient as described above in HPI.  Patient has heard that there are no longer beneficial cancer directed therapies.  Patient agreeing with referral to home hospice to receive symptom management at the end of her life.                -Again recommended completion of ACP documentation chaplain today which patient is agreeing with.                               -Patient had stated she would want her sister, Paula Farmer to make medical decisions if she was unable. Again discussed importance of this being in writing since she has children over 18. Did place contact info for sister in chart.                 -  Code Status: Full Code    # Symptom management                -Pain, acute on chronic in setting of right frontal anaplastic astrocytoma                               -Discontinued tramadol. Please do not restart in setting of underlying seizures from astrocytoma.                                -Increase oxycodone to 15mg  q4hrs prn which was home medication.  -Start MS Contin 15mg  qhs. Should patient tolerate dose well, would change to q12hr dosing to allow for longer acting pain management.                                 -Continue IV morphine 2mg  q2hrs prn for breakthrough managements.                                -If patient requiring frequent prn dosing, likely needs long acting medication added.                                -Continue IV decadron as per neuro onc.                  -Added bowel regimen to patient remain on while receiving opioids.    # Psycho-social/Spiritual Support:  - Support System: sister, mother, 4 children (63, 55, 5,. 17)   # Discharge Planning:  Home with hospice via [place]   Discussed with: Patient, hospitalist, neuro-oncology, chaplain, bedside RN  Thank you for allowing the  palliative care team to participate in the care [facility].  Chelsea Aus, DO Palliative Care Provider PMT # [phone]  If patient remains symptomatic despite maximum doses, please call PMT at [phone] between 0700 and 1900. Outside  of these hours, please call attending, as PMT does not have night coverage.  *Please note that this is a verbal dictation therefore any spelling or grammatical errors are due to the "[employer]" system interpretation.

## 2022-04-30 ENCOUNTER — Other Ambulatory Visit: Payer: Self-pay

## 2022-04-30 DIAGNOSIS — C719 Malignant neoplasm of brain, unspecified: Secondary | ICD-10-CM | POA: Diagnosis not present

## 2022-04-30 DIAGNOSIS — Z5111 Encounter for antineoplastic chemotherapy: Secondary | ICD-10-CM

## 2022-04-30 MED ORDER — ONDANSETRON HCL 4 MG PO TABS: 4.0000 mg | ORAL_TABLET | Freq: Every day | ORAL | 1 refills | Status: DC | PRN

## 2022-04-30 MED ORDER — LEVETIRACETAM 1000 MG PO TABS: 1000.0000 mg | ORAL_TABLET | Freq: Two times a day (BID) | ORAL | 2 refills | Status: DC

## 2022-04-30 MED ORDER — POLYETHYLENE GLYCOL 3350 17 G PO PACK: 17.0000 g | PACK | Freq: Every day | ORAL | 2 refills | Status: AC

## 2022-04-30 MED ORDER — SENNA 8.6 MG PO TABS: 1.0000 | ORAL_TABLET | Freq: Every day | ORAL | 2 refills | Status: DC

## 2022-04-30 MED ORDER — LORAZEPAM 1 MG PO TABS: 1.0000 mg | ORAL_TABLET | Freq: Three times a day (TID) | ORAL | 0 refills | Status: AC | PRN

## 2022-04-30 MED ORDER — DEXAMETHASONE 4 MG PO TABS: 4.0000 mg | ORAL_TABLET | Freq: Four times a day (QID) | ORAL | 1 refills | Status: DC

## 2022-04-30 MED ORDER — OXYCODONE HCL 15 MG PO TABS: 15.0000 mg | ORAL_TABLET | ORAL | 0 refills | Status: AC | PRN

## 2022-04-30 MED ORDER — MORPHINE SULFATE ER 15 MG PO TBCR: 15.0000 mg | EXTENDED_RELEASE_TABLET | Freq: Two times a day (BID) | ORAL | 0 refills | Status: AC
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
## 2022-04-30 NOTE — Plan of Care (Signed)
  Problem: Education: Goal: Knowledge of General Education information will improve Description Including pain rating scale, medication(s)/side effects and non-pharmacologic comfort measures Outcome: Progressing   Problem: Health Behavior/Discharge Planning: Goal: Ability to manage health-related needs will improve Outcome: Progressing   

## 2022-04-30 NOTE — Progress Notes (Signed)
Daily Progress Note   Patient Name: Paula Farmer       Date: 04/30/2022 DOB: 11-20-1980  Age: 42 y.o. MRN#: [id number] Attending Physician: [place] Eric J, DO Primary Care Physician: Patient, No Pcp Per Admit Date: 04/26/2022 Length of Stay: 3 days  Reason for Consultation/Follow-up: Establishing goals of care and symptom management  Subjective:   CC: Patient sitting up in bed, is able to feed herself breakfast.      Following up regarding complex medical decision making and symptom management.  Subjective:  Pain medication reviewed in EMR. Patient states she slept well, she did speak with hospice RN.    All questions answered at that time.  Noted palliative medicine team will continue to follow along with patient's medical journey.  Review of Systems  pain Objective:   Vital Signs:  BP 135/88 (BP Location: Right Arm)   Pulse (!) 50   Temp 97.8 F (36.6 C) (Oral)   Resp 20   Ht 5\' 6"  (1.676 m)   Wt 73.5 kg   LMP 04/15/2022 (Exact Date)   SpO2 100%   BMI 26.15 kg/m   Physical Exam: General: NAD, alert, sitting on side of bed, pleasant  Eyes: conjunctiva clear, anicteric sclera HENT: normocephalic, atraumatic, moist mucous membranes Cardiovascular: RRR, no edema in LE b/l Respiratory: no increased work of breathing noted, not in respiratory distress Abdomen: not distended Extremities: left sided weakness  Skin: no rashes or lesions on visible skin Neuro: A&Ox4, following commands easily Psych: appropriately answers all questions  Imaging:  I personally reviewed recent imaging.   Assessment & Plan:   Assessment: Patient is a 42 year old female with a past medical history of right frontal anaplastic astrocytoma with related focal seizures, and anxiety who was admitted on 04/26/2022 for management of left-sided hemiparesis and hemiplegia. MRI brain obtained showed continued disease progression of patient's known astrocytoma with increased regional mass effect  and progressive involvement including the hypothalamus, midbrain, brainstem, and early involvement of the right cerebellum. Has been following with neuro-oncology as an outpatient. Palliative medicine team consulted to assist with complex medical decision making.   Recommendations/Plan: # Complex medical decision making/goals of care:      referral to home hospice to receive symptom management at the end of her life.                 Patient stated she would want her sister, Nandhini Shwartz to make medical decisions if she was unable. Again discussed importance of this being in writing since she has children over 18. Did place contact info for sister in chart.                 -  Code Status: Full Code    # Symptom management                -Pain, acute on chronic in setting of right frontal anaplastic astrocytoma                               -Discontinued tramadol. Please do not restart in setting of underlying seizures from astrocytoma.                                  oxycodone to 15mg  q4hrs prn which was home medication.  MS Contin 15mg  qhs. Should patient tolerate dose well, would change to q12hr dosing to  allow for longer acting pain management.                                                                 -If patient requiring frequent prn dosing, likely needs long acting medication added.                                -Continue IV decadron as per neuro onc.                  -Added bowel regimen to patient remain on while receiving opioids.    # Psycho-social/Spiritual Support:  - Support System: sister, mother, 4 children (1, 33, 62,. 17)   # Discharge Planning:  Home with hospice via [place]   Discussed with: Patient   Thank you for allowing the palliative care team to participate in the care [facility].  [place] MD Palliative Care Provider PMT # [phone]  If patient remains symptomatic despite maximum doses, please call PMT at  [phone] between 0700 and 1900. Outside of these hours, please call attending, as PMT does not have night coverage.  *Please note that this is a verbal dictation therefore any spelling or grammatical errors are due to the "[employer]" system interpretation.

## 2022-04-30 NOTE — Progress Notes (Signed)
Reviewed dc instructions with pt and caregiver including medications and follow up appointments. Pt and caregiver verbalized understanding. Andrews Tener, Laurel Dimmer, RN

## 2022-05-01 ENCOUNTER — Other Ambulatory Visit (HOSPITAL_COMMUNITY): Payer: Self-pay

## 2022-05-16 ENCOUNTER — Ambulatory Visit (HOSPITAL_COMMUNITY): Payer: Medicaid Other

## 2022-05-29 ENCOUNTER — Encounter (HOSPITAL_COMMUNITY): Payer: Self-pay

## 2022-05-29 ENCOUNTER — Ambulatory Visit (HOSPITAL_COMMUNITY)
Admission: RE | Admit: 2022-05-29 | Discharge: 2022-05-29 | Disposition: A | Discharge status: Home / Self Care | Payer: Medicaid Other | Source: Ambulatory Visit | Attending: Internal Medicine | Admitting: Internal Medicine

## 2022-05-29 DIAGNOSIS — C719 Malignant neoplasm of brain, unspecified: Secondary | ICD-10-CM | POA: Diagnosis not present

## 2022-05-29 MED ORDER — SODIUM CHLORIDE (PF) 0.9 % IJ SOLN
INTRAMUSCULAR | Status: AC
  Filled 2022-05-29: qty 50

## 2022-05-29 MED ORDER — IOHEXOL 300 MG/ML  SOLN
75.0000 mL | Freq: Once | INTRAMUSCULAR | Status: AC | PRN
  Administered 2022-05-29: 75 mL via INTRAVENOUS

## 2022-06-02 ENCOUNTER — Encounter: Payer: Self-pay | Admitting: Internal Medicine

## 2022-06-03 ENCOUNTER — Other Ambulatory Visit: Payer: Self-pay | Admitting: *Deleted

## 2022-06-04 ENCOUNTER — Ambulatory Visit (HOSPITAL_COMMUNITY): Admission: RE | Admit: 2022-06-04 | Payer: Medicaid Other | Source: Home / Self Care

## 2022-06-04 ENCOUNTER — Encounter (HOSPITAL_COMMUNITY): Admission: RE | Payer: Self-pay | Source: Home / Self Care

## 2022-06-04 ENCOUNTER — Ambulatory Visit (HOSPITAL_COMMUNITY): Payer: Medicaid Other

## 2022-06-04 ENCOUNTER — Other Ambulatory Visit: Payer: Self-pay | Admitting: Internal Medicine

## 2022-06-04 ENCOUNTER — Telehealth: Payer: Self-pay | Admitting: *Deleted

## 2022-06-04 DIAGNOSIS — C719 Malignant neoplasm of brain, unspecified: Secondary | ICD-10-CM

## 2022-06-04 SURGERY — MRI WITH ANESTHESIA
Anesthesia: General

## 2022-06-04 NOTE — Telephone Encounter (Signed)
PC to patient, this RN informed by [employer] they received a call from the patient asking about scheduling a brain MRI.  Patient had previously been admitted to hospice at home.  Per patient, she wants to have a MRI & a F/U appointment with Dr Barbaraann Cao.  Explained to patient she will not continue on hospice services if she continues care & imaging, she states hospice has not been to see her "in several weeks" & she wants to F/U here.  Dr Barbaraann Cao informed, MRI order placed.  Informed patient the MRI & a visit with Dr Barbaraann Cao will be scheduled.  She verbalizes understanding.

## 2022-06-05 ENCOUNTER — Telehealth: Payer: Self-pay | Admitting: Internal Medicine

## 2022-06-05 NOTE — Telephone Encounter (Signed)
Scheduled per 05/07 in-basket message, called patient's sister regarding upcoming appointment.

## 2022-06-08 ENCOUNTER — Emergency Department (HOSPITAL_COMMUNITY): Payer: Medicaid Other

## 2022-06-08 ENCOUNTER — Emergency Department (HOSPITAL_COMMUNITY)
Admission: EM | Admit: 2022-06-08 | Discharge: 2022-06-09 | Disposition: A | Discharge status: Home / Self Care | Payer: Medicaid Other | Attending: Emergency Medicine | Admitting: Emergency Medicine

## 2022-06-08 ENCOUNTER — Encounter (HOSPITAL_COMMUNITY): Payer: Self-pay

## 2022-06-08 ENCOUNTER — Other Ambulatory Visit: Payer: Self-pay

## 2022-06-08 DIAGNOSIS — W19XXXA Unspecified fall, initial encounter: Secondary | ICD-10-CM

## 2022-06-08 DIAGNOSIS — W01198A Fall on same level from slipping, tripping and stumbling with subsequent striking against other object, initial encounter: Secondary | ICD-10-CM | POA: Diagnosis not present

## 2022-06-08 DIAGNOSIS — C719 Malignant neoplasm of brain, unspecified: Secondary | ICD-10-CM | POA: Diagnosis not present

## 2022-06-08 DIAGNOSIS — R202 Paresthesia of skin: Secondary | ICD-10-CM | POA: Diagnosis not present

## 2022-06-08 DIAGNOSIS — M542 Cervicalgia: Secondary | ICD-10-CM | POA: Insufficient documentation

## 2022-06-08 DIAGNOSIS — S0990XA Unspecified injury of head, initial encounter: Secondary | ICD-10-CM | POA: Insufficient documentation

## 2022-06-08 DIAGNOSIS — Y92009 Unspecified place in unspecified non-institutional (private) residence as the place of occurrence of the external cause: Secondary | ICD-10-CM | POA: Insufficient documentation

## 2022-06-08 LAB — COMPREHENSIVE METABOLIC PANEL
ALT: 18 U/L (ref 0–44)
AST: 20 U/L (ref 15–41)
Albumin: 3.7 g/dL (ref 3.5–5.0)
Alkaline Phosphatase: 44 U/L (ref 38–126)
Anion gap: 10 (ref 5–15)
BUN: <5 mg/dL — ABNORMAL LOW (ref 6–20)
CO2: 26 mmol/L (ref 22–32)
Calcium: 9.2 mg/dL (ref 8.9–10.3)
Chloride: 96 mmol/L — ABNORMAL LOW (ref 98–111)
Creatinine, Ser: 0.69 mg/dL (ref 0.44–1.00)
GFR, Estimated: >60 mL/min (ref >60)
Glucose, Bld: 116 mg/dL — ABNORMAL HIGH (ref 70–99)
Potassium: 3.3 mmol/L — ABNORMAL LOW (ref 3.5–5.1)
Sodium: 132 mmol/L — ABNORMAL LOW (ref 135–145)
Total Bilirubin: 0.5 mg/dL (ref 0.3–1.2)
Total Protein: 6.6 g/dL (ref 6.5–8.1)

## 2022-06-08 LAB — CBC
HCT: 43.3 % (ref 36.0–46.0)
Hemoglobin: 14 g/dL (ref 12.0–15.0)
MCH: 29.8 pg (ref 26.0–34.0)
MCHC: 32.3 g/dL (ref 30.0–36.0)
MCV: 92.1 fL (ref 80.0–100.0)
Platelets: 203 10*3/uL (ref 150–400)
RBC: 4.7 MIL/uL (ref 3.87–5.11)
RDW: 13.8 % (ref 11.5–15.5)
WBC: 7.5 10*3/uL (ref 4.0–10.5)
nRBC: 0 % (ref 0.0–0.2)

## 2022-06-08 LAB — I-STAT BETA HCG BLOOD, ED (MC, WL, AP ONLY): I-stat hCG, quantitative: 7.2 m[IU]/mL — ABNORMAL HIGH (ref <5)

## 2022-06-08 NOTE — ED Provider Notes (Signed)
[place] EMERGENCY DEPARTMENT AT [facility] Provider Note   CSN: [id number] Arrival date & time: 06/08/22  2104     History {Add pertinent medical, surgical, social history, OB history to HPI:1} Chief Complaint  Patient presents with  . Fall   HPI Paula Farmer is a 42 y.o. female with large right frontal anaplastic astrocytoma, tumor related focal seizures presenting for fall that occurred 4 days ago.  States she was at home and slipped and fell backwards and hit the back of her head.  Now endorsing headache, dizziness and midline cervical tenderness.  States she is also has some numbness in her left arm and left leg but this has been going on for about a month now.  Denies visual disturbance.  Patient is on Lovenox.  Currently undergoing radiation for brain cancer.  She denies loss of consciousness.  Headache and neck pain have progressively worsened in the last 48 hours which prompted her evaluation today.  Dizziness last for minutes, endorses associated room spinning sensation.  Dizziness is worse when going from standing to standing and improves with if she sits down.  Denies loss of balance.  Normally walks with a cane.   Fall      Home Medications Prior to Admission medications   Medication Sig Start Date End Date Taking? Authorizing Provider  amitriptyline (ELAVIL) 50 MG tablet TAKE 1 TABLET BY MOUTH EVERYDAY AT BEDTIME Patient taking differently: Take 50 mg by mouth at bedtime. 03/28/22   Vaslow, Georgeanna Lea, MD  dexamethasone (DECADRON) 4 MG tablet Take 1 tablet (4 mg total) by mouth 4 (four) times daily. 04/30/22 06/29/22  [place], Alvira Philips, DO  HYDROcodone-acetaminophen (NORCO) 10-325 MG tablet Take 1 tablet by mouth every 6 (six) hours as needed for severe pain.    [provider]  ivosidenib (TIBSOVO) 250 MG tablet Take 2 tablets (500 mg total) by mouth daily. Avoid taking with a high-fat meal. 03/28/22   Vaslow, Georgeanna Lea, MD  levETIRAcetam (KEPPRA)  1000 MG tablet Take 1 tablet (1,000 mg total) by mouth 2 (two) times daily. 04/30/22 07/29/22  [place], Eric J, DO  LORazepam (ATIVAN) 1 MG tablet Take 1 tablet (1 mg total) by mouth 3 (three) times daily as needed for anxiety. 04/30/22   [place], Alvira Philips, DO  methocarbamol (ROBAXIN) 500 MG tablet Take 500 mg by mouth 3 (three) times daily as needed for muscle spasms. 04/26/21   [provider]  Multiple Vitamins-Minerals (HM MULTIVITAMIN ADULT GUMMY PO) Take 2 tablets by mouth daily.    [provider]  omeprazole (PRILOSEC) 20 MG capsule Take 20 mg by mouth daily. 04/19/21   [provider]  ondansetron (ZOFRAN) 4 MG tablet Take 1 tablet (4 mg total) by mouth daily as needed for nausea or vomiting. 04/30/22 04/30/23  [place], Alvira Philips, DO  oxyCODONE (ROXICODONE) 15 MG immediate release tablet Take 1 tablet (15 mg total) by mouth every 4 (four) hours as needed for moderate pain, severe pain or breakthrough pain. 04/30/22   [place], Eric J, DO  polyethylene glycol (MIRALAX / GLYCOLAX) 17 g packet Take 17 g by mouth daily. 04/30/22 07/29/22  [place], Alvira Philips, DO  potassium chloride SA (KLOR-CON M) 20 MEQ tablet Take 20 mEq by mouth 2 (two) times daily.    [provider]  senna (SENOKOT) 8.6 MG TABS tablet Take 1 tablet (8.6 mg total) by mouth daily. 04/30/22 07/29/22  [place], Eric J, DO      Allergies  Patient has no known allergies.    Review of Systems   See HPI for    Physical Exam   Vitals:   06/08/22 2112 06/08/22 2122  BP: (!) 164/102   Pulse: 79   Resp: 17   Temp: 98.5 F (36.9 C)   SpO2: 100% 100%    CONSTITUTIONAL:  well-appearing, NAD NEURO: GCS 15. Speech is goal oriented. No deficits appreciated to CN III-XII; symmetric eyebrow raise, no facial drooping, tongue midline.  5/5 grip strength in the right hand, 4/5 strength in left hand.  4/5 strength in the lower left extremity right 5/5 in the left.  Sensation to light touch intact. Patient moves extremities  without ataxia. Normal finger-nose-finger.  Unable to assess gait, patient having difficulty going from sitting to standing due to her left leg weakness. EYES:  eyes equal and reactive ENT/NECK:  Supple, no stridor  CARDIO:  regular rate and rhythm, appears well-perfused  PULM:  No respiratory distress, CTAB GI/GU:  non-distended, soft MSK/SPINE:  No gross deformities, no edema, moves all extremities  SKIN:  no rash, atraumatic  *Additional and/or pertinent findings included in MDM below  ED Results / Procedures / Treatments   Labs (all labs ordered are listed, but only abnormal results are displayed) Labs Reviewed - No data to display  EKG None  Radiology No results found.  Procedures Procedures  {Document cardiac monitor, telemetry assessment procedure when appropriate:1}  Medications Ordered in ED Medications - No data to display  ED Course/ Medical Decision Making/ A&P   {   Click here for ABCD2, HEART and other calculatorsREFRESH Note before signing :1}                          Medical Decision Making  ***  {Document critical care time when appropriate:1} {Document review of labs and clinical decision tools ie heart score, Chads2Vasc2 etc:1}  {Document your independent review of radiology images, and any outside records:1} {Document your discussion with family members, caretakers, and with consultants:1} {Document social determinants of health affecting pt's care:1} {Document your decision making why or why not admission, treatments were needed:1} Final Clinical Impression(s) / ED Diagnoses Final diagnoses:  None    Rx / DC Orders ED Discharge Orders     None

## 2022-06-08 NOTE — ED Triage Notes (Signed)
Patient arrives via ems  from home secondary to fall that occurred 4 days ago, patient hit the head. Patient c/o headache that Progressively getting worst and feeling dizzy. Currently under going radiation for brain cancer. Patient on levonox daily. No loc.

## 2022-06-09 MED ORDER — POTASSIUM CHLORIDE CRYS ER 20 MEQ PO TBCR
40.0000 meq | EXTENDED_RELEASE_TABLET | Freq: Once | ORAL | Status: AC
  Administered 2022-06-09: 40 meq via ORAL
  Filled 2022-06-09: qty 2

## 2022-06-09 MED ORDER — ACETAMINOPHEN 325 MG PO TABS
650.0000 mg | ORAL_TABLET | ORAL | Status: AC
  Administered 2022-06-09: 650 mg via ORAL
  Filled 2022-06-09: qty 2

## 2022-06-09 MED ORDER — OXYCODONE-ACETAMINOPHEN 5-325 MG PO TABS
1.0000 | ORAL_TABLET | Freq: Once | ORAL | Status: AC
  Administered 2022-06-09: 1 via ORAL
  Filled 2022-06-09: qty 1

## 2022-06-09 NOTE — Discharge Instructions (Signed)
You had a fall yesterday however there were no fractures on your imaging studies.  I recommend Tylenol 1000 mg every 6 hours as needed for pain.  Follow-up with your primary care provider and oncologist.

## 2022-06-09 NOTE — ED Notes (Signed)
Called [facility] to transport patient to home address.  

## 2022-06-09 NOTE — ED Provider Notes (Signed)
Patient care accepted from prior provider  "Paula Farmer is a 42 y.o. female with large right frontal anaplastic astrocytoma, tumor related focal seizures presenting for fall that occurred 4 days ago.  States she was at home and slipped and fell backwards and hit the back of her head.  Now endorsing headache, dizziness and midline cervical tenderness.  States she is also has some numbness in her left arm and left leg but this has been going on for about a month now.  Denies visual disturbance.  Patient is on Lovenox.  Currently undergoing radiation for brain cancer.  She denies loss of consciousness.  Headache and neck pain have progressively worsened in the last 48 hours which prompted her evaluation today.  Dizziness last for minutes, endorses associated room spinning sensation.  Dizziness is worse when going from standing to standing and improves with if she sits down.  Denies loss of balance.  Normally walks with a cane."  Physical Exam  BP (!) 153/96 (BP Location: Left Arm)   Pulse 74   Temp 98.3 F (36.8 C) (Oral)   Resp 18   Ht 5\' 6"  (1.676 m)   Wt 73.6 kg   SpO2 100%   BMI 26.19 kg/m   Physical Exam  Procedures  Procedures  ED Course / MDM    Medical Decision Making Amount and/or Complexity of Data Reviewed Labs: ordered. Radiology: ordered.  Risk OTC drugs. Prescription drug management.    CT head unchanged from prior with chronic cancerous masses.  CT C-spine and lumbar spine without fracture, labs overall reassuring   Will discharge home.  Patient is ambulating without difficulty at her baseline (w walker) all questions answered best my ability.   Solon Augusta [place], [place] 06/09/22 0321    Gilda Crease, MD 06/10/22 [phone]

## 2022-06-15 ENCOUNTER — Encounter (HOSPITAL_COMMUNITY): Payer: Self-pay | Admitting: *Deleted

## 2022-06-15 ENCOUNTER — Other Ambulatory Visit: Payer: Self-pay

## 2022-06-15 ENCOUNTER — Emergency Department (HOSPITAL_COMMUNITY): Payer: Medicaid Other

## 2022-06-15 ENCOUNTER — Emergency Department (HOSPITAL_COMMUNITY)
Admission: EM | Admit: 2022-06-15 | Discharge: 2022-06-15 | Disposition: A | Discharge status: Home / Self Care | Payer: Medicaid Other | Attending: Emergency Medicine | Admitting: Emergency Medicine

## 2022-06-15 DIAGNOSIS — R519 Headache, unspecified: Secondary | ICD-10-CM | POA: Diagnosis present

## 2022-06-15 DIAGNOSIS — C719 Malignant neoplasm of brain, unspecified: Secondary | ICD-10-CM

## 2022-06-15 LAB — CBC WITH DIFFERENTIAL/PLATELET
Abs Immature Granulocytes: 0 10*3/uL (ref 0.00–0.07)
Basophils Absolute: 0 10*3/uL (ref 0.0–0.1)
Basophils Relative: 0 %
Eosinophils Absolute: 0 10*3/uL (ref 0.0–0.5)
Eosinophils Relative: 0 %
HCT: 45.3 % (ref 36.0–46.0)
Hemoglobin: 14.6 g/dL (ref 12.0–15.0)
Immature Granulocytes: 0 %
Lymphocytes Relative: 12 %
Lymphs Abs: 0.7 10*3/uL (ref 0.7–4.0)
MCH: 30.2 pg (ref 26.0–34.0)
MCHC: 32.2 g/dL (ref 30.0–36.0)
MCV: 93.6 fL (ref 80.0–100.0)
Monocytes Absolute: 0.2 10*3/uL (ref 0.1–1.0)
Monocytes Relative: 4 %
Neutro Abs: 5.3 10*3/uL (ref 1.7–7.7)
Neutrophils Relative %: 84 %
Platelets: 193 10*3/uL (ref 150–400)
RBC: 4.84 MIL/uL (ref 3.87–5.11)
RDW: 14.1 % (ref 11.5–15.5)
WBC: 6.3 10*3/uL (ref 4.0–10.5)
nRBC: 0 % (ref 0.0–0.2)

## 2022-06-15 LAB — COMPREHENSIVE METABOLIC PANEL
ALT: 18 U/L (ref 0–44)
AST: 16 U/L (ref 15–41)
Albumin: 4.2 g/dL (ref 3.5–5.0)
Alkaline Phosphatase: 43 U/L (ref 38–126)
Anion gap: 10 (ref 5–15)
BUN: 5 mg/dL — ABNORMAL LOW (ref 6–20)
CO2: 24 mmol/L (ref 22–32)
Calcium: 9 mg/dL (ref 8.9–10.3)
Chloride: 101 mmol/L (ref 98–111)
Creatinine, Ser: 0.63 mg/dL (ref 0.44–1.00)
GFR, Estimated: >60 mL/min (ref >60)
Glucose, Bld: 110 mg/dL — ABNORMAL HIGH (ref 70–99)
Potassium: 3.5 mmol/L (ref 3.5–5.1)
Sodium: 135 mmol/L (ref 135–145)
Total Bilirubin: 0.5 mg/dL (ref 0.3–1.2)
Total Protein: 7.3 g/dL (ref 6.5–8.1)

## 2022-06-15 MED ORDER — DEXAMETHASONE SODIUM PHOSPHATE 4 MG/ML IJ SOLN
4.0000 mg | Freq: Once | INTRAMUSCULAR | Status: AC
  Administered 2022-06-15: 4 mg via INTRAVENOUS
  Filled 2022-06-15: qty 1

## 2022-06-15 MED ORDER — DIPHENHYDRAMINE HCL 50 MG/ML IJ SOLN
25.0000 mg | Freq: Once | INTRAMUSCULAR | Status: AC
  Administered 2022-06-15: 25 mg via INTRAVENOUS
  Filled 2022-06-15: qty 1

## 2022-06-15 MED ORDER — PROCHLORPERAZINE EDISYLATE 10 MG/2ML IJ SOLN
5.0000 mg | Freq: Once | INTRAMUSCULAR | Status: AC
  Administered 2022-06-15: 5 mg via INTRAVENOUS
  Filled 2022-06-15: qty 2

## 2022-06-15 MED ORDER — HYDROCODONE-ACETAMINOPHEN 5-325 MG PO TABS: 1.0000 | ORAL_TABLET | Freq: Once | ORAL | Status: DC

## 2022-06-15 MED ORDER — HYDROMORPHONE HCL 1 MG/ML IJ SOLN
0.5000 mg | Freq: Once | INTRAMUSCULAR | Status: AC
  Administered 2022-06-15: 0.5 mg via INTRAVENOUS
  Filled 2022-06-15: qty 1

## 2022-06-15 MED ORDER — HYDROCODONE-ACETAMINOPHEN 10-325 MG PO TABS: 1.0000 | ORAL_TABLET | Freq: Four times a day (QID) | ORAL | 0 refills | Status: DC | PRN

## 2022-06-15 MED ORDER — KETOROLAC TROMETHAMINE 15 MG/ML IJ SOLN
15.0000 mg | Freq: Once | INTRAMUSCULAR | Status: AC
  Administered 2022-06-15: 15 mg via INTRAVENOUS
  Filled 2022-06-15: qty 1

## 2022-06-15 NOTE — ED Provider Notes (Signed)
Patient was a handoff from [place], [place].  Plan time of handoff was to reassess patient after dose of Dilaudid administered to determine if symptomatic relief was achieved for headache. Physical Exam  BP 123/82   Pulse 75   Temp 98.3 F (36.8 C)   Resp 18   Ht 5\' 6"  (1.676 m)   Wt 73.6 kg   SpO2 97%   BMI 26.19 kg/m   Physical Exam  Procedures  Procedures  ED Course / MDM   Clinical Course as of 06/15/22 2054  Sat Jun 15, 2022  1810 Stable headache HX of large IC mass Headache today worse than prior Working up for bleeding/mass change [CC]  2037 CT scan with no focal pathology.  PA consulted neurology who follows the patient in the outpatient setting.  They were aware the patient was coming in for symptomatic management of headaches.  They have stated the patient has been referred to hospice and there is likely no reversible pathology.  She has had chronic headaches as part of her disease process.  They have recommended symptomatic management with plan to follow-up with them on Monday. [CC]  2039 .  Patient is resting comfortably states her symptoms have been appropriately mitigated.  I personally reassessed at bedside let patient follow-up with primary team in the outpatient setting on Monday.  States that she does not have anything to take at home for pain at this time.  Short course of as needed pain control reasonable strict return precautions regarding interval worsening. [CC]    Clinical Course User Index [CC] Glyn Ade, MD   Medical Decision Making Amount and/or Complexity of Data Reviewed Labs: ordered. Radiology: ordered.  Risk Prescription drug management.   Patient was a handoff from [employer], PA-C.  Plan at time of handoff was to reassess patient after dose of Dilaudid administered.  Reassessment of patient reveals that patient no longer is experiencing any pain in her head.  Advised patient that a prescription for Norco has been refilled and she may take  it to her pharmacy of her choosing.  Encourage patient to follow-up with her specialist team for further evaluation.  Otherwise encouraged patient to return to the emergency department if symptoms are worsening.  Patient agreeable to treatment plan and discharged home.  All questions answered prior to patient discharge.       Smitty Knudsen, PA-C 06/15/22 2055    Vanetta Mulders, MD 06/16/22 [phone]

## 2022-06-15 NOTE — ED Notes (Signed)
Pt taken to toilet w/WC.

## 2022-06-15 NOTE — Discharge Instructions (Addendum)
You were seen in the emergency department for headache.  It appears that your brain tumor is unchanged based on CT imaging.  We spoke with Dr. Barbaraann Cao who advised that he follow-up with their office.  I have a prescription for Norco has been renewed for you.  You may pick this up and take the medication as needed.  Please follow the directions of your oncology team and other specialists for treatment.  If you feel your symptoms are acutely worsening, please return the emergency department.

## 2022-06-15 NOTE — ED Provider Notes (Signed)
[place] EMERGENCY DEPARTMENT AT [facility] : [facility] Provider Note   CSN: [id number] Arrival date & time: 06/15/22  1515     History  Chief Complaint  Patient presents with   Headache    Paula Farmer is a 42 y.o. female with PMHx s/f large right frontal anaplastic astrocytoma who presents to ED complaining of headache x3 days. Headache is located posteriorly and on right side of neck. Patient is currently endorsing compliance on seizure medication and steroids.  Patient denies falls, head trauma, nausea, vomiting, paresthesias, recent seizures, blood thinners. Denies chest pain, shortness of breath, abdominal pain.   Headache      Home Medications Prior to Admission medications   Medication Sig Start Date End Date Taking? Authorizing Provider  amitriptyline (ELAVIL) 50 MG tablet TAKE 1 TABLET BY MOUTH EVERYDAY AT BEDTIME Patient taking differently: Take 50 mg by mouth at bedtime. 03/28/22   Vaslow, Georgeanna Lea, MD  dexamethasone (DECADRON) 4 MG tablet Take 1 tablet (4 mg total) by mouth 4 (four) times daily. 04/30/22 06/29/22  [place], Alvira Philips, DO  HYDROcodone-acetaminophen (NORCO) 10-325 MG tablet Take 1 tablet by mouth every 6 (six) hours as needed for severe pain.    [provider]  ivosidenib (TIBSOVO) 250 MG tablet Take 2 tablets (500 mg total) by mouth daily. Avoid taking with a high-fat meal. 03/28/22   Vaslow, Georgeanna Lea, MD  levETIRAcetam (KEPPRA) 1000 MG tablet Take 1 tablet (1,000 mg total) by mouth 2 (two) times daily. 04/30/22 07/29/22  [place], Eric J, DO  LORazepam (ATIVAN) 1 MG tablet Take 1 tablet (1 mg total) by mouth 3 (three) times daily as needed for anxiety. 04/30/22   [place], Alvira Philips, DO  methocarbamol (ROBAXIN) 500 MG tablet Take 500 mg by mouth 3 (three) times daily as needed for muscle spasms. 04/26/21   [provider]  Multiple Vitamins-Minerals (HM MULTIVITAMIN ADULT GUMMY PO) Take 2 tablets by mouth daily.    [provider]   omeprazole (PRILOSEC) 20 MG capsule Take 20 mg by mouth daily. 04/19/21   [provider]  ondansetron (ZOFRAN) 4 MG tablet Take 1 tablet (4 mg total) by mouth daily as needed for nausea or vomiting. 04/30/22 04/30/23  [place], Alvira Philips, DO  oxyCODONE (ROXICODONE) 15 MG immediate release tablet Take 1 tablet (15 mg total) by mouth every 4 (four) hours as needed for moderate pain, severe pain or breakthrough pain. 04/30/22   [place], Eric J, DO  polyethylene glycol (MIRALAX / GLYCOLAX) 17 g packet Take 17 g by mouth daily. 04/30/22 07/29/22  [place], Alvira Philips, DO  potassium chloride SA (KLOR-CON M) 20 MEQ tablet Take 20 mEq by mouth 2 (two) times daily.    [provider]  senna (SENOKOT) 8.6 MG TABS tablet Take 1 tablet (8.6 mg total) by mouth daily. 04/30/22 07/29/22  [place], Eric J, DO      Allergies    Patient has no known allergies.    Review of Systems   Review of Systems  Neurological:  Positive for headaches.    Physical Exam Updated Vital Signs BP (!) 158/94   Pulse 85   Temp 98.3 F (36.8 C)   Resp 18   Ht 5\' 6"  (1.676 m)   Wt 73.6 kg   SpO2 99%   BMI 26.19 kg/m  Physical Exam  ED Results / Procedures / Treatments   Labs (all labs ordered are listed, but only abnormal results are displayed) Labs Reviewed  COMPREHENSIVE METABOLIC PANEL -  Abnormal; Notable for the following components:      Result Value   Glucose, Bld 110 (*)    BUN 5 (*)    All other components within normal limits  CBC WITH DIFFERENTIAL/PLATELET    EKG None  Radiology CT HEAD WO CONTRAST ( )  Result Date: 06/15/2022 CLINICAL DATA:  Headache.  Known brain tumor.  Prior fall. EXAM: CT HEAD WITHOUT CONTRAST TECHNIQUE: Contiguous axial images were obtained from the base of the skull through the vertex without intravenous contrast. RADIATION DOSE REDUCTION: This exam was performed according to the departmental dose-optimization program which includes automated exposure control, adjustment of  the mA and/or kV according to patient size and/or use of iterative reconstruction technique. COMPARISON:  CT head Jun 08, 2022. FINDINGS: Brain: No substantial change in large right cerebral hemisphere neoplasm with heterogeneous soft tissue and intermixed areas of hyperdensity which could represent small volume intratumoral hemorrhage or mineralization. Similar mass effect with approximately 9 mm of midline shift. Effacement of the suprasellar cistern, similar. No new/interval acute hemorrhage or evidence of acute large vascular territory infarct. Vascular: No hyperdense vessel identified. Skull: No acute fracture.  Burr holes. Sinuses/Orbits: Clear sinuses.  No acute orbital findings. Other: No mastoid effusions. IMPRESSION: No substantial change in large right cerebral hemisphere neoplasm with approximately 9 mm of leftward midline shift, described above. MRI with contrast could further assess if clinically warranted. Electronically Signed   By: Feliberto Harts M.D.   On: 06/15/2022 19:24    Procedures Procedures    Medications Ordered in ED Medications  dexamethasone (DECADRON) injection 4 mg (has no administration in time range)  HYDROmorphone (DILAUDID) injection 0.5 mg (has no administration in time range)  ketorolac (TORADOL) 15 MG/ML injection 15 mg (15 mg Intravenous Given 06/15/22 1745)  diphenhydrAMINE (BENADRYL) injection 25 mg (25 mg Intravenous Given 06/15/22 1830)  prochlorperazine (COMPAZINE) injection 5 mg (5 mg Intravenous Given 06/15/22 1829)    ED Course/ Medical Decision Making/ A&P Clinical Course as of 06/15/22 1931  Sat Jun 15, 2022  1810 Stable headache HX of large IC mass Headache today worse than prior Working up for bleeding/mass change [CC]    Clinical Course User Index [CC] Glyn Ade, MD                             Medical Decision Making  This patient presents to the ED for concern of headache, this involves an extensive number of treatment  options, and is a complaint that carries with it a high risk of complications and morbidity.  The differential diagnosis includes migraine, tension headache, cluster headache, subarachnoid hemorrhage, meningitis/encephalitis, acute angle closure glaucoma, giant cell arteritis, idiopathic intercranial hypertension, ischemic stroke, ICH, cervical artery dissection   Co morbidities that complicate the patient evaluation  Anaplastic astrocytoma    Lab Tests:  I Ordered, and personally interpreted labs.  The pertinent results include:   -CBC: No concern for anemia or leukocytosis -no concern for electrolyte abnormality; no concern for kidney/liver damage   Imaging Studies ordered:  I ordered imaging studies including  -CT head: No substantial change in large right cerebral hemisphere neoplasm with approximately 9 mm of leftward midline shift, described above. MRI with contrast could further assess if clinically warranted. I independently visualized and interpreted imaging I agree with the radiologist interpretation    Problem List / ED Course / Critical interventions / Medication management  Patient presents to ED with headache. Patient with  chronic left sided arm and leg weakness. Rest of neuro exam unremarkable. Provided patient with migraine cocktail which minimally helped relieve headache. Consulted with Dr. Barbaraann Cao - patient's neuro-oncologist. Doctor wondering why patient is not on hospice anymore. Doctor okay with giving her more pain medicine - he states that she is not receiving chemo anymore and there are no other treatments that can help her. I have reviewed the patients home medicines and have made adjustments as needed Patient was given return precautions. Patient stable for discharge at this time.  Patient verbalized understanding of plan.  Ddx: these are considered less likely due to history of present illness and physical exam -subarachnoid hemorrhage: no neurodeficits,  vomiting -meningitis/encephalitis: stable vital signs,  lack of meningismus symptoms -acute angle closure glaucoma: no eye pain/redness, vision loss, nausea, vomiting -giant cell arteritis: no temporal/jaw pain; fever, fatigue, weight loss, vision loss -idiopathic intercranial hypertension: no changes in vision, stiff neck, nausea -ischemic stroke/ICH/cervical artery dissection: no neurodeficits  7:31 PM Care of [name] transferred to [facility] Maryanna Shape at the end of my shift as the patient will require reassessment once labs/imaging have resulted. Patient presentation, ED course, and plan of care discussed with review of all pertinent labs and imaging. Please see his/her note for further details regarding further ED course and disposition. Plan at time of handoff is reassess patient's headache after receiving Decadron and Dilaudid. This may be altered or completely changed at the discretion of the oncoming team pending results of further workup.      Social Determinants of Health:  none         Final Clinical Impression(s) / ED Diagnoses Final diagnoses:  None    Rx / DC Orders ED Discharge Orders     None         Dorthy Cooler, [place] 06/15/22 1931    Glyn Ade, MD 06/15/22 2040

## 2022-06-15 NOTE — ED Triage Notes (Signed)
BIB EMS frequent falls. Headache posterior and rt neck, pt has history of brain tumor, chemo treatment. Pt fell on 06/08/22, was seen at [facility].

## 2022-06-16 ENCOUNTER — Emergency Department (HOSPITAL_COMMUNITY): Payer: Medicaid Other

## 2022-06-16 ENCOUNTER — Encounter (HOSPITAL_COMMUNITY): Payer: Self-pay

## 2022-06-16 ENCOUNTER — Inpatient Hospital Stay (HOSPITAL_COMMUNITY)
Admission: EM | Admit: 2022-06-16 | Discharge: 2022-06-27 | DRG: 948 | Disposition: A | Discharge status: Hospice | Payer: Medicaid Other | Attending: Internal Medicine | Admitting: Internal Medicine

## 2022-06-16 DIAGNOSIS — Z9181 History of falling: Secondary | ICD-10-CM

## 2022-06-16 DIAGNOSIS — R4589 Other symptoms and signs involving emotional state: Secondary | ICD-10-CM

## 2022-06-16 DIAGNOSIS — R55 Syncope and collapse: Secondary | ICD-10-CM

## 2022-06-16 DIAGNOSIS — Z5941 Food insecurity: Secondary | ICD-10-CM

## 2022-06-16 DIAGNOSIS — G9389 Other specified disorders of brain: Secondary | ICD-10-CM | POA: Diagnosis not present

## 2022-06-16 DIAGNOSIS — Z79899 Other long term (current) drug therapy: Secondary | ICD-10-CM

## 2022-06-16 DIAGNOSIS — B370 Candidal stomatitis: Secondary | ICD-10-CM

## 2022-06-16 DIAGNOSIS — Z515 Encounter for palliative care: Secondary | ICD-10-CM

## 2022-06-16 DIAGNOSIS — D496 Neoplasm of unspecified behavior of brain: Secondary | ICD-10-CM | POA: Diagnosis present

## 2022-06-16 DIAGNOSIS — G893 Neoplasm related pain (acute) (chronic): Secondary | ICD-10-CM | POA: Diagnosis not present

## 2022-06-16 DIAGNOSIS — Z66 Do not resuscitate: Secondary | ICD-10-CM

## 2022-06-16 DIAGNOSIS — I10 Essential (primary) hypertension: Secondary | ICD-10-CM | POA: Diagnosis present

## 2022-06-16 DIAGNOSIS — R569 Unspecified convulsions: Secondary | ICD-10-CM

## 2022-06-16 DIAGNOSIS — C719 Malignant neoplasm of brain, unspecified: Secondary | ICD-10-CM | POA: Diagnosis not present

## 2022-06-16 DIAGNOSIS — Z5982 Transportation insecurity: Secondary | ICD-10-CM

## 2022-06-16 DIAGNOSIS — H53462 Homonymous bilateral field defects, left side: Secondary | ICD-10-CM | POA: Diagnosis present

## 2022-06-16 DIAGNOSIS — M542 Cervicalgia: Secondary | ICD-10-CM

## 2022-06-16 DIAGNOSIS — R519 Headache, unspecified: Secondary | ICD-10-CM | POA: Diagnosis present

## 2022-06-16 DIAGNOSIS — G8194 Hemiplegia, unspecified affecting left nondominant side: Secondary | ICD-10-CM | POA: Diagnosis present

## 2022-06-16 DIAGNOSIS — Z85841 Personal history of malignant neoplasm of brain: Secondary | ICD-10-CM

## 2022-06-16 DIAGNOSIS — F17210 Nicotine dependence, cigarettes, uncomplicated: Secondary | ICD-10-CM | POA: Diagnosis present

## 2022-06-16 DIAGNOSIS — H547 Unspecified visual loss: Secondary | ICD-10-CM | POA: Diagnosis present

## 2022-06-16 LAB — CBC WITH DIFFERENTIAL/PLATELET
Abs Immature Granulocytes: 0.01 10*3/uL (ref 0.00–0.07)
Basophils Absolute: 0 10*3/uL (ref 0.0–0.1)
Basophils Relative: 0 %
Eosinophils Absolute: 0 10*3/uL (ref 0.0–0.5)
Eosinophils Relative: 0 %
HCT: 41 % (ref 36.0–46.0)
Hemoglobin: 13.2 g/dL (ref 12.0–15.0)
Immature Granulocytes: 0 %
Lymphocytes Relative: 14 %
Lymphs Abs: 1.1 10*3/uL (ref 0.7–4.0)
MCH: 30.8 pg (ref 26.0–34.0)
MCHC: 32.2 g/dL (ref 30.0–36.0)
MCV: 95.6 fL (ref 80.0–100.0)
Monocytes Absolute: 0.7 10*3/uL (ref 0.1–1.0)
Monocytes Relative: 9 %
Neutro Abs: 5.9 10*3/uL (ref 1.7–7.7)
Neutrophils Relative %: 77 %
Platelets: 170 10*3/uL (ref 150–400)
RBC: 4.29 MIL/uL (ref 3.87–5.11)
RDW: 14.4 % (ref 11.5–15.5)
WBC: 7.7 10*3/uL (ref 4.0–10.5)
nRBC: 0 % (ref 0.0–0.2)

## 2022-06-16 LAB — BASIC METABOLIC PANEL
Anion gap: 8 (ref 5–15)
BUN: 10 mg/dL (ref 6–20)
CO2: 25 mmol/L (ref 22–32)
Calcium: 8.9 mg/dL (ref 8.9–10.3)
Chloride: 105 mmol/L (ref 98–111)
Creatinine, Ser: 0.78 mg/dL (ref 0.44–1.00)
GFR, Estimated: >60 mL/min (ref >60)
Glucose, Bld: 108 mg/dL — ABNORMAL HIGH (ref 70–99)
Potassium: 3.9 mmol/L (ref 3.5–5.1)
Sodium: 138 mmol/L (ref 135–145)

## 2022-06-16 LAB — CBG MONITORING, ED: Glucose-Capillary: 106 mg/dL — ABNORMAL HIGH (ref 70–99)

## 2022-06-16 MED ORDER — ACETAMINOPHEN 650 MG RE SUPP: 650.0000 mg | Freq: Four times a day (QID) | RECTAL | Status: DC | PRN

## 2022-06-16 MED ORDER — HYDROMORPHONE HCL 1 MG/ML IJ SOLN
0.5000 mg | Freq: Once | INTRAMUSCULAR | Status: AC
  Administered 2022-06-16: 0.5 mg via INTRAVENOUS
  Filled 2022-06-16: qty 1

## 2022-06-16 MED ORDER — DEXAMETHASONE 4 MG PO TABS
4.0000 mg | ORAL_TABLET | Freq: Four times a day (QID) | ORAL | Status: DC
  Administered 2022-06-16 – 2022-06-24 (×28): 4 mg via ORAL
  Filled 2022-06-16 (×30): qty 1

## 2022-06-16 MED ORDER — ONDANSETRON HCL 4 MG PO TABS: 4.0000 mg | ORAL_TABLET | Freq: Four times a day (QID) | ORAL | Status: DC | PRN

## 2022-06-16 MED ORDER — ONDANSETRON HCL 4 MG/2ML IJ SOLN
4.0000 mg | Freq: Once | INTRAMUSCULAR | Status: AC
  Administered 2022-06-16: 4 mg via INTRAVENOUS
  Filled 2022-06-16: qty 2

## 2022-06-16 MED ORDER — SODIUM CHLORIDE 0.9 % IV SOLN: INTRAVENOUS | Status: DC

## 2022-06-16 MED ORDER — HEPARIN SODIUM (PORCINE) 5000 UNIT/ML IJ SOLN
5000.0000 [IU] | Freq: Three times a day (TID) | INTRAMUSCULAR | Status: DC
  Administered 2022-06-16: 5000 [IU] via SUBCUTANEOUS
  Filled 2022-06-16: qty 1

## 2022-06-16 MED ORDER — ACETAMINOPHEN 325 MG PO TABS
650.0000 mg | ORAL_TABLET | Freq: Four times a day (QID) | ORAL | Status: DC | PRN
  Administered 2022-06-17 – 2022-06-24 (×2): 650 mg via ORAL
  Filled 2022-06-16 (×2): qty 2

## 2022-06-16 MED ORDER — ONDANSETRON HCL 4 MG/2ML IJ SOLN: 4.0000 mg | Freq: Four times a day (QID) | INTRAMUSCULAR | Status: DC | PRN

## 2022-06-16 MED ORDER — LORAZEPAM 1 MG PO TABS
1.0000 mg | ORAL_TABLET | Freq: Three times a day (TID) | ORAL | Status: DC | PRN
  Administered 2022-06-19 – 2022-06-26 (×8): 1 mg via ORAL
  Filled 2022-06-16 (×9): qty 1

## 2022-06-16 NOTE — ED Triage Notes (Signed)
Pt arrived via EMS, from home. C/o weakness, near syncope. Orthostatic on EMS arrival. Hx of brain CA.   20G L hand

## 2022-06-16 NOTE — ED Notes (Signed)
ED TO INPATIENT HANDOFF REPORT  ED Nurse Name and Phone #: Melburn Hake Name/Age/Gender [name] 42 y.o. female Room/Bed: WA16/WA16  Code Status   Code Status: DNR  Home/SNF/Other Home Patient oriented to: situation Is this baseline?     Triage Complete: Triage complete  Chief Complaint Brain mass [G93.89]  Triage Note Pt arrived via EMS, from home. C/o weakness, near syncope. Orthostatic on EMS arrival. Hx of brain CA.   20G L hand    Allergies No Known Allergies  Level of Care/Admitting Diagnosis ED Disposition     ED Disposition  Admit   Condition  --   Comment  Hospital Area: [facility] [[id number]]  Level of Care: Med-Surg [16]  May place patient in observation at [facility] or [facility] if equivalent level of care is available:: No  Covid Evaluation: Confirmed COVID Negative  Diagnosis: Brain mass [161096]  Admitting Physician: Gery Pray [[id number]  Attending Physician: Alvester Chou          B Medical/Surgery History Past Medical History:  Diagnosis Date   Cancer (HCC)    Seizures (HCC)    History reviewed. No pertinent surgical history.   A IV Location/Drains/Wounds Patient Lines/Drains/Airways Status     Active Line/Drains/Airways     Name Placement date Placement time Site Days   Peripheral IV 06/16/22 20 G Anterior;Left Hand 06/16/22  1945  Hand  less than 1            Intake/Output Last 24 hours No intake or output data in the 24 hours ending 06/16/22 2242  Labs/Imaging Results for orders placed or performed during the hospital encounter of 06/16/22 (from the past 48 hour(s))  CBG monitoring, ED     Status: Abnormal   Collection Time: 06/16/22  6:11 PM  Result Value Ref Range   Glucose-Capillary 106 (H) 70 - 99 mg/dL    Comment: Glucose reference range applies only to samples taken after fasting for at least 8 hours.  CBC with Differential/Platelet     Status: None   Collection Time:  06/16/22  8:01 PM  Result Value Ref Range   WBC 7.7 4.0 - 10.5 K/uL   RBC 4.29 3.87 - 5.11 MIL/uL   Hemoglobin 13.2 12.0 - 15.0 g/dL   HCT 04.5 40.9 - 81.1 %   MCV 95.6 80.0 - 100.0 fL   MCH 30.8 26.0 - 34.0 pg   MCHC 32.2 30.0 - 36.0 g/dL   RDW 91.4 78.2 - 95.6 %   Platelets 170 150 - 400 K/uL   nRBC 0.0 0.0 - 0.2 %   Neutrophils Relative % 77 %   Neutro Abs 5.9 1.7 - 7.7 K/uL   Lymphocytes Relative 14 %   Lymphs Abs 1.1 0.7 - 4.0 K/uL   Monocytes Relative 9 %   Monocytes Absolute 0.7 0.1 - 1.0 K/uL   Eosinophils Relative 0 %   Eosinophils Absolute 0.0 0.0 - 0.5 K/uL   Basophils Relative 0 %   Basophils Absolute 0.0 0.0 - 0.1 K/uL   Immature Granulocytes 0 %   Abs Immature Granulocytes 0.01 0.00 - 0.07 K/uL    Comment: Performed at [facility], [address], [place], [place] [address]  Basic metabolic panel     Status: Abnormal   Collection Time: 06/16/22  8:01 PM  Result Value Ref Range   Sodium 138 135 - 145 mmol/L   Potassium 3.9 3.5 - 5.1 mmol/L   Chloride 105  98 - 111 mmol/L   CO2 25 22 - 32 mmol/L   Glucose, Bld 108 (H) 70 - 99 mg/dL    Comment: Glucose reference range applies only to samples taken after fasting for at least 8 hours.   BUN 10 6 - 20 mg/dL   Creatinine, Ser 2.95 0.44 - 1.00 mg/dL   Calcium 8.9 8.9 - 62.1 mg/dL   GFR, Estimated >30 >86 mL/min    Comment: (NOTE) Calculated using the CKD-EPI Creatinine Equation (2021)    Anion gap 8 5 - 15    Comment: Performed at [facility], [address], [place], [place] [address]   CT Head Wo Contrast  Result Date: 06/16/2022 CLINICAL DATA:  Brain/CNS neoplasm. Monitor headache, sudden, severe. CT yesterday but returned with worsening headache. Syncope. EXAM: CT HEAD WITHOUT CONTRAST TECHNIQUE: Contiguous axial images were obtained from the base of the skull through the vertex without intravenous contrast. RADIATION DOSE REDUCTION: This exam was performed according to  the departmental dose-optimization program which includes automated exposure control, adjustment of the mA and/or kV according to patient size and/or use of iterative reconstruction technique. 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 The hyperdensity could represent small volume intratumoral hemorrhage or mineralization and is not substantially changed. There is unchanged effacement of the right lateral ventricle and third ventricle. Stable ventricular caliber of the left lateral ventricle. Unchanged 9 mm of leftward midline shift at the septum pellucidum. Unchanged effacement of the suprasellar cistern. No new hemorrhage or evidence of acute infarct. Vascular: No hyperdense vessel. Skull: No acute fracture.  Right burr holes. Sinuses/Orbits: No acute finding. Other: None. IMPRESSION: 1. No substantial change in the right cerebral hemisphere neoplasm. 2. Unchanged effacement of the right lateral ventricle and third ventricle with 9 mm of leftward midline shift. Stable ventricular caliber of the left lateral ventricle. 3. No new hemorrhage or evidence of acute infarct. Electronically Signed   By: Minerva Fester M.D.   On: 06/16/2022 19:38   CT HEAD WO CONTRAST ( )  Result Date: 06/15/2022 CLINICAL DATA:  Headache.  Known brain tumor.  Prior fall. EXAM: CT HEAD WITHOUT CONTRAST TECHNIQUE: Contiguous axial images were obtained from the base of the skull through the vertex without intravenous contrast. RADIATION DOSE REDUCTION: This exam was performed according to the departmental dose-optimization program which includes automated exposure control, adjustment of the mA and/or kV according to patient size and/or use of iterative reconstruction technique. COMPARISON:  CT head Jun 08, 2022. FINDINGS: Brain: No substantial change in large right cerebral hemisphere neoplasm with  heterogeneous soft tissue and intermixed areas of hyperdensity which could represent small volume intratumoral hemorrhage or mineralization. Similar mass effect with approximately 9 mm of midline shift. Effacement of the suprasellar cistern, similar. No new/interval acute hemorrhage or evidence of acute large vascular territory infarct. Vascular: No hyperdense vessel identified. Skull: No acute fracture.  Burr holes. Sinuses/Orbits: Clear sinuses.  No acute orbital findings. Other: No mastoid effusions. IMPRESSION: No substantial change in large right cerebral hemisphere neoplasm with approximately 9 mm of leftward midline shift, described above. MRI with contrast could further assess if clinically warranted. Electronically Signed   By: Feliberto Harts M.D.   On: 06/15/2022 19:24    Pending Labs Unresulted Labs (From admission, onward)     Start     Ordered   06/17/22 0500  Basic metabolic panel  Tomorrow morning,  R        06/16/22 2147   06/17/22 0500  CBC with Differential/Platelet  Tomorrow morning,   R        06/16/22 2147   06/16/22 2140  CBC  (heparin)  Once,   R       Comments: Baseline for heparin therapy IF NOT ALREADY DRAWN.  Notify MD if PLT < 100 K.    06/16/22 2147   06/16/22 2140  Creatinine, serum  (heparin)  Once,   R       Comments: Baseline for heparin therapy IF NOT ALREADY DRAWN.    06/16/22 2147            Vitals/Pain Today's Vitals   06/16/22 1900 06/16/22 2158  BP: 127/82   Pulse: 65   Resp: 18   Temp:  97.6 F (36.4 C)  TempSrc:  Oral  SpO2: 100%     Isolation Precautions No active isolations  Medications Medications  0.9 %  sodium chloride infusion ( Intravenous New Bag/Given 06/16/22 1945)  heparin injection 5,000 Units (has no administration in time range)  acetaminophen (TYLENOL) tablet 650 mg (has no administration in time range)    Or  acetaminophen (TYLENOL) suppository 650 mg (has no administration in time range)  ondansetron (ZOFRAN)  tablet 4 mg (has no administration in time range)    Or  ondansetron (ZOFRAN) injection 4 mg (has no administration in time range)  dexamethasone (DECADRON) tablet 4 mg (has no administration in time range)  LORazepam (ATIVAN) tablet 1 mg (has no administration in time range)  HYDROmorphone (DILAUDID) injection 0.5 mg (0.5 mg Intravenous Given 06/16/22 1946)  ondansetron (ZOFRAN) injection 4 mg (4 mg Intravenous Given 06/16/22 1946)    Mobility walks with person assist     Focused Assessments Neuro Assessment Handoff:  Swallow screen pass? Yes          Neuro Assessment:   Neuro Checks:      Has TPA been given? No If patient is a Neuro Trauma and patient is going to OR before floor call report to 4N Charge nurse: [phone] or [phone]   R Recommendations: See Admitting Provider Note  Report given to:   Additional Notes: N/A

## 2022-06-16 NOTE — H&P (Signed)
PCP:   [facility], [facility]   Chief Complaint:  Syncope  HPI: This is a 42 year old female with a past medical history of recurrent brain tumor / Anaplastic astrocytoma.  Astrocytoma initially diagnosed in 2018.  She was treated and went into remission.  The patient presents after near syncopal event.  Patient states she was dizzy prior to passing out.  She endorses a mild headache and photophobia.  Family called 911, she was brought to the ER.  Per patient she was feeling weak all day.  The house was hot and she laid on the couch all day.  She got up, felt lightheaded and almost passed out.  This has occurred to the patient twice before.  States patient states she fell last week once and hit her right side.  She dropped her pill bottle at that time.  Her daughter swept her medications up and threw them away.  Patient states she has been out of her lorazepam, Tylenol, and ibuprofen.  She has been suffering with pain but unable to purchase new medications due to lack of funds.  She states since her tumor has returned, she has lost some upper extremity strength, initially in the left arm, now in both arms.  She denies nausea or vomiting, stating her appetite is good and she has had no weight loss.  She denies any confusion.  Patient was in the ER yesterday with a headache and photophobia.  Patient with known end-stage astrocytoma.  She was previously on hospice but left, contacting her oncologist for stating she want to resume chemo.  Her CT head showed unchanged neoplasm with 9 mm left midline shift.  She was given IV Decadron and discharged home.  She is maintained on twice daily Decadron.  On presentation to the ER, patient was orthostatic per intake nursing notes.  I am unable to find those vitals.  CT head was negative for acute hemorrhage or CVA.  Otherwise unchanged from prior CT.  Review of Systems:  Per HPI  Past Medical History: Past Medical History:  Diagnosis Date   Cancer  (HCC)    Seizures (HCC)    History reviewed. No pertinent surgical history.  Medications: Prior to Admission medications   Medication Sig Start Date End Date Taking? Authorizing Provider  amitriptyline (ELAVIL) 50 MG tablet TAKE 1 TABLET BY MOUTH EVERYDAY AT BEDTIME Patient taking differently: Take 50 mg by mouth at bedtime. 03/28/22  Yes Vaslow, Georgeanna Lea, MD  dexamethasone (DECADRON) 4 MG tablet Take 1 tablet (4 mg total) by mouth 4 (four) times daily. 04/30/22 06/29/22 Yes [place], Alvira Philips, DO  HYDROcodone-acetaminophen (NORCO) 10-325 MG tablet Take 1 tablet by mouth every 6 (six) hours as needed for up to 2 days for severe pain. 06/15/22 06/17/22  Smitty Knudsen, PA-C  ivosidenib (TIBSOVO) 250 MG tablet Take 2 tablets (500 mg total) by mouth daily. Avoid taking with a high-fat meal. 03/28/22   Vaslow, Georgeanna Lea, MD  levETIRAcetam (KEPPRA) 1000 MG tablet Take 1 tablet (1,000 mg total) by mouth 2 (two) times daily. 04/30/22 07/29/22  [place], Eric J, DO  LORazepam (ATIVAN) 1 MG tablet Take 1 tablet (1 mg total) by mouth 3 (three) times daily as needed for anxiety. 04/30/22   [place], Alvira Philips, DO  methocarbamol (ROBAXIN) 500 MG tablet Take 500 mg by mouth 3 (three) times daily as needed for muscle spasms. 04/26/21   [provider]  Multiple Vitamins-Minerals (HM MULTIVITAMIN ADULT GUMMY PO) Take 2 tablets by  mouth daily.    [provider]  omeprazole (PRILOSEC) 20 MG capsule Take 20 mg by mouth daily. 04/19/21   [provider]  ondansetron (ZOFRAN) 4 MG tablet Take 1 tablet (4 mg total) by mouth daily as needed for nausea or vomiting. 04/30/22 04/30/23  [place], Alvira Philips, DO  oxyCODONE (ROXICODONE) 15 MG immediate release tablet Take 1 tablet (15 mg total) by mouth every 4 (four) hours as needed for moderate pain, severe pain or breakthrough pain. 04/30/22   [place], Eric J, DO  polyethylene glycol (MIRALAX / GLYCOLAX) 17 g packet Take 17 g by mouth daily. 04/30/22 07/29/22  [place], Alvira Philips, DO   potassium chloride SA (KLOR-CON M) 20 MEQ tablet Take 20 mEq by mouth 2 (two) times daily.    [provider]  senna (SENOKOT) 8.6 MG TABS tablet Take 1 tablet (8.6 mg total) by mouth daily. 04/30/22 07/29/22  [place], Eric J, DO    Allergies:  No Known Allergies  Social History:  reports that she has been smoking cigarettes. She has a 2.50 pack-year smoking history. She has never used smokeless tobacco. She reports that she does not currently use alcohol after a past usage of about 2.0 standard drinks of alcohol per week. She reports that she does not currently use drugs.  Family History: Family History  Problem Relation Age of Onset   Cancer Mother     Physical Exam: Vitals:   06/16/22 1900 06/16/22 2158  BP: 127/82   Pulse: 65   Resp: 18   Temp:  97.6 F (36.4 C)  TempSrc:  Oral  SpO2: 100%     General:  Alert and oriented times three, well developed and nourished, weak patient Eyes: Pink conjunctiva, no scleral icterus ENT: Moist oral mucosa, neck supple, no thyromegaly Lungs: clear to ascultation, no wheeze, no crackles, no use of accessory muscles Cardiovascular: regular rate and rhythm, no regurgitation, no gallops, no murmurs. No carotid bruits, no JVD Abdomen: soft, positive BS, non-tender, non-distended, no organomegaly, not an acute abdomen GU: not examined Neuro: CN II - XII grossly intact, sensation intact Musculoskeletal: strength 5/5 all extremities, no clubbing, cyanosis or edema Skin: no rash, no subcutaneous crepitation, no decubitus Psych: Weak, slightly delayed response, alert and oriented x 3.    Labs on Admission:  Recent Labs    06/15/22 1715 06/16/22 2001  NA 135 138  K 3.5 3.9  CL 101 105  CO2 24 25  GLUCOSE 110* 108*  BUN 5* 10  CREATININE 0.63 0.78  CALCIUM 9.0 8.9   Recent Labs    06/15/22 1715  AST 16  ALT 18  ALKPHOS 43  BILITOT 0.5  PROT 7.3  ALBUMIN 4.2    Recent Labs    06/15/22 1715 06/16/22 2001  WBC 6.3  7.7  NEUTROABS 5.3 5.9  HGB 14.6 13.2  HCT 45.3 41.0  MCV 93.6 95.6  PLT 193 170     Radiological Exams on Admission: CT Head Wo Contrast  Result Date: 06/16/2022 CLINICAL DATA:  Brain/CNS neoplasm. Monitor headache, sudden, severe. CT yesterday but returned with worsening headache. Syncope. EXAM: CT HEAD WITHOUT CONTRAST TECHNIQUE: Contiguous axial images were obtained from the base of the skull through the vertex without intravenous contrast. RADIATION DOSE REDUCTION: This exam was performed according to the departmental dose-optimization program which includes automated exposure control, adjustment of the mA and/or kV according to patient size and/or use of iterative reconstruction technique. COMPARISON:  CT 06/15/2022 FINDINGS: Brain: No substantial  change in the large heterogenous ill-defined right cerebral hemisphere neoplasm with areas of intermixed hyperdensity. The hyperdensity could represent small volume intratumoral hemorrhage or mineralization and is not substantially changed. There is unchanged effacement of the right lateral ventricle and third ventricle. Stable ventricular caliber of the left lateral ventricle. Unchanged 9 mm of leftward midline shift at the septum pellucidum. Unchanged effacement of the suprasellar cistern. No new hemorrhage or evidence of acute infarct. Vascular: No hyperdense vessel. Skull: No acute fracture.  Right burr holes. Sinuses/Orbits: No acute finding. Other: None. IMPRESSION: 1. No substantial change in the right cerebral hemisphere neoplasm. 2. Unchanged effacement of the right lateral ventricle and third ventricle with 9 mm of leftward midline shift. Stable ventricular caliber of the left lateral ventricle. 3. No new hemorrhage or evidence of acute infarct. Electronically Signed   By: Minerva Fester M.D.   On: 06/16/2022 19:38   CT HEAD WO CONTRAST ( )  Result Date: 06/15/2022 CLINICAL DATA:  Headache.  Known brain tumor.  Prior fall. EXAM: CT HEAD  WITHOUT CONTRAST TECHNIQUE: Contiguous axial images were obtained from the base of the skull through the vertex without intravenous contrast. RADIATION DOSE REDUCTION: This exam was performed according to the departmental dose-optimization program which includes automated exposure control, adjustment of the mA and/or kV according to patient size and/or use of iterative reconstruction technique. COMPARISON:  CT head Jun 08, 2022. FINDINGS: Brain: No substantial change in large right cerebral hemisphere neoplasm with heterogeneous soft tissue and intermixed areas of hyperdensity which could represent small volume intratumoral hemorrhage or mineralization. Similar mass effect with approximately 9 mm of midline shift. Effacement of the suprasellar cistern, similar. No new/interval acute hemorrhage or evidence of acute large vascular territory infarct. Vascular: No hyperdense vessel identified. Skull: No acute fracture.  Burr holes. Sinuses/Orbits: Clear sinuses.  No acute orbital findings. Other: No mastoid effusions. IMPRESSION: No substantial change in large right cerebral hemisphere neoplasm with approximately 9 mm of leftward midline shift, described above. MRI with contrast could further assess if clinically warranted. Electronically Signed   By: Feliberto Harts M.D.   On: 06/15/2022 19:24    Assessment/Plan Present on Admission:  Near syncopal event -DDx: Brain tumor/orthostatic/known midline shift w/ swelling/CVA/hemorrhage -Orthostatic vitals ordered, IV fluid hydration -EDP contacted patient's oncologist Dr. Barbaraann Cao last night.  He has been unsuccessfully trying to see patient since she left hospice.  This has been unsuccessful due to myriad of issues including transportation.  He would like patient admitted, he will see patient in the AM.  He will broach discussion re: palliative care. -Will rule out infection with UA and chest x-ray.   Cancer associated pain  Anaplastic astrocytoma (HCC) -Dr.  Barbaraann Cao to see patient in AM.  Plan still open discussion with patient re transition o palliative care. -A.m. team to consult palliative after discussion complete -Continue Decadron twice daily -Pain control   Focal seizures -Continue Keppra twice daily   Tobacco use -Nicotine patch  Paula Farmer 06/16/2022, 10:16 PM

## 2022-06-16 NOTE — ED Provider Notes (Addendum)
[place] [facility] DEPARTMENT AT [facility] Provider Note   CSN: [id number] Arrival date & time: 06/16/22  1803     History  Chief Complaint  Patient presents with   Near Syncope    Paula Farmer is a 42 y.o. female.  Patient brought in by EMS for the complaint of near syncope at home.  Patient alert here.  Also complaining of headache and photophobia.  Patient was seen here yesterday they discussed the patient's case with on-call oncology she is normally followed by [facility].  She had head CT done yesterday that did have some abnormalities but she is known to have kind of end-stage astrocytoma.  Patient was on hospice care but recently here the beginning of May contacted the cancer center did Dr. Barbaraann Cao and she wanted to undergo treatment again and he is setting up an appointment for follow-up with her.  Patient also was discharged home with pain medication but she did not get it filled.  Patient is on Decadron 4 mg 4 times a day.  We also yesterday gave her an additional dose of Decadron IV and gave her some IV pain medicine.  Patient felt better when she was discharged home.  As stated above patient did not get her pain medicine filled they called EMS before they got that filled.  Patient's history starts in 2018 she has been through multiple treatments.  Known to have anaplastic astrocytoma.       Home Medications Prior to Admission medications   Medication Sig Start Date End Date Taking? Authorizing Provider  amitriptyline (ELAVIL) 50 MG tablet TAKE 1 TABLET BY MOUTH EVERYDAY AT BEDTIME Patient taking differently: Take 50 mg by mouth at bedtime. 03/28/22  Yes Vaslow, Georgeanna Lea, MD  dexamethasone (DECADRON) 4 MG tablet Take 1 tablet (4 mg total) by mouth 4 (four) times daily. 04/30/22 06/29/22 Yes [place], Alvira Philips, DO  HYDROcodone-acetaminophen (NORCO) 10-325 MG tablet Take 1 tablet by mouth every 6 (six) hours as needed for up to 2 days for severe pain. 06/15/22 06/17/22   Smitty Knudsen, PA-C  ivosidenib (TIBSOVO) 250 MG tablet Take 2 tablets (500 mg total) by mouth daily. Avoid taking with a high-fat meal. 03/28/22   Vaslow, Georgeanna Lea, MD  levETIRAcetam (KEPPRA) 1000 MG tablet Take 1 tablet (1,000 mg total) by mouth 2 (two) times daily. 04/30/22 07/29/22  [place], Eric J, DO  LORazepam (ATIVAN) 1 MG tablet Take 1 tablet (1 mg total) by mouth 3 (three) times daily as needed for anxiety. 04/30/22   [place], Alvira Philips, DO  methocarbamol (ROBAXIN) 500 MG tablet Take 500 mg by mouth 3 (three) times daily as needed for muscle spasms. 04/26/21   [provider]  Multiple Vitamins-Minerals (HM MULTIVITAMIN ADULT GUMMY PO) Take 2 tablets by mouth daily.    [provider]  omeprazole (PRILOSEC) 20 MG capsule Take 20 mg by mouth daily. 04/19/21   [provider]  ondansetron (ZOFRAN) 4 MG tablet Take 1 tablet (4 mg total) by mouth daily as needed for nausea or vomiting. 04/30/22 04/30/23  [place], Alvira Philips, DO  oxyCODONE (ROXICODONE) 15 MG immediate release tablet Take 1 tablet (15 mg total) by mouth every 4 (four) hours as needed for moderate pain, severe pain or breakthrough pain. 04/30/22   [place], Eric J, DO  polyethylene glycol (MIRALAX / GLYCOLAX) 17 g packet Take 17 g by mouth daily. 04/30/22 07/29/22  [place], Alvira Philips, DO  potassium chloride SA (KLOR-CON M) 20 MEQ tablet Take  20 mEq by mouth 2 (two) times daily.    [provider]  senna (SENOKOT) 8.6 MG TABS tablet Take 1 tablet (8.6 mg total) by mouth daily. 04/30/22 07/29/22  [place], Eric J, DO      Allergies    Patient has no known allergies.    Review of Systems   Review of Systems  Constitutional:  Negative for chills and fever.  HENT:  Negative for ear pain and sore throat.   Eyes:  Positive for photophobia. Negative for pain and visual disturbance.  Respiratory:  Negative for cough and shortness of breath.   Cardiovascular:  Negative for chest pain and palpitations.  Gastrointestinal:   Negative for abdominal pain and vomiting.  Genitourinary:  Negative for dysuria and hematuria.  Musculoskeletal:  Negative for arthralgias and back pain.  Skin:  Negative for color change and rash.  Neurological:  Positive for headaches. Negative for seizures and syncope.  All other systems reviewed and are negative.   Physical Exam Updated Vital Signs There were no vitals taken for this visit. Physical Exam Vitals and nursing note reviewed.  Constitutional:      General: She is not in acute distress.    Appearance: Normal appearance. She is well-developed.  HENT:     Head: Normocephalic and atraumatic.     Mouth/Throat:     Mouth: Mucous membranes are moist.  Eyes:     Extraocular Movements: Extraocular movements intact.     Conjunctiva/sclera: Conjunctivae normal.     Pupils: Pupils are equal, round, and reactive to light.  Cardiovascular:     Rate and Rhythm: Normal rate and regular rhythm.     Heart sounds: No murmur heard. Pulmonary:     Effort: Pulmonary effort is normal. No respiratory distress.     Breath sounds: Normal breath sounds.  Abdominal:     Palpations: Abdomen is soft.     Tenderness: There is no abdominal tenderness.  Musculoskeletal:        General: No swelling.     Cervical back: Normal range of motion and neck supple. No rigidity.  Skin:    General: Skin is warm and dry.     Capillary Refill: Capillary refill takes less than 2 seconds.  Neurological:     Mental Status: She is alert. Mental status is at baseline.  Psychiatric:        Mood and Affect: Mood normal.     ED Results / Procedures / Treatments   Labs (all labs ordered are listed, but only abnormal results are displayed) Labs Reviewed  BASIC METABOLIC PANEL - Abnormal; Notable for the following components:      Result Value   Glucose, Bld 108 (*)    All other components within normal limits  CBG MONITORING, ED - Abnormal; Notable for the following components:   Glucose-Capillary 106  (*)    All other components within normal limits  CBC WITH DIFFERENTIAL/PLATELET    EKG None ED ECG REPORT   Date: 06/16/2022  Rate: 64  Rhythm: normal sinus rhythm  QRS Axis: normal  Intervals: normal  ST/T Wave abnormalities: normal  Conduction Disutrbances:none  Narrative Interpretation:   Old EKG Reviewed: none available  I have personally reviewed the EKG tracing and agree with the computerized printout as noted.  Radiology CT Head Wo Contrast  Result Date: 06/16/2022 CLINICAL DATA:  Brain/CNS neoplasm. Monitor headache, sudden, severe. CT yesterday but returned with worsening headache. Syncope. EXAM: CT HEAD WITHOUT CONTRAST TECHNIQUE: Contiguous axial images  were obtained from the base of the skull through the vertex without intravenous contrast. RADIATION DOSE REDUCTION: This exam was performed according to the departmental dose-optimization program which includes automated exposure control, adjustment of the mA and/or kV according to patient size and/or use of iterative reconstruction technique. 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 The hyperdensity could represent small volume intratumoral hemorrhage or mineralization and is not substantially changed. There is unchanged effacement of the right lateral ventricle and third ventricle. Stable ventricular caliber of the left lateral ventricle. Unchanged 9 mm of leftward midline shift at the septum pellucidum. Unchanged effacement of the suprasellar cistern. No new hemorrhage or evidence of acute infarct. Vascular: No hyperdense vessel. Skull: No acute fracture.  Right burr holes. Sinuses/Orbits: No acute finding. Other: None. IMPRESSION: 1. No substantial change in the right cerebral hemisphere neoplasm. 2. Unchanged effacement of the right lateral ventricle and third ventricle with 9 mm of leftward  midline shift. Stable ventricular caliber of the left lateral ventricle. 3. No new hemorrhage or evidence of acute infarct. Electronically Signed   By: Minerva Fester M.D.   On: 06/16/2022 19:38   CT HEAD WO CONTRAST ( )  Result Date: 06/15/2022 CLINICAL DATA:  Headache.  Known brain tumor.  Prior fall. EXAM: CT HEAD WITHOUT CONTRAST TECHNIQUE: Contiguous axial images were obtained from the base of the skull through the vertex without intravenous contrast. RADIATION DOSE REDUCTION: This exam was performed according to the departmental dose-optimization program which includes automated exposure control, adjustment of the mA and/or kV according to patient size and/or use of iterative reconstruction technique. COMPARISON:  CT head Jun 08, 2022. FINDINGS: Brain: No substantial change in large right cerebral hemisphere neoplasm with heterogeneous soft tissue and intermixed areas of hyperdensity which could represent small volume intratumoral hemorrhage or mineralization. Similar mass effect with approximately 9 mm of midline shift. Effacement of the suprasellar cistern, similar. No new/interval acute hemorrhage or evidence of acute large vascular territory infarct. Vascular: No hyperdense vessel identified. Skull: No acute fracture.  Burr holes. Sinuses/Orbits: Clear sinuses.  No acute orbital findings. Other: No mastoid effusions. IMPRESSION: No substantial change in large right cerebral hemisphere neoplasm with approximately 9 mm of leftward midline shift, described above. MRI with contrast could further assess if clinically warranted. Electronically Signed   By: Feliberto Harts M.D.   On: 06/15/2022 19:24    Procedures Procedures    Medications Ordered in ED Medications  0.9 %  sodium chloride infusion ( Intravenous New Bag/Given 06/16/22 1945)  HYDROmorphone (DILAUDID) injection 0.5 mg (0.5 mg Intravenous Given 06/16/22 1946)  ondansetron (ZOFRAN) injection 4 mg (4 mg Intravenous Given 06/16/22 1946)     ED Course/ Medical Decision Making/ A&P                             Medical Decision Making Amount and/or Complexity of Data Reviewed Labs: ordered. Radiology: ordered.  Risk Prescription drug management. Decision regarding hospitalization.   Workup here today patient was given some IV hydromorphone just 0.5 mg feeling much better.  Also given some antinausea medicine with it as well.  Had CBC basic metabolic panel.  Patient had a complete metabolic panel yesterday.  And patient had repeat head CT just to make sure there was not any drastic change from yesterday since she states that she felt much worse.  Mostly done  to rule out a bleed.  CBC white count 7.7 hemoglobin 13.2 platelets 170.  Basic metabolic panel electrolytes normal glucose 108 creatinine 0.78.  Head CT shows no substantial change in the right cerebral hemisphere neoplasm unchanged effacement of the right lateral ventricle and third ventricle with 9 mm of leftward midline shift stable ventricular caliber of the left lateral ventricle no new hemorrhage or evidence of acute infarct.  Did confirm that patient does have Decadron to take at home.  Will discuss again with oncology.  I do not see any clear-cut reason for admission however there are some social issues at home patient does not have a phone and it does appear that family is kind of contacting EMS fairly frequently to bring her in because she was seen yesterday as well.  So we will discuss with oncology and see what their thoughts are.  Patient is EKG did not crossover.  But it showed sinus rhythm rate of 64.  No significant abnormalities.  No arrhythmias.  Discussed with Dr. Barbaraann Cao.  We both concur that there is no hard-core reason for admission but she is got a lot of social problems she is missing appointments.  She is end-stage astrocytoma.  Would best benefit from palliative care he is recommending to have her admitted to the medicine service have him  continue her Decadron continue IV pain medicine and he will see her in the morning also recommending for palliative care consult.   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)    Rx / DC Orders ED Discharge Orders     None         Vanetta Mulders, MD 06/16/22 2040    Vanetta Mulders, MD 06/16/22 [id number]    Vanetta Mulders, MD 06/16/22 2119

## 2022-06-17 ENCOUNTER — Observation Stay (HOSPITAL_COMMUNITY): Payer: Medicaid Other

## 2022-06-17 ENCOUNTER — Other Ambulatory Visit: Payer: Self-pay

## 2022-06-17 DIAGNOSIS — G9389 Other specified disorders of brain: Secondary | ICD-10-CM

## 2022-06-17 LAB — CBC WITH DIFFERENTIAL/PLATELET
Abs Immature Granulocytes: 0.01 10*3/uL (ref 0.00–0.07)
Basophils Absolute: 0 10*3/uL (ref 0.0–0.1)
Basophils Relative: 0 %
Eosinophils Absolute: 0 10*3/uL (ref 0.0–0.5)
Eosinophils Relative: 0 %
HCT: 37.2 % (ref 36.0–46.0)
Hemoglobin: 11.9 g/dL — ABNORMAL LOW (ref 12.0–15.0)
Immature Granulocytes: 0 %
Lymphocytes Relative: 13 %
Lymphs Abs: 0.7 10*3/uL (ref 0.7–4.0)
MCH: 30.9 pg (ref 26.0–34.0)
MCHC: 32 g/dL (ref 30.0–36.0)
MCV: 96.6 fL (ref 80.0–100.0)
Monocytes Absolute: 0.3 10*3/uL (ref 0.1–1.0)
Monocytes Relative: 5 %
Neutro Abs: 4.7 10*3/uL (ref 1.7–7.7)
Neutrophils Relative %: 82 %
Platelets: 163 10*3/uL (ref 150–400)
RBC: 3.85 MIL/uL — ABNORMAL LOW (ref 3.87–5.11)
RDW: 14.5 % (ref 11.5–15.5)
WBC: 5.7 10*3/uL (ref 4.0–10.5)
nRBC: 0 % (ref 0.0–0.2)

## 2022-06-17 LAB — URINALYSIS, ROUTINE W REFLEX MICROSCOPIC
Bilirubin Urine: NEGATIVE
Glucose, UA: NEGATIVE mg/dL
Ketones, ur: NEGATIVE mg/dL
Nitrite: NEGATIVE
Protein, ur: NEGATIVE mg/dL
Specific Gravity, Urine: 1.017 (ref 1.005–1.030)
pH: 5 (ref 5.0–8.0)

## 2022-06-17 LAB — BASIC METABOLIC PANEL
Anion gap: 5 (ref 5–15)
BUN: 7 mg/dL (ref 6–20)
CO2: 24 mmol/L (ref 22–32)
Calcium: 8.2 mg/dL — ABNORMAL LOW (ref 8.9–10.3)
Chloride: 107 mmol/L (ref 98–111)
Creatinine, Ser: 0.67 mg/dL (ref 0.44–1.00)
GFR, Estimated: >60 mL/min (ref >60)
Glucose, Bld: 117 mg/dL — ABNORMAL HIGH (ref 70–99)
Potassium: 4.2 mmol/L (ref 3.5–5.1)
Sodium: 136 mmol/L (ref 135–145)

## 2022-06-17 MED ORDER — SENNA 8.6 MG PO TABS
1.0000 | ORAL_TABLET | Freq: Every day | ORAL | Status: DC
  Administered 2022-06-17 – 2022-06-27 (×10): 8.6 mg via ORAL
  Filled 2022-06-17 (×10): qty 1

## 2022-06-17 MED ORDER — LEVETIRACETAM 500 MG PO TABS
1500.0000 mg | ORAL_TABLET | Freq: Two times a day (BID) | ORAL | Status: DC
  Administered 2022-06-17 – 2022-06-23 (×13): 1500 mg via ORAL
  Filled 2022-06-17 (×13): qty 3

## 2022-06-17 MED ORDER — IVOSIDENIB 250 MG PO TABS: 500.0000 mg | ORAL_TABLET | Freq: Every day | ORAL | Status: DC

## 2022-06-17 MED ORDER — HYDROCODONE-ACETAMINOPHEN 5-325 MG PO TABS: 1.0000 | ORAL_TABLET | Freq: Four times a day (QID) | ORAL | Status: DC | PRN

## 2022-06-17 MED ORDER — AMITRIPTYLINE HCL 25 MG PO TABS
50.0000 mg | ORAL_TABLET | Freq: Every day | ORAL | Status: DC
  Administered 2022-06-17 – 2022-06-26 (×10): 50 mg via ORAL
  Filled 2022-06-17 (×11): qty 2

## 2022-06-17 MED ORDER — OXYCODONE HCL 5 MG PO TABS
15.0000 mg | ORAL_TABLET | ORAL | Status: DC | PRN
  Administered 2022-06-17 – 2022-06-27 (×17): 15 mg via ORAL
  Filled 2022-06-17 (×17): qty 3

## 2022-06-17 MED ORDER — NICOTINE 14 MG/24HR TD PT24
14.0000 mg | MEDICATED_PATCH | Freq: Every day | TRANSDERMAL | Status: DC
  Filled 2022-06-17: qty 1

## 2022-06-17 MED ORDER — PANTOPRAZOLE SODIUM 40 MG PO TBEC
40.0000 mg | DELAYED_RELEASE_TABLET | Freq: Every day | ORAL | Status: DC
  Administered 2022-06-17 – 2022-06-27 (×11): 40 mg via ORAL
  Filled 2022-06-17 (×11): qty 1

## 2022-06-17 MED ORDER — LEVETIRACETAM 500 MG PO TABS
1000.0000 mg | ORAL_TABLET | Freq: Two times a day (BID) | ORAL | Status: DC
  Administered 2022-06-17 (×2): 1000 mg via ORAL
  Filled 2022-06-17 (×2): qty 2

## 2022-06-17 NOTE — Consult Note (Signed)
[facility] Neuro-Oncology Consult Note  Patient Care Team: Center, [facility] Medical as PCP - General  CHIEF COMPLAINTS/PURPOSE OF CONSULTATION:  Episode of LOC Astrocytoma  HISTORY OF PRESENTING ILLNESS:  Paula Farmer 42 y.o. female presented with episode of loss of consciousness at home.  Following event, she was confused and "out of it" for some time.  CNS imaging in the ED demonstrated no frank changes.  She continues to experience constant headaches, body pain as prior.  No longer on hospice.  Left side remains densely weak, now with impaired vision on the left as well.  MEDICAL HISTORY:  Past Medical History:  Diagnosis Date   Cancer (HCC)    Seizures (HCC)     SURGICAL HISTORY: History reviewed. No pertinent surgical history.  SOCIAL HISTORY: Social History   Socioeconomic History   Marital status: Single    Spouse name: Not on file   Number of children: Not on file   Years of education: Not on file   Highest education level: Not on file  Occupational History   Not on file  Tobacco Use   Smoking status: Some Days    Packs/day: 0.25    Years: 10.00    Additional pack years: 0.00    Total pack years: 2.50    Types: Cigarettes   Smokeless tobacco: Never  Vaping Use   Vaping Use: Never used  Substance and Sexual Activity   Alcohol use: Not Currently    Alcohol/week: 2.0 standard drinks of alcohol    Types: 2 Glasses of wine per week    Comment: drinks wine twice a month   Drug use: Not Currently   Sexual activity: Not Currently  Other Topics Concern   Not on file  Social History Narrative   Not on file   Social Determinants of Health   Financial Resource Strain: Not on file  Food Insecurity: Food Insecurity Present (06/17/2022)   Hunger Vital Sign    Worried About Running Out of Food in the Last Year: Sometimes true    Ran Out of Food in the Last Year: Sometimes true  Transportation Needs: Unmet Transportation Needs  (06/17/2022)   PRAPARE - Administrator, Civil Service (Medical): Yes    Lack of Transportation (Non-Medical): Yes  Physical Activity: Not on file  Stress: Not on file  Social Connections: Not on file  Intimate Partner Violence: Not At Risk (06/17/2022)   Humiliation, Afraid, Rape, and Kick questionnaire    Fear of Current or Ex-Partner: No    Emotionally Abused: No    Physically Abused: No    Sexually Abused: No    FAMILY HISTORY: Family History  Problem Relation Age of Onset   Cancer Mother     ALLERGIES:  has No Known Allergies.  MEDICATIONS:  Current Facility-Administered Medications  Medication Dose Route Frequency Provider Last Rate Last Admin   0.9 %  sodium chloride infusion   Intravenous Continuous Vanetta Mulders, MD 75 mL/hr at 06/16/22 2359 New Bag at 06/16/22 2359   acetaminophen (TYLENOL) tablet 650 mg  650 mg Oral Q6H PRN Gery Pray, MD   650 mg at 06/17/22 0809   Or   acetaminophen (TYLENOL) suppository 650 mg  650 mg Rectal Q6H PRN Crosley, Debby, MD       amitriptyline (ELAVIL) tablet 50 mg  50 mg Oral QHS Vann, Jessica U, DO       dexamethasone (DECADRON) tablet 4 mg  4 mg Oral QID Crosley,  Debby, MD   4 mg at 06/17/22 0826   levETIRAcetam (KEPPRA) tablet 1,000 mg  1,000 mg Oral BID Gery Pray, MD   1,000 mg at 06/17/22 0826   LORazepam (ATIVAN) tablet 1 mg  1 mg Oral TID PRN Gery Pray, MD       nicotine (NICODERM CQ - dosed in mg/24 hours) patch 14 mg  14 mg Transdermal Daily Crosley, Debby, MD       ondansetron (ZOFRAN) tablet 4 mg  4 mg Oral Q6H PRN Crosley, Debby, MD       Or   ondansetron (ZOFRAN) injection 4 mg  4 mg Intravenous Q6H PRN Crosley, Debby, MD       oxyCODONE (Oxy IR/ROXICODONE) immediate release tablet 15 mg  15 mg Oral Q4H PRN Marlin Canary U, DO   15 mg at 06/17/22 6045   pantoprazole (PROTONIX) EC tablet 40 mg  40 mg Oral Daily Crosley, Debby, MD   40 mg at 06/17/22 4098   senna (SENOKOT) tablet 8.6 mg  1 tablet  Oral Daily Gery Pray, MD   8.6 mg at 06/17/22 1191    REVIEW OF SYSTEMS:   Constitutional: Denies fevers, chills or abnormal weight loss Eyes: Denies blurriness of vision Ears, nose, mouth, throat, and face: Denies mucositis or sore throat Respiratory: Denies cough, dyspnea or wheezes Cardiovascular: Denies palpitation, chest discomfort or lower extremity swelling Gastrointestinal:  Denies nausea, constipation, diarrhea GU: Denies dysuria or incontinence Skin: Denies abnormal skin rashes Neurological: Per HPI Musculoskeletal: Denies joint pain, back or neck discomfort. No decrease in ROM Behavioral/Psych: Denies anxiety, disturbance in thought content, and mood instability   PHYSICAL EXAMINATION: Vitals:   06/17/22 1340 06/17/22 1345  BP: (!) 160/90 138/89  Pulse: 70 60  Resp: 20 20  Temp: 98.1 F (36.7 C) 98.3 F (36.8 C)  SpO2: 100% 100%   KPS: 60. General: Alert, cooperative, pleasant, in no acute distress Head: Normal EENT: No conjunctival injection or scleral icterus. Oral mucosa moist Lungs: Resp effort normal Cardiac: Regular rate and rhythm Abdomen: Soft, non-distended abdomen Skin: No rashes cyanosis or petechiae. Extremities: No clubbing or edema  NEUROLOGIC EXAM: Mental Status: Awake, alert, attentive to examiner. Oriented to self and environment. Language is fluent with intact comprehension.  Cranial Nerves: Visual acuity is grossly normal. Left hemianopia. Extra-ocular movements intact. No ptosis. Face is symmetric, tongue midline. Motor: Tone and bulk are normal. Left arm and leg 2/5. Sensory: Intact to light touch and temperature Gait: Non ambulatory   LABORATORY DATA:  I have reviewed the data as listed Lab Results  Component Value Date   WBC 5.7 06/17/2022   HGB 11.9 (L) 06/17/2022   HCT 37.2 06/17/2022   MCV 96.6 06/17/2022   PLT 163 06/17/2022   Recent Labs    04/26/22 1440 04/26/22 1448 06/08/22 2121 06/15/22 1715 06/16/22 2001  06/17/22 0531  NA 139   < > 132* 135 138 136  K 3.0*   < > 3.3* 3.5 3.9 4.2  CL 103   < > 96* 101 105 107  CO2 27   < > 26 24 25 24   GLUCOSE 100*   < > 116* 110* 108* 117*  BUN 6   < > <5* 5* 10 7  CREATININE 0.65   < > 0.69 0.63 0.78 0.67  CALCIUM 9.0   < > 9.2 9.0 8.9 8.2*  GFRNONAA >60   < > >60 >60 >60 >60  PROT 7.3  --  6.6 7.3  --   --  ALBUMIN 4.3  --  3.7 4.2  --   --   AST 21  --  20 16  --   --   ALT 15  --  18 18  --   --   ALKPHOS 46  --  44 43  --   --   BILITOT 0.6  --  0.5 0.5  --   --    < > = values in this interval not displayed.    RADIOGRAPHIC STUDIES: I have personally reviewed the radiological images as listed and agreed with the findings in the report. DG CHEST PORT 1 VIEW  Result Date: 06/17/2022 CLINICAL DATA:  Near-syncope. EXAM: PORTABLE CHEST 1 VIEW COMPARISON:  Chest radiographs 02/12/2022 FINDINGS: Cardiac silhouette and mediastinal contours are within normal limits. The lungs are clear. No pleural effusion or pneumothorax. No acute skeletal abnormality. IMPRESSION: No acute cardiopulmonary disease process. Electronically Signed   By: Neita Garnet M.D.   On: 06/17/2022 10:10   CT Head Wo Contrast  Result Date: 06/16/2022 CLINICAL DATA:  Brain/CNS neoplasm. Monitor headache, sudden, severe. CT yesterday but returned with worsening headache. Syncope. EXAM: CT HEAD WITHOUT CONTRAST TECHNIQUE: Contiguous axial images were obtained from the base of the skull through the vertex without intravenous contrast. RADIATION DOSE REDUCTION: This exam was performed according to the departmental dose-optimization program which includes automated exposure control, adjustment of the mA and/or kV according to patient size and/or use of iterative reconstruction technique. 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 The hyperdensity could  represent small volume intratumoral hemorrhage or mineralization and is not substantially changed. There is unchanged effacement of the right lateral ventricle and third ventricle. Stable ventricular caliber of the left lateral ventricle. Unchanged 9 mm of leftward midline shift at the septum pellucidum. Unchanged effacement of the suprasellar cistern. No new hemorrhage or evidence of acute infarct. Vascular: No hyperdense vessel. Skull: No acute fracture.  Right burr holes. Sinuses/Orbits: No acute finding. Other: None. IMPRESSION: 1. No substantial change in the right cerebral hemisphere neoplasm. 2. Unchanged effacement of the right lateral ventricle and third ventricle with 9 mm of leftward midline shift. Stable ventricular caliber of the left lateral ventricle. 3. No new hemorrhage or evidence of acute infarct. Electronically Signed   By: Minerva Fester M.D.   On: 06/16/2022 19:38   CT HEAD WO CONTRAST ( )  Result Date: 06/15/2022 CLINICAL DATA:  Headache.  Known brain tumor.  Prior fall. EXAM: CT HEAD WITHOUT CONTRAST TECHNIQUE: Contiguous axial images were obtained from the base of the skull through the vertex without intravenous contrast. RADIATION DOSE REDUCTION: This exam was performed according to the departmental dose-optimization program which includes automated exposure control, adjustment of the mA and/or kV according to patient size and/or use of iterative reconstruction technique. COMPARISON:  CT head Jun 08, 2022. FINDINGS: Brain: No substantial change in large right cerebral hemisphere neoplasm with heterogeneous soft tissue and intermixed areas of hyperdensity which could represent small volume intratumoral hemorrhage or mineralization. Similar mass effect with approximately 9 mm of midline shift. Effacement of the suprasellar cistern, similar. No new/interval acute hemorrhage or evidence of acute large vascular territory infarct. Vascular: No hyperdense vessel identified. Skull: No acute  fracture.  Burr holes. Sinuses/Orbits: Clear sinuses.  No acute orbital findings. Other: No mastoid effusions. IMPRESSION: No substantial change in large right cerebral hemisphere neoplasm with approximately 9 mm of leftward midline shift,  described above. MRI with contrast could further assess if clinically warranted. Electronically Signed   By: Feliberto Harts M.D.   On: 06/15/2022 19:24   CT Lumbar Spine Wo Contrast  Result Date: 06/09/2022 CLINICAL DATA:  Trauma EXAM: CT LUMBAR SPINE WITHOUT CONTRAST TECHNIQUE: Multidetector CT imaging of the lumbar spine was performed without intravenous contrast administration. Multiplanar CT image reconstructions were also generated. RADIATION DOSE REDUCTION: This exam was performed according to the departmental dose-optimization program which includes automated exposure control, adjustment of the mA and/or kV according to patient size and/or use of iterative reconstruction technique. COMPARISON:  None Available. FINDINGS: Segmentation: 5 lumbar type vertebrae. Alignment: Normal. Vertebrae: No acute fracture or focal pathologic process. Paraspinal and other soft tissues: Negative. Disc levels: Small disc bulge at L5-S1. No stenosis. IMPRESSION: No acute fracture or traumatic subluxation of the lumbar spine. . Electronically Signed   By: Deatra Robinson M.D.   On: 06/09/2022 02:34   CT Head Wo Contrast  Result Date: 06/09/2022 CLINICAL DATA:  Trauma.  Intracranial neoplasm. EXAM: CT HEAD WITHOUT CONTRAST CT CERVICAL SPINE WITHOUT CONTRAST TECHNIQUE: Multidetector CT imaging of the head and cervical spine was performed following the standard protocol without intravenous contrast. Multiplanar CT image reconstructions of the cervical spine were also generated. RADIATION DOSE REDUCTION: This exam was performed according to the departmental dose-optimization program which includes automated exposure control, adjustment of the mA and/or kV according to patient size and/or  use of iterative reconstruction technique. COMPARISON:  05/29/2022 head CT FINDINGS: CT HEAD FINDINGS Brain: Unchanged appearance of the right hemisphere with known neoplasm causing leftward midline shift of 9 mm. Unchanged areas of hyperdensity that may indicate mineralization. No acute hemorrhage or extra-axial collection. Vascular: No abnormal hyperdensity of the major intracranial arteries or dural venous sinuses. No intracranial atherosclerosis. Skull: Remote right burr hole. Sinuses/Orbits: No fluid levels or advanced mucosal thickening of the visualized paranasal sinuses. No mastoid or middle ear effusion. The orbits are normal. CT CERVICAL SPINE FINDINGS Alignment: No static subluxation. Facets are aligned. Occipital condyles are normally positioned. Skull base and vertebrae: No acute fracture. Soft tissues and spinal canal: No prevertebral fluid or swelling. No visible canal hematoma. Disc levels: No advanced spinal canal or neural foraminal stenosis. Upper chest: No pneumothorax, pulmonary nodule or pleural effusion. Other: Normal visualized paraspinal cervical soft tissues. IMPRESSION: 1. Unchanged appearance of the right cerebral hemisphere with known neoplasm causing leftward midline shift of 9 mm. 2. No acute fracture or static subluxation of the cervical spine. Electronically Signed   By: Deatra Robinson M.D.   On: 06/09/2022 00:31   CT Cervical Spine Wo Contrast  Result Date: 06/09/2022 CLINICAL DATA:  Trauma.  Intracranial neoplasm. EXAM: CT HEAD WITHOUT CONTRAST CT CERVICAL SPINE WITHOUT CONTRAST TECHNIQUE: Multidetector CT imaging of the head and cervical spine was performed following the standard protocol without intravenous contrast. Multiplanar CT image reconstructions of the cervical spine were also generated. RADIATION DOSE REDUCTION: This exam was performed according to the departmental dose-optimization program which includes automated exposure control, adjustment of the mA and/or kV  according to patient size and/or use of iterative reconstruction technique. COMPARISON:  05/29/2022 head CT FINDINGS: CT HEAD FINDINGS Brain: Unchanged appearance of the right hemisphere with known neoplasm causing leftward midline shift of 9 mm. Unchanged areas of hyperdensity that may indicate mineralization. No acute hemorrhage or extra-axial collection. Vascular: No abnormal hyperdensity of the major intracranial arteries or dural venous sinuses. No intracranial atherosclerosis. Skull: Remote right burr hole. Sinuses/Orbits:  No fluid levels or advanced mucosal thickening of the visualized paranasal sinuses. No mastoid or middle ear effusion. The orbits are normal. CT CERVICAL SPINE FINDINGS Alignment: No static subluxation. Facets are aligned. Occipital condyles are normally positioned. Skull base and vertebrae: No acute fracture. Soft tissues and spinal canal: No prevertebral fluid or swelling. No visible canal hematoma. Disc levels: No advanced spinal canal or neural foraminal stenosis. Upper chest: No pneumothorax, pulmonary nodule or pleural effusion. Other: Normal visualized paraspinal cervical soft tissues. IMPRESSION: 1. Unchanged appearance of the right cerebral hemisphere with known neoplasm causing leftward midline shift of 9 mm. 2. No acute fracture or static subluxation of the cervical spine. Electronically Signed   By: Deatra Robinson M.D.   On: 06/09/2022 00:31   CT HEAD W & WO CONTRAST ( )  Result Date: 05/29/2022 CLINICAL DATA:  Brain/CNS neoplasm, monitor EXAM: CT HEAD WITHOUT AND WITH CONTRAST TECHNIQUE: Contiguous axial images were obtained from the base of the skull through the vertex without and with intravenous contrast. RADIATION DOSE REDUCTION: This exam was performed according to the departmental dose-optimization program which includes automated exposure control, adjustment of the mA and/or kV according to patient size and/or use of iterative reconstruction technique. CONTRAST:  75mL  OMNIPAQUE IOHEXOL 300 MG/ML  SOLN COMPARISON:  CT March 29, 24. FINDINGS: Brain: Continued findings related to known astrocytoma in the right frontal lobe including masslike hypoattenuation with similar associated mass effect and 9 mm of leftward midline shift. Small area of hyperdensity within the lesion posterolaterally (series 2, image 15) appears new. Outside of this abnormality, no specific evidence of acute large vascular territory infarct. No hydrocephalus. Effacement the right lateral ventricle. Vascular: No hyperdense vessel identified. Calcific atherosclerosis. Skull: Right burr hole.  No acute fracture. Sinuses/Orbits: Clear sinuses.  No acute orbital findings. Other: No mastoid effusions. IMPRESSION: Continued findings related to known astrocytoma in the right frontal lobe with similar mass effect and 9 mm of leftward midline shift. New small area of hyperdensity in this region is suspicious for either a small amount of intralesional hemorrhage or calcification. An MRI with contrast could better assess if clinically warranted. Electronically Signed   By: Feliberto Harts M.D.   On: 05/29/2022 15:07    ASSESSMENT & PLAN:  Astrocytoma Episode of LOC  Doranne Vanschoyck presents with ongoing clinical decline secondary to progressive and refractory IDH mutant astrocytoma.    Admitting chief complaint is most likely from focal seizure.  Syncope considered less likely.   We had an additional goals of care discussion today, we did again recommend hospice given poor prognosis and lack of meaningful life prolonging interventions.  Tibsovo has been formally discontinued.  She has not yet consented for return to home hospice.  In meantime, we have an MRI scheduled under general anesthesia for next month, and clinic follow up.  Would advise outpatient palliative care with Dr. Valerie Roys for further management of pain and goals of care.  For discharge, decadron can continue at 4mg  daily.   Keppra may be increased to 1500mg  BID.   All questions were answered. The patient knows to call the clinic with any problems, questions or concerns.  The total time spent in the encounter was 60 minutes and more than 50% was on counseling and review of test results     Henreitta Leber, MD 06/17/2022 3:03 PM

## 2022-06-17 NOTE — Progress Notes (Signed)
PROGRESS NOTE    Paula Farmer  ZOX:[id number] DOB: 11-11-1980 DOA: 06/16/2022 PCP: Center, [facility]    Brief Narrative:  42 year old female with a past medical history of recurrent brain tumor / Anaplastic astrocytoma.  Astrocytoma initially diagnosed in 2018.  She was treated and went into remission.  The patient presents after near syncopal event.  Patient states she was dizzy prior to passing out.  She endorses a mild headache and photophobia.  Family called 911, she was brought to the ER.  Per patient she was feeling weak all day.  The house was hot and she laid on the couch all day.  She got up, felt lightheaded and almost passed out.  This has occurred to the patient twice before.  States patient states she fell last week once and hit her right side.  She dropped her pill bottle at that time.  Her daughter swept her medications up and threw them away.  Patient states she has been out of her lorazepam, Tylenol, and ibuprofen.  She has been suffering with pain but unable to purchase new medications due to lack of funds.  She states since her tumor has returned, she has lost some upper extremity strength, initially in the left arm, now in both arms.  Per ER doc: Discussed with Dr. Barbaraann Cao. We both concur that there is no hard-core reason for admission but she is got a lot of social problems she is missing appointments. She is end-stage astrocytoma. Would best benefit from palliative care he is recommending to have her admitted to the medicine service have him continue her Decadron continue IV pain medicine and he will see her in the morning also recommending for palliative care consult.   Assessment and Plan:   Near syncopal event -DDx: Brain tumor/orthostatic/known midline shift w/ swelling/CVA/hemorrhage -Orthostatic vitals ordered, IV fluid hydration -EDP contacted patient's oncologist Dr. Barbaraann Cao last night.  He has been unsuccessfully trying to see patient since she  left hospice.  This has been unsuccessful due to myriad of issues including transportation.  He would like patient admitted, he will see patient in the AM.  He will broach discussion re: palliative care. -denies dysuria, chest x ray looks clear   Cancer associated pain/Anaplastic astrocytoma (HCC) -Dr. Barbaraann Cao to see patient in AM.   -palliative care consult -Continue Decadron twice daily -Pain control   Focal seizures -Continue Keppra twice daily   Tobacco use -Nicotine patch  DVT prophylaxis: Place and maintain sequential compression device Start: 06/17/22 0047    Code Status: DNR  Disposition Plan:  Level of care: Med-Surg Status is: Observation     Consultants:  Vaslow Palliative care   Subjective: C/o headache  Objective: Vitals:   06/16/22 2325 06/16/22 2347 06/17/22 0412 06/17/22 0749  BP: 121/82 118/79 123/79 136/80  Pulse: 69 65 71 65  Resp: 17 18 18 20   Temp:  98.3 F (36.8 C) 98.2 F (36.8 C) 98.2 F (36.8 C)  TempSrc:  Oral Oral Oral  SpO2: 98% 99% 99% 100%  Weight:  68.1 kg    Height:  5' 5.98" (1.676 m)      Intake/Output Summary (Last 24 hours) at 06/17/2022 1008 Last data filed at 06/17/2022 8119 Gross per 24 hour  Intake 240 ml  Output 600 ml  Net -360 ml   Filed Weights   06/16/22 2347  Weight: 68.1 kg    Examination:   General: Appearance:    Older than stated age female in no acute  distress     Lungs:      respirations unlabored  Heart:    Normal heart rate. Normal rhythm. No murmurs, rubs, or gallops.    MS:   All extremities are intact.    Neurologic:   Awake, alert       Data Reviewed: I have personally reviewed following labs and imaging studies  CBC: Recent Labs  Lab 06/15/22 1715 06/16/22 2001 06/17/22 0531  WBC 6.3 7.7 5.7  NEUTROABS 5.3 5.9 4.7  HGB 14.6 13.2 11.9*  HCT 45.3 41.0 37.2  MCV 93.6 95.6 96.6  PLT 193 170 163   Basic Metabolic Panel: Recent Labs  Lab 06/15/22 1715 06/16/22 2001  06/17/22 0531  NA 135 138 136  K 3.5 3.9 4.2  CL 101 105 107  CO2 24 25 24   GLUCOSE 110* 108* 117*  BUN 5* 10 7  CREATININE 0.63 0.78 0.67  CALCIUM 9.0 8.9 8.2*   GFR: Estimated Creatinine Clearance: 87.5 mL/min (by C-G formula based on SCr of 0.67 mg/dL). Liver Function Tests: Recent Labs  Lab 06/15/22 1715  AST 16  ALT 18  ALKPHOS 43  BILITOT 0.5  PROT 7.3  ALBUMIN 4.2   No results for input(s): "LIPASE", "AMYLASE" in the last 168 hours. No results for input(s): "AMMONIA" in the last 168 hours. Coagulation Profile: No results for input(s): "INR", "PROTIME" in the last 168 hours. Cardiac Enzymes: No results for input(s): "CKTOTAL", "CKMB", "CKMBINDEX", "TROPONINI" in the last 168 hours. BNP (last 3 results) No results for input(s): "PROBNP" in the last 8760 hours. HbA1C: No results for input(s): "HGBA1C" in the last 72 hours. CBG: Recent Labs  Lab 06/16/22 1811  GLUCAP 106*   Lipid Profile: No results for input(s): "CHOL", "HDL", "LDLCALC", "TRIG", "CHOLHDL", "LDLDIRECT" in the last 72 hours. Thyroid Function Tests: No results for input(s): "TSH", "T4TOTAL", "FREET4", "T3FREE", "THYROIDAB" in the last 72 hours. Anemia Panel: No results for input(s): "VITAMINB12", "FOLATE", "FERRITIN", "TIBC", "IRON", "RETICCTPCT" in the last 72 hours. Sepsis Labs: No results for input(s): "PROCALCITON", "LATICACIDVEN" in the last 168 hours.  No results found for this or any previous visit (from the past 240 hour(s)).       Radiology Studies: CT Head Wo Contrast  Result Date: 06/16/2022 CLINICAL DATA:  Brain/CNS neoplasm. Monitor headache, sudden, severe. CT yesterday but returned with worsening headache. Syncope. EXAM: CT HEAD WITHOUT CONTRAST TECHNIQUE: Contiguous axial images were obtained from the base of the skull through the vertex without intravenous contrast. RADIATION DOSE REDUCTION: This exam was performed according to the departmental dose-optimization program  which includes automated exposure control, adjustment of the mA and/or kV according to patient size and/or use of iterative reconstruction technique. 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 The hyperdensity could represent small volume intratumoral hemorrhage or mineralization and is not substantially changed. There is unchanged effacement of the right lateral ventricle and third ventricle. Stable ventricular caliber of the left lateral ventricle. Unchanged 9 mm of leftward midline shift at the septum pellucidum. Unchanged effacement of the suprasellar cistern. No new hemorrhage or evidence of acute infarct. Vascular: No hyperdense vessel. Skull: No acute fracture.  Right burr holes. Sinuses/Orbits: No acute finding. Other: None. IMPRESSION: 1. No substantial change in the right cerebral hemisphere neoplasm. 2. Unchanged effacement of the right lateral ventricle and third ventricle with 9 mm of leftward midline shift. Stable ventricular caliber of the left lateral  ventricle. 3. No new hemorrhage or evidence of acute infarct. Electronically Signed   By: Minerva Fester M.D.   On: 06/16/2022 19:38   CT HEAD WO CONTRAST ( )  Result Date: 06/15/2022 CLINICAL DATA:  Headache.  Known brain tumor.  Prior fall. EXAM: CT HEAD WITHOUT CONTRAST TECHNIQUE: Contiguous axial images were obtained from the base of the skull through the vertex without intravenous contrast. RADIATION DOSE REDUCTION: This exam was performed according to the departmental dose-optimization program which includes automated exposure control, adjustment of the mA and/or kV according to patient size and/or use of iterative reconstruction technique. COMPARISON:  CT head Jun 08, 2022. FINDINGS: Brain: No substantial change in large right cerebral hemisphere neoplasm with heterogeneous soft tissue and intermixed areas  of hyperdensity which could represent small volume intratumoral hemorrhage or mineralization. Similar mass effect with approximately 9 mm of midline shift. Effacement of the suprasellar cistern, similar. No new/interval acute hemorrhage or evidence of acute large vascular territory infarct. Vascular: No hyperdense vessel identified. Skull: No acute fracture.  Burr holes. Sinuses/Orbits: Clear sinuses.  No acute orbital findings. Other: No mastoid effusions. IMPRESSION: No substantial change in large right cerebral hemisphere neoplasm with approximately 9 mm of leftward midline shift, described above. MRI with contrast could further assess if clinically warranted. Electronically Signed   By: Feliberto Harts M.D.   On: 06/15/2022 19:24        Scheduled Meds:  amitriptyline  50 mg Oral QHS   dexamethasone  4 mg Oral QID   ivosidenib  500 mg Oral Daily   levETIRAcetam  1,000 mg Oral BID   nicotine  14 mg Transdermal Daily   pantoprazole  40 mg Oral Daily   senna  1 tablet Oral Daily   Continuous Infusions:  sodium chloride 75 mL/hr at 06/16/22 2359     LOS: 0 days    Time spent: 45 minutes spent on chart review, discussion with nursing staff, consultants, updating family and interview/physical exam; more than 50% of that time was spent in counseling and/or coordination of care.    Joseph Art, DO [facility] Available via Epic secure chat 7am-7pm After these hours, please refer to coverage provider listed on [email or web address] 06/17/2022, 10:08 AM

## 2022-06-17 NOTE — Consult Note (Signed)
Consultation Note Date: 06/17/2022   Patient Name: Paula Farmer  DOB: 14-Nov-1980  MRN: [id number]  Age / Sex: 42 y.o., female  PCP: Center, [facility] Referring Physician: Joseph Art, DO  Reason for Consultation: Establishing goals of care  HPI/Patient Profile: 42 y.o. female admitted on 06/16/2022 with  .   Clinical Assessment and Goals of Care: 42 year old lady with life limiting illness of recurrent brain tumor-anaplastic astrocytoma, seen by palliative care in recent previous hospitalization, presents after near syncopal event dizziness prior to passing out accompanied with headache and photophobia.  Family called 911. Patient has end-stage astrocytoma.  Been admitted to hospital medicine service.  During previous hospitalization, discussions with regards to differences between hospice and palliative services were explored.  Advance care planning was done.  Healthcare power of attorney was elected to be patient's sister Ms. Clovis Riley. Continues to have ongoing progressive cognitive and physical decline. Palliative care consulted. Is awake alert resting in bed is able to follow simple commands.  States that she is doing okay.  Used myself and palliative care as follows: Palliative medicine is specialized medical care for people living with serious illness. It focuses on providing relief from the symptoms and stress of a serious illness. The goal is to improve quality of life for both the patient and the family. Goals of care: Broad aims of medical therapy in relation to the patient's values and preferences. Our aim is to provide medical care aimed at enabling patients to achieve the goals that matter most to them, given the circumstances of their particular medical situation and their constraints.    HCPOA Sister Ms. Marquitta Clovis Riley  SUMMARY OF RECOMMENDATIONS   Agree with  DNR Recommend hospice services.  Patient does not seem amenable to discussions today, at the time of this initial palliative consultation.  She has met my colleague Ms. Mims previously as well.  Will request follow-up from palliative with Dr. Patterson Hammersmith on 06-18-2022.  Call placed, unable to reach Sister HCPOA at this time. Continue current mode of care for now  Code Status/Advance Care Planning: DNR   Symptom Management:     Palliative Prophylaxis:  Delirium Protocol  Additional Recommendations (Limitations, Scope, Preferences): Full Scope Treatment  Psycho-social/Spiritual:  Desire for further Chaplaincy support:yes Additional Recommendations: Education on Hospice  Prognosis:  < 6 months  Discharge Planning: To Be Determined      Primary Diagnoses: Present on Admission:  Cancer associated pain  Anaplastic astrocytoma (HCC)  Brain tumor (HCC)   I have reviewed the medical record, interviewed the patient and family, and examined the patient. The following aspects are pertinent.  Past Medical History:  Diagnosis Date   Cancer (HCC)    Seizures (HCC)    Social History   Socioeconomic History   Marital status: Single    Spouse name: Not on file   Number of children: Not on file   Years of education: Not on file   Highest education level: Not on file  Occupational History   Not on  file  Tobacco Use   Smoking status: Some Days    Packs/day: 0.25    Years: 10.00    Additional pack years: 0.00    Total pack years: 2.50    Types: Cigarettes   Smokeless tobacco: Never  Vaping Use   Vaping Use: Never used  Substance and Sexual Activity   Alcohol use: Not Currently    Alcohol/week: 2.0 standard drinks of alcohol    Types: 2 Glasses of wine per week    Comment: drinks wine twice a month   Drug use: Not Currently   Sexual activity: Not Currently  Other Topics Concern   Not on file  Social History Narrative   Not on file   Social Determinants of Health    Financial Resource Strain: Not on file  Food Insecurity: Food Insecurity Present (06/17/2022)   Hunger Vital Sign    Worried About Running Out of Food in the Last Year: Sometimes true    Ran Out of Food in the Last Year: Sometimes true  Transportation Needs: Unmet Transportation Needs (06/17/2022)   PRAPARE - Administrator, Civil Service (Medical): Yes    Lack of Transportation (Non-Medical): Yes  Physical Activity: Not on file  Stress: Not on file  Social Connections: Not on file   Family History  Problem Relation Age of Onset   Cancer Mother    Scheduled Meds:  amitriptyline  50 mg Oral QHS   dexamethasone  4 mg Oral QID   ivosidenib  500 mg Oral Daily   levETIRAcetam  1,000 mg Oral BID   nicotine  14 mg Transdermal Daily   pantoprazole  40 mg Oral Daily   senna  1 tablet Oral Daily   Continuous Infusions:  sodium chloride 75 mL/hr at 06/16/22 2359   PRN Meds:.acetaminophen **OR** acetaminophen, LORazepam, ondansetron **OR** ondansetron (ZOFRAN) IV, oxyCODONE Medications Prior to Admission:  Prior to Admission medications   Medication Sig Start Date End Date Taking? Authorizing Provider  amitriptyline (ELAVIL) 50 MG tablet TAKE 1 TABLET BY MOUTH EVERYDAY AT BEDTIME Patient taking differently: Take 50 mg by mouth at bedtime. 03/28/22  Yes Vaslow, Georgeanna Lea, MD  dexamethasone (DECADRON) 4 MG tablet Take 1 tablet (4 mg total) by mouth 4 (four) times daily. 04/30/22 06/29/22 Yes [place], Alvira Philips, DO  HYDROcodone-acetaminophen (NORCO) 10-325 MG tablet Take 1 tablet by mouth every 6 (six) hours as needed for up to 2 days for severe pain. 06/15/22 06/17/22  Smitty Knudsen, PA-C  ivosidenib (TIBSOVO) 250 MG tablet Take 2 tablets (500 mg total) by mouth daily. Avoid taking with a high-fat meal. 03/28/22   Vaslow, Georgeanna Lea, MD  levETIRAcetam (KEPPRA) 1000 MG tablet Take 1 tablet (1,000 mg total) by mouth 2 (two) times daily. 04/30/22 07/29/22  [place], Eric J, DO  LORazepam (ATIVAN)  1 MG tablet Take 1 tablet (1 mg total) by mouth 3 (three) times daily as needed for anxiety. 04/30/22   [place], Alvira Philips, DO  methocarbamol (ROBAXIN) 500 MG tablet Take 500 mg by mouth 3 (three) times daily as needed for muscle spasms. 04/26/21   [provider]  Multiple Vitamins-Minerals (HM MULTIVITAMIN ADULT GUMMY PO) Take 2 tablets by mouth daily.    [provider]  omeprazole (PRILOSEC) 20 MG capsule Take 20 mg by mouth daily. 04/19/21   [provider]  ondansetron (ZOFRAN) 4 MG tablet Take 1 tablet (4 mg total) by mouth daily as needed for nausea or vomiting. 04/30/22 04/30/23  [place], Alvira Philips, DO  oxyCODONE (ROXICODONE) 15 MG immediate release tablet Take 1 tablet (15 mg total) by mouth every 4 (four) hours as needed for moderate pain, severe pain or breakthrough pain. 04/30/22   [place], Eric J, DO  polyethylene glycol (MIRALAX / GLYCOLAX) 17 g packet Take 17 g by mouth daily. 04/30/22 07/29/22  [place], Alvira Philips, DO  potassium chloride SA (KLOR-CON M) 20 MEQ tablet Take 20 mEq by mouth 2 (two) times daily.    [provider]  senna (SENOKOT) 8.6 MG TABS tablet Take 1 tablet (8.6 mg total) by mouth daily. 04/30/22 07/29/22  [place], Eric J, DO   No Known Allergies Review of Systems + generalized weakness Physical Exam Weak appearing lady resting in bed Able to follow simple commands Verbalizes some Regular work of breathing Appears with generalized weakness  Vital Signs: BP 136/80 (BP Location: Right Arm)   Pulse 65   Temp 98.2 F (36.8 C) (Oral)   Resp 20   Ht 5' 5.98" (1.676 m)   Wt 68.1 kg   SpO2 100%   BMI 24.24 kg/m  Pain Scale: 0-10   Pain Score: 10-Worst pain ever   SpO2: SpO2: 100 % O2 Device:SpO2: 100 % O2 Flow Rate: .   IO: Intake/output summary:  Intake/Output Summary (Last 24 hours) at 06/17/2022 1220 Last data filed at 06/17/2022 5409 Gross per 24 hour  Intake 240 ml  Output 600 ml  Net -360 ml    LBM: Last BM Date :  06/16/22 Baseline Weight: Weight: 68.1 kg Most recent weight: Weight: 68.1 kg     Palliative Assessment/Data:   PPS 40%  Time In:  11.30 Time Out:  12.30 Time Total:  60  Greater than 50%  of this time was spent counseling and coordinating care related to the above assessment and plan.  Signed by: Rosalin Hawking, MD   Please contact Palliative Medicine Team phone at [phone] for questions and concerns.  For individual provider: See Loretha Stapler

## 2022-06-17 NOTE — Discharge Instructions (Signed)
Transportation Services through your [facility] Coverage  Emergency: If you need emergency transportation (an ambulance), call 911.  Non-emergency: [employer] [place] can arrange and pay for your transportation to help you get to and from your appointments for Medicaid-covered care. This service is free to you. If you need an attendant to go with you to your doctors appointment, or if your child (17 years old or younger) is a member of the plan, transportation is also covered for the attendant, parent or guardian. Non-emergency transportation includes personal vehicles, taxis, vans, mini-buses, mountain area transports and public transportation.  How to get non-emergency transportation. Call [phone] up to 2 days before your appointment to arrange transportation to and from your appointment. Non-emergency transportation services via personal vehicles, taxis, vans, mini-buses, mountain area transports and public transportation are provided through our contracted transportation [employer]. You can contact [employer] to request or cancel a trip by calling Member Services at [phone] and choosing the option for Transportation. Rides for routine appointments should be scheduled at least 2 business days in advance by calling between 7:00 a.m. and 6:00 p.m., Monday through Saturday. Rides for urgent appointments can also be made after hours by calling Member Services.  Responsibilities of the member:  To use those transportation resources which are available and appropriate to their needs in the most efficient and effective manner. To utilize transportation services, such as gas vouchers, appropriately. To travel to the requested location and receive a Medicaid covered service. To make timely requests for transportation assistance. To be ready and at the designated place for transportation pick-up or cancel the transportation request timely. To follow the  instructions of the driver. To respect and not violate the rights of other passengers and the driver, such as not creating a disturbance or engaging in threatening language or behavior. Member no-shows: A no-show is when the member does not go to the medical appointment.  The member must be ready and at the designated place for pick-up at the time required by the transportation vendor. The member must complete their trip and show evidence in order to be issued reimbursement for their mileage. The member must call the number provided for trip requests to cancel scheduled transportation at least 24 hours in advance. Members who miss three or more trips in a three-month period, or who risk the safety of other passengers or the driver may be suspended from transportation services for up to 30 days. You can get additional information on our Non-Emergency Medical Transportation policy by calling Member Services at [phone]

## 2022-06-17 NOTE — Progress Notes (Signed)
Chaplain acknowledges consult for support.  In consultation with palliative care provider, chaplain will wait until Tuesday to see [facility].  If there are urgent needs, however, please page [place] at [phone].

## 2022-06-17 NOTE — TOC Initial Note (Signed)
Transition of Care [facility]) - Initial/Assessment Note    Patient Details  Name: Paula Farmer MRN: [id number] Date of Birth: 01-30-1980  Transition of Care [facility]) CM/SW Contact:    Elliot Gault, LCSW Phone Number: 06/17/2022, 9:03 AM  Clinical Narrative:                  Pt admitted from home. Palliative Care consult pending.  Received TOC consult for Medication Assistance/HH/DME. Pt also has SDOH barriers identified.  Reviewed pt's record. Pt has [facility] Medicaid. PT consult/recommendations not in place. SDOH concerns referred to [facility] and Medicaid transportation instructions placed on pt's AVS. Due to pt having medication coverage with her Medicaid, there are no other assistance resources that she is eligible for.  TOC will follow and further assess and assist with dc planning.  Expected Discharge Plan: Home/Self Care Barriers to Discharge: Continued Medical Work up   Patient Goals and CMS Choice            Expected Discharge Plan and Services       Living arrangements for the past 2 months: Apartment                                      Prior Living Arrangements/Services Living arrangements for the past 2 months: Apartment   Patient language and need for interpreter reviewed:: Yes        Need for Family Participation in Patient Care: No (Comment)     Criminal Activity/Legal Involvement Pertinent to Current Situation/Hospitalization: No - Comment as needed  Activities of Daily Living Home Assistive Devices/Equipment: Cane (specify quad or straight) ADL Screening (condition at time of admission) Patient's cognitive ability adequate to safely complete daily activities?: Yes Is the patient deaf or have difficulty hearing?: No Does the patient have difficulty seeing, even when wearing glasses/contacts?: Yes Does the patient have difficulty concentrating, remembering, or making decisions?: No Patient able to express need for assistance with ADLs?:  No Does the patient have difficulty dressing or bathing?: No Independently performs ADLs?: Yes (appropriate for developmental age) Communication: Independent Dressing (OT): Independent, Needs assistance Is this a change from baseline?: Pre-admission baseline Grooming: Independent Feeding: Independent Bathing: Needs assistance Is this a change from baseline?: Pre-admission baseline Toileting: Needs assistance Is this a change from baseline?: Pre-admission baseline In/Out Bed: Needs assistance Is this a change from baseline?: Pre-admission baseline Walks in Home: Needs assistance Is this a change from baseline?: Pre-admission baseline Does the patient have difficulty walking or climbing stairs?: Yes Weakness of Legs: Both Weakness of Arms/Hands: Left  Permission Sought/Granted                  Emotional Assessment       Orientation: : Oriented to Place, Oriented to Self, Oriented to  Time, Oriented to Situation Alcohol / Substance Use: Not Applicable Psych Involvement: No (comment)  Admission diagnosis:  Brain mass [G93.89] 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[facility]) [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] Patient Active Problem List   Diagnosis Date Noted   Palliative care encounter 04/29/2022   Goals of care, counseling/discussion 04/29/2022   Counseling and coordination of care 04/29/2022   High risk medication use 04/29/2022   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   Left-sided weakness 04/29/2022   Brain tumor (HCC) 04/27/2022   Left hemiparesis (HCC) 04/26/2022   History of seizure 04/26/2022   Hypokalemia 04/26/2022   Focal seizures (HCC) 02/21/2022   Vasogenic edema (HCC)  Headache 07/17/2020   Intractable headache 07/17/2020   GAD (generalized anxiety disorder) 11/20/2018   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 [place] [address] Phone: [phone] Fax: [phone]     Social Determinants of Health (SDOH) Social History: SDOH Screenings   Food Insecurity: Food Insecurity Present (06/17/2022)  Housing: Patient Declined (06/17/2022)  Transportation Needs: Unmet Transportation Needs (06/17/2022)  Utilities: At Risk (06/17/2022)  Tobacco Use: High Risk (06/16/2022)   SDOH Interventions: Food Insecurity Interventions: [id number] Referral Housing Interventions: [id number] Referral Transportation Interventions: Inpatient TOC Utilities Interventions: [id number] Referral   Readmission Risk Interventions    04/28/2022    2:48 PM  Readmission Risk Prevention Plan  Post Dischage Appt Complete  Medication Screening Complete  Transportation Screening Complete

## 2022-06-18 DIAGNOSIS — Z5982 Transportation insecurity: Secondary | ICD-10-CM | POA: Diagnosis not present

## 2022-06-18 DIAGNOSIS — D496 Neoplasm of unspecified behavior of brain: Secondary | ICD-10-CM | POA: Diagnosis not present

## 2022-06-18 DIAGNOSIS — F17210 Nicotine dependence, cigarettes, uncomplicated: Secondary | ICD-10-CM | POA: Diagnosis present

## 2022-06-18 DIAGNOSIS — C719 Malignant neoplasm of brain, unspecified: Secondary | ICD-10-CM | POA: Diagnosis not present

## 2022-06-18 DIAGNOSIS — H547 Unspecified visual loss: Secondary | ICD-10-CM | POA: Diagnosis present

## 2022-06-18 DIAGNOSIS — Z66 Do not resuscitate: Secondary | ICD-10-CM | POA: Diagnosis present

## 2022-06-18 DIAGNOSIS — R569 Unspecified convulsions: Secondary | ICD-10-CM

## 2022-06-18 DIAGNOSIS — G9389 Other specified disorders of brain: Secondary | ICD-10-CM | POA: Diagnosis not present

## 2022-06-18 DIAGNOSIS — R55 Syncope and collapse: Secondary | ICD-10-CM | POA: Diagnosis present

## 2022-06-18 DIAGNOSIS — B370 Candidal stomatitis: Secondary | ICD-10-CM | POA: Diagnosis present

## 2022-06-18 DIAGNOSIS — R4589 Other symptoms and signs involving emotional state: Secondary | ICD-10-CM

## 2022-06-18 DIAGNOSIS — M542 Cervicalgia: Secondary | ICD-10-CM

## 2022-06-18 DIAGNOSIS — Z515 Encounter for palliative care: Secondary | ICD-10-CM

## 2022-06-18 DIAGNOSIS — Z85841 Personal history of malignant neoplasm of brain: Secondary | ICD-10-CM | POA: Diagnosis not present

## 2022-06-18 DIAGNOSIS — G8194 Hemiplegia, unspecified affecting left nondominant side: Secondary | ICD-10-CM | POA: Diagnosis present

## 2022-06-18 DIAGNOSIS — Z5941 Food insecurity: Secondary | ICD-10-CM | POA: Diagnosis not present

## 2022-06-18 DIAGNOSIS — Z79899 Other long term (current) drug therapy: Secondary | ICD-10-CM | POA: Diagnosis not present

## 2022-06-18 DIAGNOSIS — I10 Essential (primary) hypertension: Secondary | ICD-10-CM | POA: Diagnosis present

## 2022-06-18 DIAGNOSIS — Z7189 Other specified counseling: Secondary | ICD-10-CM

## 2022-06-18 DIAGNOSIS — R519 Headache, unspecified: Secondary | ICD-10-CM | POA: Diagnosis present

## 2022-06-18 DIAGNOSIS — Z9181 History of falling: Secondary | ICD-10-CM | POA: Diagnosis not present

## 2022-06-18 DIAGNOSIS — G893 Neoplasm related pain (acute) (chronic): Secondary | ICD-10-CM | POA: Diagnosis not present

## 2022-06-18 DIAGNOSIS — H53462 Homonymous bilateral field defects, left side: Secondary | ICD-10-CM | POA: Diagnosis present

## 2022-06-18 MED ORDER — LIDOCAINE 4 % EX CREA
TOPICAL_CREAM | Freq: Four times a day (QID) | CUTANEOUS | Status: DC | PRN
  Filled 2022-06-18: qty 5

## 2022-06-18 NOTE — Progress Notes (Signed)
Paula Farmer was engaging in therapy at time of visit. Chaplain will follow-up.     06/18/22 1000  Spiritual Encounters  Type of Visit Initial  Care provided to: Pt not available

## 2022-06-18 NOTE — Progress Notes (Signed)
Chaplain engaged in an initial visit with Paula Farmer. She shared about how she ended up in the hospital. Her son and dear friend found her unconscious. Her friend immediately began to administer CPR measures as he had seen on a television show before. They were able to get her emergent care by calling 911.   Chaplain talked with Paula Farmer about her need for additional support at home on a daily basis. While her friend is available to help at night, he and her children work or go to school throughout the day. She does have one daughter, one of her older children, who is normally at home but does not support Paula Farmer in the ways she needs. She expressed that there has been a lot of stress and contention around this daughter so much so that she has wanted to get her out of her home. Chaplain talked with Paula Farmer about boundary setting. She noted she has told this daughter to leave before but she will not go anywhere. Chaplain assessed that Paula Farmer has felt a great deal of pressure and stress from her home life. She continuously voiced that this daughter in particular was taking time away from her in reference to living.   Paula Farmer also voiced her desire to continue living because all she has known is working, struggling, and taking care of children. She wants to finally live her life. Chaplain affirmed that goal for her while also highlighting the reality of her health. Paula Farmer talked about how new all of these changes are for her. She has lost a great deal of her independence and ability to function on her own. Chaplain offered reflective listening, compassionate presence, and support.   At this time, Paula Farmer's lights and water are off. She has been the one primarily used to paying bills but does have two working children in her home. She voiced that she has to remind them to help. Chaplain assessed that there are layers of family dynamics happening with a lot of grief. She would like for a  physician or someone on the care team to submit a letter to [facility] on her behalf. She voiced that there is a program that will allow her lights and water to stay on.   Paula Farmer, her friend, would also like to know what it would cost to pay out of pocket for Paula Farmer to go to a facility. He recognizes that Paula Farmer should not be alone.     06/18/22 1400  Spiritual Encounters  Type of Visit Follow up  Care provided to: Patient  Reason for visit Routine spiritual support  Spiritual Framework  Presenting Themes Community and relationships;Caregiving needs;Significant life change;Impactful experiences and emotions  Needs/Challenges/Barriers Help and support  Patient Stress Factors Exhausted;Health changes;Family relationships  Interventions  Spiritual Care Interventions Made Supported grief process;Narrative/life review;Reflective listening;Compassionate presence;Established relationship of care and support;Normalization of emotions;Explored values/beliefs/practices/strengths  Intervention Outcomes  Outcomes Awareness of support;Connection to spiritual care;Awareness around self/spiritual resourses  Spiritual Care Plan  Spiritual Care Issues Still Outstanding Chaplain will continue to follow  Recommendations for Clinical Staff Awareness of insurance

## 2022-06-18 NOTE — Progress Notes (Signed)
PROGRESS NOTE    Paula Farmer  WGN:[id number] DOB: 1980/04/16 DOA: 06/16/2022 PCP: [facility]    Brief Narrative:  42 year old female with a past medical history of recurrent brain tumor / Anaplastic astrocytoma.  Astrocytoma initially diagnosed in 2018.  She was treated and went into remission.  The patient presents after near syncopal event.  Patient states she was dizzy prior to passing out.  She endorses a mild headache and photophobia.  Family called 911, she was brought to the ER.  Per patient she was feeling weak all day.  The house was hot and she laid on the couch all day.  She got up, felt lightheaded and almost passed out.  This has occurred to the patient twice before.  States patient states she fell last week once and hit her right side.  She dropped her pill bottle at that time.  Her daughter swept her medications up and threw them away.  Patient states she has been out of her lorazepam, Tylenol, and ibuprofen.  She has been suffering with pain but unable to purchase new medications due to lack of funds.  She states since her tumor has returned, she has lost some upper extremity strength, initially in the left arm, now in both arms.  Per ER doc: Discussed with Dr. Barbaraann Cao. We both concur that there is no hard-core reason for admission but she is got a lot of social problems she is missing appointments. She is end-stage astrocytoma. Would best benefit from palliative care he is recommending to have her admitted to the medicine service have him continue her Decadron continue IV pain medicine and he will see her in the morning also recommending for palliative care consult.   Assessment and Plan:   Near syncopal event -DDx: Brain tumor/orthostatic/known midline shift w/ swelling/CVA/hemorrhage -Dr. Barbaraann Cao: Admitting chief complaint is most likely from focal seizure.  Syncope considered less likely.  We had an additional goals of care discussion today, we did  again recommend hospice given poor prognosis and lack of meaningful life prolonging interventions.  Tibsovo has been formally discontinued.  She has not yet consented for return to home hospice. In meantime, we have an MRI scheduled under general anesthesia for next month, and clinic follow up. Would advise outpatient palliative care with Dr. Valerie Roys for further management of pain and goals of care. For discharge, decadron can continue at 4mg  daily.  Keppra may be increased to 1500mg  BID.  -denies dysuria, chest x ray looks clear   Cancer associated pain/Anaplastic astrocytoma (HCC) -palliative care consult -Continue Decadron twice daily -Pain control   Focal seizures -Continue Keppra twice daily at increased dose   Tobacco use -Nicotine patch  DVT prophylaxis: Place and maintain sequential compression device Start: 06/17/22 0047    Code Status: DNR  Disposition Plan:  Level of care: Med-Surg Status is: Observation-- need safe d/c plan-- palliative to meet with patient and hospice today     Consultants:  [facility]   Subjective: Still c/o headache  Objective: Vitals:   06/17/22 1340 06/17/22 1345 06/17/22 2131 06/18/22 0458  BP: (!) 160/90 138/89 (!) 141/95 (!) 143/83  Pulse: 70 60 75 67  Resp: 20 20 18    Temp: 98.1 F (36.7 C) 98.3 F (36.8 C) 98.2 F (36.8 C) 98.1 F (36.7 C)  TempSrc: Oral Oral Axillary Oral  SpO2: 100% 100% 100% 100%  Weight:      Height:        Intake/Output Summary (Last  24 hours) at 06/18/2022 1006 Last data filed at 06/18/2022 0900 Gross per 24 hour  Intake 2028.76 ml  Output --  Net 2028.76 ml   Filed Weights   06/16/22 2347  Weight: 68.1 kg    Examination:   General: Appearance:    Older than stated age female in no acute distress     Lungs:      respirations unlabored  Heart:    Normal heart rate. Normal rhythm. No murmurs, rubs, or gallops.    MS:   All extremities are intact.    Neurologic:    Awake, alert, left sided weakness       Data Reviewed: I have personally reviewed following labs and imaging studies  CBC: Recent Labs  Lab 06/15/22 1715 06/16/22 2001 06/17/22 0531  WBC 6.3 7.7 5.7  NEUTROABS 5.3 5.9 4.7  HGB 14.6 13.2 11.9*  HCT 45.3 41.0 37.2  MCV 93.6 95.6 96.6  PLT 193 170 163   Basic Metabolic Panel: Recent Labs  Lab 06/15/22 1715 06/16/22 2001 06/17/22 0531  NA 135 138 136  K 3.5 3.9 4.2  CL 101 105 107  CO2 24 25 24   GLUCOSE 110* 108* 117*  BUN 5* 10 7  CREATININE 0.63 0.78 0.67  CALCIUM 9.0 8.9 8.2*   GFR: Estimated Creatinine Clearance: 87.5 mL/min (by C-G formula based on SCr of 0.67 mg/dL). Liver Function Tests: Recent Labs  Lab 06/15/22 1715  AST 16  ALT 18  ALKPHOS 43  BILITOT 0.5  PROT 7.3  ALBUMIN 4.2   No results for input(s): "LIPASE", "AMYLASE" in the last 168 hours. No results for input(s): "AMMONIA" in the last 168 hours. Coagulation Profile: No results for input(s): "INR", "PROTIME" in the last 168 hours. Cardiac Enzymes: No results for input(s): "CKTOTAL", "CKMB", "CKMBINDEX", "TROPONINI" in the last 168 hours. BNP (last 3 results) No results for input(s): "PROBNP" in the last 8760 hours. HbA1C: No results for input(s): "HGBA1C" in the last 72 hours. CBG: Recent Labs  Lab 06/16/22 1811  GLUCAP 106*   Lipid Profile: No results for input(s): "CHOL", "HDL", "LDLCALC", "TRIG", "CHOLHDL", "LDLDIRECT" in the last 72 hours. Thyroid Function Tests: No results for input(s): "TSH", "T4TOTAL", "FREET4", "T3FREE", "THYROIDAB" in the last 72 hours. Anemia Panel: No results for input(s): "VITAMINB12", "FOLATE", "FERRITIN", "TIBC", "IRON", "RETICCTPCT" in the last 72 hours. Sepsis Labs: No results for input(s): "PROCALCITON", "LATICACIDVEN" in the last 168 hours.  No results found for this or any previous visit (from the past 240 hour(s)).       Radiology Studies: DG CHEST PORT 1 VIEW  Result Date:  06/17/2022 CLINICAL DATA:  Near-syncope. EXAM: PORTABLE CHEST 1 VIEW COMPARISON:  Chest radiographs 02/12/2022 FINDINGS: Cardiac silhouette and mediastinal contours are within normal limits. The lungs are clear. No pleural effusion or pneumothorax. No acute skeletal abnormality. IMPRESSION: No acute cardiopulmonary disease process. Electronically Signed   By: Neita Garnet M.D.   On: 06/17/2022 10:10   CT Head Wo Contrast  Result Date: 06/16/2022 CLINICAL DATA:  Brain/CNS neoplasm. Monitor headache, sudden, severe. CT yesterday but returned with worsening headache. Syncope. EXAM: CT HEAD WITHOUT CONTRAST TECHNIQUE: Contiguous axial images were obtained from the base of the skull through the vertex without intravenous contrast. RADIATION DOSE REDUCTION: This exam was performed according to the departmental dose-optimization program which includes automated exposure control, adjustment of the mA and/or kV according to patient size and/or use of iterative reconstruction technique. 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 The hyperdensity could represent small volume intratumoral hemorrhage or mineralization and is not substantially changed. There is unchanged effacement of the right lateral ventricle and third ventricle. Stable ventricular caliber of the left lateral ventricle. Unchanged 9 mm of leftward midline shift at the septum pellucidum. Unchanged effacement of the suprasellar cistern. No new hemorrhage or evidence of acute infarct. Vascular: No hyperdense vessel. Skull: No acute fracture.  Right burr holes. Sinuses/Orbits: No acute finding. Other: None. IMPRESSION: 1. No substantial change in the right cerebral hemisphere neoplasm. 2. Unchanged effacement of the right lateral ventricle and third ventricle with 9 mm of leftward midline shift. Stable ventricular caliber of  the left lateral ventricle. 3. No new hemorrhage or evidence of acute infarct. Electronically Signed   By: Minerva Fester M.D.   On: 06/16/2022 19:38        Scheduled Meds:  amitriptyline  50 mg Oral QHS   dexamethasone  4 mg Oral QID   levETIRAcetam  1,500 mg Oral BID   nicotine  14 mg Transdermal Daily   pantoprazole  40 mg Oral Daily   senna  1 tablet Oral Daily   Continuous Infusions:  sodium chloride 75 mL/hr at 06/18/22 0159     LOS: 0 days    Time spent: 45 minutes spent on chart review, discussion with nursing staff, consultants, updating family and interview/physical exam; more than 50% of that time was spent in counseling and/or coordination of care.    Joseph Art, DO [facility] Available via Epic secure chat 7am-7pm After these hours, please refer to coverage provider listed on [email or web address] 06/18/2022, 10:06 AM
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
## 2022-06-18 NOTE — Evaluation (Signed)
Physical Therapy Evaluation Patient Details Name: Paula Farmer MRN: [id number] DOB: Jun 25, 1980 Today's Date: 06/18/2022  History of Present Illness  42 year old female with a past medical history of recurrent brain tumor / Anaplastic astrocytoma, seizures,   Astrocytoma initially diagnosed in 2018.  She was treated and went into remission.  The patient presents after near syncopal event.     Clinical Impression    The [patient presents with limited mobility, requiring max support for transfers to recliner. Patient not focused and required frequent redirection to stay on task.  Patient  able to bear some weight through LLE, LUE with limited movement, increased tone noted.  Unsure of disposition, patient requires 24/7 for safety with transfers, unsure if she has a WC, which is recommended.  Pt admitted with above diagnosis.  Pt currently with functional limitations due to the deficits listed below (see PT Problem List). Pt will benefit from acute skilled PT to increase their independence and safety with mobility to allow discharge.      Recommendations for follow up therapy are one component of a multi-disciplinary discharge planning process, led by the attending physician.  Recommendations may be updated based on patient status, additional functional criteria and insurance authorization.  Follow Up Recommendations       Assistance Recommended at Discharge Frequent or constant Supervision/Assistance  Patient can return home with the following  A lot of help with walking and/or transfers;A little help with bathing/dressing/bathroom;Assist for transportation;Help with stairs or ramp for entrance    Equipment Recommendations None recommended by PT  Recommendations for Other Services       Functional Status Assessment Patient has had a recent decline in their functional status and demonstrates the ability to make significant improvements in function in a reasonable and predictable  amount of time.     Precautions / Restrictions Precautions Precautions: Fall Precaution Comments: left hemiparesis Restrictions Weight Bearing Restrictions: No      Mobility  Bed Mobility Overal bed mobility: Needs Assistance Bed Mobility: Supine to Sit     Supine to sit: Min assist     General bed mobility comments: patient required min assistance to situp, assisted with LLE patient requires ferquent redirections.    Transfers Overall transfer level: Needs assistance Equipment used: Rolling walker (2 wheels) Transfers: Sit to/from Stand, Bed to chair/wheelchair/BSC Sit to Stand: +2 safety/equipment, +2 physical assistance, Mod assist   Step pivot transfers: +2 safety/equipment, +2 physical assistance, Max assist       General transfer comment: patient stood at [facility] with RUE,  patient did not amintain support on Rw, Patient assisted to step the left leg to the left to trun to sit down into recliner.    Ambulation/Gait                  Stairs            Wheelchair Mobility    Modified Rankin (Stroke Patients Only)       Balance Overall balance assessment: Needs assistance, History of Falls Sitting-balance support: Single extremity supported, Feet supported Sitting balance-Leahy Scale: Fair     Standing balance support: Single extremity supported, During functional activity, Reliant on assistive device for balance Standing balance-Leahy Scale: Poor                               Pertinent Vitals/Pain Pain Assessment Pain Assessment: No/denies pain    Home Living Family/patient expects to be discharged to::  Private residence Living Arrangements: Children;Non-relatives/Friends Available Help at Discharge: Family;Friend(s);Available 24 hours/day Type of Home: Apartment Home Access: Stairs to enter Entrance Stairs-Rails: Right Entrance Stairs-Number of Steps: flight   Home Layout: One level Home Equipment: Cane - single  Librarian, academic (2 wheels)      Prior Function               Mobility Comments: unsure, patient reports ambulating, unsure of reliability. ADLs Comments: assisted by family     Hand Dominance   Dominant Hand: Right    Extremity/Trunk Assessment   Upper Extremity Assessment Upper Extremity Assessment: LUE deficits/detail LUE Deficits / Details: fingers flexed, d lacks elbow extension ROM, no functional movement    Lower Extremity Assessment Lower Extremity Assessment: LLE deficits/detail LLE Deficits / Details: grossly able to move the  leg, able to bear weight with buckling. difficult to get info related to sensation LLE Coordination: decreased fine motor    Cervical / Trunk Assessment Cervical / Trunk Assessment: Normal  Communication   Communication: Expressive difficulties  Cognition Arousal/Alertness: Awake/alert Behavior During Therapy: Restless, Anxious Overall Cognitive Status: No family/caregiver present to determine baseline cognitive functioning Area of Impairment: Orientation, Attention, Following commands, Safety/judgement                 Orientation Level: Situation Current Attention Level: Focused   Following Commands: Follows one step commands inconsistently Safety/Judgement: Decreased awareness of deficits     General Comments: oriented to date,  patient required redirection multiple times, focused on her phone,        General Comments      Exercises     Assessment/Plan    PT Assessment Patient needs continued PT services  PT Problem List Decreased mobility;Decreased cognition;Decreased range of motion;Decreased balance;Decreased knowledge of precautions;Decreased knowledge of use of DME;Impaired tone       PT Treatment Interventions DME instruction;Neuromuscular re-education;Therapeutic exercise;Functional mobility training;Gait training;Cognitive remediation;Patient/family education    PT Goals (Current goals can be found  in the Care Plan section)  Acute Rehab PT Goals PT Goal Formulation: Patient unable to participate in goal setting Time For Goal Achievement: 07/02/22 Potential to Achieve Goals: Fair    Frequency Min 1X/week     Co-evaluation               AM-PAC PT "6 Clicks" Mobility  Outcome Measure Help needed turning from your back to your side while in a flat bed without using bedrails?: A Lot Help needed moving from lying on your back to sitting on the side of a flat bed without using bedrails?: A Lot Help needed moving to and from a bed to a chair (including a wheelchair)?: Total Help needed standing up from a chair using your arms (e.g., wheelchair or bedside chair)?: Total Help needed to walk in hospital room?: Total Help needed climbing 3-5 steps with a railing? : Total 6 Click Score: 8    End of Session Equipment Utilized During Treatment: Gait belt Activity Tolerance: Patient tolerated treatment well Patient left: in chair;with nursing/sitter in room Nurse Communication: Mobility status PT Visit Diagnosis: Unsteadiness on feet (R26.81);History of falling (Z91.81);Difficulty in walking, not elsewhere classified (R26.2);Other symptoms and signs involving the nervous system (R29.898)    Time: 8295-6213 PT Time Calculation (min) (ACUTE ONLY): 21 min   Charges:   PT Evaluation $PT Eval Low Complexity: 1 Low          Blanchard Kelch PT Acute Rehabilitation Services Office [phone] Weekend [phone]   [place],  Jobe Igo 06/18/2022, 2:05 PM

## 2022-06-18 NOTE — TOC Progression Note (Signed)
Transition of Care [facility]) - Progression Note    Patient Details  Name: Paula Farmer MRN: [id number] Date of Birth: 1980-07-23  Transition of Care [facility]) CM/SW Contact  Beckie Busing, RN Phone [phone]  06/18/2022, 3:13 PM  Clinical Narrative:    [facility] acknowledges Hospice consult with patient choice already selected for [facility] [place]. [facility] hospital liasion was added to the secure chat and acknowledges the consult. TOC will continue to follow for disposition planning.    Expected Discharge Plan: Home/Self Care Barriers to Discharge: Continued Medical Work up  Expected Discharge Plan and Services       Living arrangements for the past 2 months: Apartment                                       Social Determinants of Health (SDOH) Interventions SDOH Screenings   Food Insecurity: Food Insecurity Present (06/17/2022)  Housing: Patient Declined (06/17/2022)  Transportation Needs: Unmet Transportation Needs (06/17/2022)  Utilities: At Risk (06/17/2022)  Tobacco Use: High Risk (06/16/2022)    Readmission Risk Interventions    04/28/2022    2:48 PM  Readmission Risk Prevention Plan  Post Dischage Appt Complete  Medication Screening Complete  Transportation Screening Complete

## 2022-06-18 NOTE — Progress Notes (Signed)
   This pt was our pt for less than 24 hours in April 2024. She revoked the services to follow up with her oncologist seeking more chemotherapy.   She is most certainty appropriate for hospice care no doubts as long as the goals are comfort focused and not returning to hospital. However, we do recommend 24/7 care at home. We would recommend that if an event happens at home like it did prior to this hospitalization then the pt go to our hospice facility and receive end of life care with no more hospitalizations.   I will plan to meet with the pt and Ron hopefully tomorrow to assist with plans.   Cheir Kyung Rudd RN [phone]

## 2022-06-19 DIAGNOSIS — Z515 Encounter for palliative care: Secondary | ICD-10-CM | POA: Diagnosis not present

## 2022-06-19 DIAGNOSIS — D496 Neoplasm of unspecified behavior of brain: Secondary | ICD-10-CM | POA: Diagnosis not present

## 2022-06-19 DIAGNOSIS — C719 Malignant neoplasm of brain, unspecified: Secondary | ICD-10-CM | POA: Diagnosis not present

## 2022-06-19 DIAGNOSIS — G9389 Other specified disorders of brain: Secondary | ICD-10-CM | POA: Diagnosis not present

## 2022-06-19 DIAGNOSIS — Z79899 Other long term (current) drug therapy: Secondary | ICD-10-CM

## 2022-06-19 DIAGNOSIS — G893 Neoplasm related pain (acute) (chronic): Secondary | ICD-10-CM | POA: Diagnosis not present

## 2022-06-19 LAB — TROPONIN I (HIGH SENSITIVITY): Troponin I (High Sensitivity): 3 ng/L (ref <18)

## 2022-06-19 MED ORDER — OXYCODONE HCL ER 10 MG PO T12A
10.0000 mg | EXTENDED_RELEASE_TABLET | Freq: Two times a day (BID) | ORAL | Status: DC
  Administered 2022-06-19 – 2022-06-27 (×16): 10 mg via ORAL
  Filled 2022-06-19 (×17): qty 1

## 2022-06-19 MED ORDER — HYDROMORPHONE HCL 1 MG/ML IJ SOLN
1.0000 mg | INTRAMUSCULAR | Status: DC | PRN
  Administered 2022-06-19 – 2022-06-26 (×5): 1 mg via INTRAVENOUS
  Filled 2022-06-19 (×5): qty 1

## 2022-06-19 MED ORDER — MELATONIN 3 MG PO TABS
3.0000 mg | ORAL_TABLET | Freq: Every day | ORAL | Status: DC
  Administered 2022-06-19 – 2022-06-26 (×8): 3 mg via ORAL
  Filled 2022-06-19 (×9): qty 1

## 2022-06-19 MED ORDER — HYDRALAZINE HCL 20 MG/ML IJ SOLN: 5.0000 mg | INTRAMUSCULAR | Status: DC | PRN

## 2022-06-19 NOTE — Progress Notes (Signed)
Daily Progress Note   Patient Name: Paula Farmer       Date: 06/19/2022 DOB: November 15, 1980  Age: 42 y.o. MRN#: [id number] Attending Physician: Joseph Art, DO Primary Care Physician: Center, [facility] Medical Admit Date: 06/16/2022 Length of Stay: 1 day  Reason for Consultation/Follow-up: Establishing goals of care and symptom management  Subjective:   CC: Patient notes worsening back and neck pain.  Following up regarding complex medical decision making and symptom management.  Subjective:  Reviewed EMR prior to presenting to bedside.  In past 24 hours at time of EMR review patient has required oxycodone 15 mg x 4 doses.  Presented to bedside in afternoon to check on patient.  Patient laying in bed eating hamburger.  No family present at bedside when visiting.  Increased weakness noted in extremities while attempting to eat.  Patient notes worsening pain in her neck, back, and head.  Patient notes she called an old high school friend today.  Patient noted "I told her I am still here".   Patient notes that while she is still trying to enjoy things, every day is harder because she is becoming more forgetful and confused.Empathized with difficult situation and offered emotional support via active listening.  We again discussed patient focusing on quality time moving forward knowing she has a cancer that cannot be managed.  Discussed adjusting pain medications to assist with this.  Will start long-acting oral medication twice daily.  Patient also notes she has had positive response to IV medications before for rapid pain management.  Noted we will also start IV pain medications while adjusting orals.  Patient agreeing with plan.   Noted palliative medicine team would continue to follow along with patient's medical journey.  All questions answered at that time.  Thanked patient for allowing me to visit with her today.  Review of Systems Pain not currently controlled Objective:    Vital Signs:  BP (!) 141/87 (BP Location: Right Arm)   Pulse 71   Temp 97.8 F (36.6 C) (Oral)   Resp 20   Ht 5' 5.98" (1.676 m)   Wt 68.1 kg   SpO2 100%   BMI 24.24 kg/m   Physical Exam: General: NAD, awake, interactive though becoming more confused, chronically ill-appearing Eyes: No drainage noted HENT: moist mucous membranes Cardiovascular: RRR Respiratory: no increased work of breathing noted, not in respiratory distress Abdomen: not distended Extremities: Weakness noted in all extremities Skin: no rashes or lesions on visible skin Neuro: Interactive though can become confused at times  Imaging:  I personally reviewed recent imaging.   Assessment & Plan:   Assessment: Patient is a 42 year old female with past medical history of recurrent anaplastic astrocytoma who was admitted on 06/16/2022 after near syncopal event at home. Patient is well-known to the palliative medicine service as has been seen during prior hospitalizations. Palliative medicine team consulted to assist with complex medical decision making and symptom management.   Recommendations/Plan: # Complex medical decision making/goals of care:   - Patient is not a candidate for any further cancer directed therapies as per oncology here and oncology at [facility] had been recommended by both facilities.  Focusing on patient's symptom management and quality of life at this time knowing her overall prognosis is limited.             -  Code Status: DNR Prognosis: < 6 months   # Symptom management:                -  Neck/Back pain with headache, is setting of recurrent anaplastic astrocytoma                              -Continue topical lidocaine cream    -Start OxyContin 10mg  q12hrs tonight   -Continue oxycodone 15mg  q4hrs prn   -Start IV dilaudid 1mg  q2hrs prn for breakthrough pain   # Psychosocial Support:                - Ron-friend, children, sister                - chaplain involved for support    # Discharge Planning: TBD. Care team along with [facility] [place] liaison working to determine where patient can receive 24/7 support for care with hospice as support at home is limited.    Discussed with: Hospitalist, RN, [facility], patient   Thank you for allowing the palliative care team to participate in the care [name].  Alvester Morin, DO Palliative Care Provider PMT # [phone]  If patient remains symptomatic despite maximum doses, please call PMT at [phone] between 0700 and 1900. Outside of these hours, please call attending, as PMT does not have night coverage.  *Please note that this is a verbal dictation therefore any spelling or grammatical errors are due to the "Dragon Medical One" system interpretation.

## 2022-06-19 NOTE — TOC Progression Note (Signed)
Transition of Care [facility]) - Progression Note    Patient Details  Name: Paula Farmer MRN: [id number] Date of Birth: 07-14-1980  Transition of Care [facility]) CM/SW Contact  Beckie Busing, RN Phone Number:[phone]  06/19/2022, 3:25 PM  Clinical Narrative:    TOC following up with patient for disposition planning. Patient states that she plans to return home where her children and friend Ron will care for her. Patient confirms that she has caregivers that come for 3-4 hours daily 7 days per week.   Expected Discharge Plan: Home/Self Care Barriers to Discharge: Continued Medical Work up  Expected Discharge Plan and Services       Living arrangements for the past 2 months: Apartment                                       Social Determinants of Health (SDOH) Interventions SDOH Screenings   Food Insecurity: Food Insecurity Present (06/17/2022)  Housing: Patient Declined (06/17/2022)  Transportation Needs: Unmet Transportation Needs (06/17/2022)  Utilities: At Risk (06/17/2022)  Tobacco Use: High Risk (06/16/2022)    Readmission Risk Interventions    04/28/2022    2:48 PM  Readmission Risk Prevention Plan  Post Dischage Appt Complete  Medication Screening Complete  Transportation Screening Complete

## 2022-06-19 NOTE — Progress Notes (Signed)
PROGRESS NOTE    Paula Farmer  ZOX:[id number] DOB: Apr 15, 1980 DOA: 06/16/2022 PCP: Center, [facility]    Brief Narrative:  42 year old female with a past medical history of recurrent brain tumor / Anaplastic astrocytoma.  Astrocytoma initially diagnosed in 2018.   The patient presents after near syncopal event.  Patient states she was dizzy prior to passing out.  She endorses a mild headache and photophobia.  Family called 911, she was brought to the ER.  No further options for treating cancer.  Needs 24/7 supervision at home which family is working on arranging.  Assessment and Plan:   Near syncopal event-- probably seizure related -Dr. Barbaraann Cao: Admitting chief complaint is most likely from focal seizure.  Syncope considered less likely.  We had an additional goals of care discussion today, we did again recommend hospice given poor prognosis and lack of meaningful life prolonging interventions.  Tibsovo has been formally discontinued.  She has not yet consented for return to home hospice. In meantime, we have an MRI scheduled under general anesthesia for next month, and clinic follow up. For discharge, decadron can continue at 4mg  daily.  Keppra may be increased to 1500mg  BID.    Cancer associated pain/Anaplastic astrocytoma (HCC) -palliative care consult -Continue Decadron -Pain control   Focal seizures -Continue Keppra twice daily at increased dose   Tobacco use -Nicotine patch  DVT prophylaxis: Place and maintain sequential compression device Start: 06/17/22 0047    Code Status: DNR  Disposition Plan:  Level of care: Med-Surg Status is: Inpt     Consultants:  [facility]   Subjective: Says her vision is blurry  Objective: Vitals:   06/18/22 0458 06/18/22 1306 06/18/22 2012 06/19/22 0521  BP: (!) 143/83 (!) 146/85 (!) 142/81 (!) 177/94  Pulse: 67 69 74 (!) 54  Resp:  18 18 16   Temp: 98.1 F (36.7 C) (!) 97.5 F (36.4 C) 98.3  F (36.8 C) 98.2 F (36.8 C)  TempSrc: Oral Oral Oral Oral  SpO2: 100% 100% 100% 100%  Weight:      Height:        Intake/Output Summary (Last 24 hours) at 06/19/2022 0746 Last data filed at 06/19/2022 8119 Gross per 24 hour  Intake 3559.63 ml  Output 1550 ml  Net 2009.63 ml   Filed Weights   06/16/22 2347  Weight: 68.1 kg    Examination:   General: Appearance:    Well developed, well nourished female in no acute distress     Lungs:     respirations unlabored  Heart:    Bradycardic.   MS:   All extremities are intact.   Neurologic:   Awake, alert       Data Reviewed: I have personally reviewed following labs and imaging studies  CBC: Recent Labs  Lab 06/15/22 1715 06/16/22 2001 06/17/22 0531  WBC 6.3 7.7 5.7  NEUTROABS 5.3 5.9 4.7  HGB 14.6 13.2 11.9*  HCT 45.3 41.0 37.2  MCV 93.6 95.6 96.6  PLT 193 170 163   Basic Metabolic Panel: Recent Labs  Lab 06/15/22 1715 06/16/22 2001 06/17/22 0531  NA 135 138 136  K 3.5 3.9 4.2  CL 101 105 107  CO2 24 25 24   GLUCOSE 110* 108* 117*  BUN 5* 10 7  CREATININE 0.63 0.78 0.67  CALCIUM 9.0 8.9 8.2*   GFR: Estimated Creatinine Clearance: 87.5 mL/min (by C-G formula based on SCr of 0.67 mg/dL). Liver Function Tests: Recent Labs  Lab 06/15/22 1715  AST 16  ALT 18  ALKPHOS 43  BILITOT 0.5  PROT 7.3  ALBUMIN 4.2   No results for input(s): "LIPASE", "AMYLASE" in the last 168 hours. No results for input(s): "AMMONIA" in the last 168 hours. Coagulation Profile: No results for input(s): "INR", "PROTIME" in the last 168 hours. Cardiac Enzymes: No results for input(s): "CKTOTAL", "CKMB", "CKMBINDEX", "TROPONINI" in the last 168 hours. BNP (last 3 results) No results for input(s): "PROBNP" in the last 8760 hours. HbA1C: No results for input(s): "HGBA1C" in the last 72 hours. CBG: Recent Labs  Lab 06/16/22 1811  GLUCAP 106*   Lipid Profile: No results for input(s): "CHOL", "HDL", "LDLCALC", "TRIG",  "CHOLHDL", "LDLDIRECT" in the last 72 hours. Thyroid Function Tests: No results for input(s): "TSH", "T4TOTAL", "FREET4", "T3FREE", "THYROIDAB" in the last 72 hours. Anemia Panel: No results for input(s): "VITAMINB12", "FOLATE", "FERRITIN", "TIBC", "IRON", "RETICCTPCT" in the last 72 hours. Sepsis Labs: No results for input(s): "PROCALCITON", "LATICACIDVEN" in the last 168 hours.  No results found for this or any previous visit (from the past 240 hour(s)).       Radiology Studies: No results found.      Scheduled Meds:  amitriptyline  50 mg Oral QHS   dexamethasone  4 mg Oral QID   levETIRAcetam  1,500 mg Oral BID   nicotine  14 mg Transdermal Daily   pantoprazole  40 mg Oral Daily   senna  1 tablet Oral Daily   Continuous Infusions:     LOS: 1 day    Time spent: 45 minutes spent on chart review, discussion with nursing staff, consultants, updating family and interview/physical exam; more than 50% of that time was spent in counseling and/or coordination of care.    Joseph Art, DO [facility] Available via Epic secure chat 7am-7pm After these hours, please refer to coverage provider listed on [email or web address] 06/19/2022, 7:46 AM

## 2022-06-19 NOTE — Progress Notes (Signed)
[facility]  I have met with pt at bedside.She is eating a cheeseburger. She reports that she remembers have hospice care briefly in past but not long enough to remember what we do. We had discussion about hospice services at home. She wants to go home. I have discussed that we feel she need 24/7 care givers and she stated they are working on this. She reports her kids and friend Ron are able to care for her at home. She reports being in agreement with hospice care and the philosophy. She reports wanting to move forward with hospice care. She has given me permission to speak with Ron over phone. She thought he was suppose to be her to meet with me. I will update Ron and report back. Norm Parcel RN [phone]

## 2022-06-20 DIAGNOSIS — G893 Neoplasm related pain (acute) (chronic): Secondary | ICD-10-CM | POA: Diagnosis not present

## 2022-06-20 DIAGNOSIS — C719 Malignant neoplasm of brain, unspecified: Secondary | ICD-10-CM | POA: Diagnosis not present

## 2022-06-20 DIAGNOSIS — Z66 Do not resuscitate: Secondary | ICD-10-CM | POA: Diagnosis not present

## 2022-06-20 DIAGNOSIS — G9389 Other specified disorders of brain: Secondary | ICD-10-CM | POA: Diagnosis not present

## 2022-06-20 DIAGNOSIS — Z515 Encounter for palliative care: Secondary | ICD-10-CM | POA: Diagnosis not present

## 2022-06-20 MED ORDER — PHENOL 1.4 % MT LIQD
1.0000 | OROMUCOSAL | Status: DC | PRN
  Administered 2022-06-20: 1 via OROMUCOSAL
  Filled 2022-06-20: qty 177

## 2022-06-20 MED ORDER — AMLODIPINE BESYLATE 5 MG PO TABS
5.0000 mg | ORAL_TABLET | Freq: Every day | ORAL | Status: DC
  Administered 2022-06-20 – 2022-06-27 (×8): 5 mg via ORAL
  Filled 2022-06-20 (×8): qty 1

## 2022-06-20 MED ORDER — POLYETHYLENE GLYCOL 3350 17 G PO PACK
17.0000 g | PACK | Freq: Every day | ORAL | Status: DC
  Administered 2022-06-20 – 2022-06-27 (×8): 17 g via ORAL
  Filled 2022-06-20 (×8): qty 1

## 2022-06-20 NOTE — Progress Notes (Signed)
Physical Therapy Treatment Patient Details Name: Paula Farmer MRN: [id number] DOB: Dec 22, 1980 Today's Date: 06/20/2022   History of Present Illness 42 year old female with history of anaplastic astrocytoma with progression, initially diagnosed in 2018 ,who presented with syncopal event from home on 06/16/22.    PT Comments    Pt presents with left sided hemiparesis and appears to have left visual deficits and/or neglect.  Pt assisted with transferring to right stronger side for OOB to recliner.  Pt does not appear safe to return home without assist and also lives in second floor apartment, so continue to recommend post acute rehab upon d/c.     Recommendations for follow up therapy are one component of a multi-disciplinary discharge planning process, led by the attending physician.  Recommendations may be updated based on patient status, additional functional criteria and insurance authorization.  Follow Up Recommendations       Assistance Recommended at Discharge Frequent or constant Supervision/Assistance  Patient can return home with the following A lot of help with walking and/or transfers;A little help with bathing/dressing/bathroom;Assist for transportation;Help with stairs or ramp for entrance   Equipment Recommendations  None recommended by PT    Recommendations for Other Services       Precautions / Restrictions Precautions Precautions: Fall Precaution Comments: left hemiparesis, left visual deficits     Mobility  Bed Mobility Overal bed mobility: Needs Assistance Bed Mobility: Supine to Sit     Supine to sit: Min assist     General bed mobility comments: assist for left LE, pt exited on right side of bed    Transfers Overall transfer level: Needs assistance Equipment used: 2 person hand held assist Transfers: Sit to/from Stand, Bed to chair/wheelchair/BSC Sit to Stand: +2 safety/equipment, +2 physical assistance, Mod assist   Step pivot transfers:  +2 safety/equipment, +2 physical assistance, Mod assist       General transfer comment: left forearm support provided, L LE buckles, cues for safety, assist for stability; tranfers to pt's right side (for safety)    Ambulation/Gait                   Stairs             Wheelchair Mobility    Modified Rankin (Stroke Patients Only)       Balance Overall balance assessment: Needs assistance, History of Falls         Standing balance support: Single extremity supported, During functional activity Standing balance-Leahy Scale: Poor                              Cognition Arousal/Alertness: Awake/alert Behavior During Therapy: Anxious Overall Cognitive Status: No family/caregiver present to determine baseline cognitive functioning Area of Impairment: Following commands, Safety/judgement                       Following Commands: Follows one step commands inconsistently Safety/Judgement: Decreased awareness of deficits, Decreased awareness of safety     General Comments: focused on lunch        Exercises      General Comments        Pertinent Vitals/Pain Pain Assessment Pain Assessment: Faces Faces Pain Scale: Hurts even more Pain Location: left foot Pain Intervention(s): Repositioned (foot was inverted against foot board)    Home Living  Prior Function            PT Goals (current goals can now be found in the care plan section) Progress towards PT goals: Progressing toward goals    Frequency    Min 1X/week      PT Plan Current plan remains appropriate    Co-evaluation              AM-PAC PT "6 Clicks" Mobility   Outcome Measure  Help needed turning from your back to your side while in a flat bed without using bedrails?: A Lot Help needed moving from lying on your back to sitting on the side of a flat bed without using bedrails?: A Lot Help needed moving to and from a  bed to a chair (including a wheelchair)?: Total Help needed standing up from a chair using your arms (e.g., wheelchair or bedside chair)?: Total Help needed to walk in hospital room?: Total Help needed climbing 3-5 steps with a railing? : Total 6 Click Score: 8    End of Session Equipment Utilized During Treatment: Gait belt Activity Tolerance: Patient tolerated treatment well Patient left: in chair;with chair alarm set;with call bell/phone within reach (floor mat placed) Nurse Communication: Mobility status;Need for lift equipment (stedy for return to bed) PT Visit Diagnosis: Unsteadiness on feet (R26.81);History of falling (Z91.81);Difficulty in walking, not elsewhere classified (R26.2);Other symptoms and signs involving the nervous system (R29.898)     Time: 1610-9604 PT Time Calculation (min) (ACUTE ONLY): 17 min  Charges:  $Therapeutic Activity: 8-22 mins                    Paulino Door, DPT Physical Therapist Acute Rehabilitation Services Office: [phone]    Paula Farmer [place] 06/20/2022, 4:26 PM

## 2022-06-20 NOTE — Progress Notes (Signed)
Daily Progress Note   Patient Name: Paula Farmer       Date: 06/20/2022 DOB: 12-Jun-1980  Age: 42 y.o. MRN#: [id number] Attending Physician: Burnadette Pop, MD Primary Care Physician: Center, [facility] Admit Date: 06/16/2022 Length of Stay: 2 days  Reason for Consultation/Follow-up: Establishing goals of care and symptom management  Subjective:   CC: Patient becoming more confused with time due to her underlying disease process. Following up regarding complex medical decision making and symptom management.   Subjective:  Reviewed EMR prior to presenting to bedside.  At time of EMR review, patient had received oxycodone 15 mg x 2 doses as well as IV Dilaudid 1 mg x 1 dose.  Patient was started on OxyContin 10 mg every 12 hours yesterday evening.  Discussed care with hospitalist prior to seeing patient.  Tentative plan is for patient to go home with hospice support tomorrow as long as family can provide 24/7 support.  Presented to bedside to see patient this afternoon.  Patient laying comfortably in bed.  Patient able to engage in conversation though clear that she is becoming more confused with her underlying disease process.  Patient can even admit she knows she is getting more confused along with having increased weakness and loss of muscle control.  Again reviewed focusing on patient's symptom management for quality time at the end of life.  Reviewed patient's symptoms at this time.  Patient still has back and neck pain though has needed less short acting medication since starting long-acting medication yesterday.  Patient did not want to change her current doses of medication and respected this.  Noted if patient feels medication needs to be adjusted to please let her care team know so can follow-up on this.  Spent time providing emotional support via active listening.  Patient became hyperfocused on how her medications were given to her.  Empathized with her difficult  situation and the lack of control.  All questions answered at that time.  Noted palliative medicine team to continue to follow along with patient's care.  Updated care team after visit.  Review of Systems Pain not currently controlled Objective:   Vital Signs:  BP (!) 153/95 (BP Location: Right Arm)   Pulse 65   Temp 97.9 F (36.6 C) (Oral)   Resp 16   Ht 5' 5.98" (1.676 m)   Wt 68.1 kg   SpO2 100%   BMI 24.24 kg/m   Physical Exam: General: NAD, sleepy, interactive though continues to become more confused, chronically ill-appearing Eyes: No drainage noted HENT: moist mucous membranes Cardiovascular: RRR Respiratory: no increased work of breathing noted, not in respiratory distress Abdomen: not distended Extremities: Weakness noted in all extremities Skin: no rashes or lesions on visible skin Neuro: increased confusion with underlying disease process  Imaging:  I personally reviewed recent imaging.   Assessment & Plan:   Assessment: Patient is a 42 year old female with past medical history of recurrent anaplastic astrocytoma who was admitted on 06/16/2022 after near syncopal event at home. Patient is well-known to the palliative medicine service as has been seen during prior hospitalizations. Palliative medicine team consulted to assist with complex medical decision making and symptom management.   Recommendations/Plan: # Complex medical decision making/goals of care:   - Patient is not a candidate for any further cancer directed therapies as per oncology here and oncology at [facility].  Patient has agreed with pursuing comfort focused care and returning home with hospice support. Care team continuing to make sure  24/7 support can be provided at home. Appreciate TOC's assistance with coordination.             -  Code Status: DNR Prognosis: weeks-months   # Symptom management:                -Neck/Back pain with headache, is setting of recurrent anaplastic astrocytoma                               -Continue topical lidocaine cream    -Continue OxyContin 10mg  q12hrs tonight   -Continue oxycodone 15mg  q4hrs prn   -Continue IV dilaudid 1mg  q2hrs prn for breakthrough pain   # Psychosocial Support:                - Ron-friend, children, sister                - chaplain involved for support   # Discharge Planning: TBD. Care team along with [facility] liaison working to determine where patient can receive 24/7 support for care with hospice.   Discussed with: Hospitalist, RN, [facility], patient   Thank you for allowing the palliative care team to participate in the care [name].  Alvester Morin, DO Palliative Care Provider PMT # [phone]  If patient remains symptomatic despite maximum doses, please call PMT at [phone] between 0700 and 1900. Outside of these hours, please call attending, as PMT does not have night coverage.  *Please note that this is a verbal dictation therefore any spelling or grammatical errors are due to the "Dragon Medical One" system interpretation.

## 2022-06-20 NOTE — Progress Notes (Addendum)
Patient was found sitting down, declines any pain, no lacerations noted, patient is intermediately confused d/t brain tumor and says she remembers trying to take off her depend and slid down on the floor in sitting position. MD Amrit notified via message and VS updated in chart. No wounds or lacerations noted. Patient in bed, bed at lowest position, call bell within reach, floor mat implemented, high fall risk, flowsheet, documentation, safety portal, and notification filled out.  Was the fall witnessed: No  Patient condition before and after the fall: Flaccid on Left side  Patient's reaction to the fall: found sitting on the floor and declines any pain at this time, no bruising or lacerations found.   Name of the doctor that was notified including date and time: Amrit, MD 1200  Any interventions and vital signs: VS documented, pt received IV pain meds  1400-Patient confused and forgetful repeating that RN did not administer PRN medications after giving PO OxyContin @0942  and IV Dilaudid at 1206 and Nurse Lawerance Sabal present as witness that medications were provided to patient.

## 2022-06-20 NOTE — Progress Notes (Signed)
Chaplain engage in a follow-up visit with Paula Farmer. She continues to express how new everything feels. This is the first time she has not been able to do things for herself. Chaplain upheld and affirmed the newness of what she is going through. Chaplain also worked to [employer] feel as heard and comfortable as possible. Chaplain helped with positioning [name] crackers and drink so that it was accessible to her to eat.   Chaplain continues to offer support, presence, and compassion.   06/20/22 1000  Spiritual Encounters  Type of Visit Follow up  Care provided to: Patient

## 2022-06-20 NOTE — Progress Notes (Addendum)
PROGRESS NOTE  Paula Farmer  ZOX:[id number] DOB: 11-13-1980 DOA: 06/16/2022 PCP: [facility], [facility]   Brief Narrative: Patient is a 42 year old unfortunate  female with history of anaplastic astrocytoma with progression, initially diagnosed in 2018 ,who presented with syncopal event from home.  Also reported headache and photophobia.  Remains weak on the left side, impaired vision on the left side as well.  Neuro-oncology was consulted after admission and was following.  She was declared to have progressive and refractory astrocytoma.  Her syncopal episode was considered as seizure.  Extensive goals of care discussed with family and patient.  Neuro-oncology recommended hospice as there is lack of meaningful life-prolonging interventions.  Palliative care consulted, plan is to discharge her home with hospice whenever possible.  TOC following.  Assessment & Plan:  Active Problems:   Anaplastic astrocytoma (HCC)   Focal seizures (HCC)   Brain tumor (HCC)   Cancer associated pain   Seizures (HCC)   DNR (do not resuscitate)   Need for emotional support   Neck pain   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[facility])  Syncopal/likely seizure: Secondary to brain tumor.  High risk for falls.  Started on Keppra here at 1.5 mg twice daily.  Also on Decadron.  Progressive astrocytoma: Was following with Dr. Barbaraann Cao.  She has progression of disease.  She is hemiplegic on the left side also has left-sided hemianopia.  Oncology recommending palliative/hospice approach.  Tibsovo has been formally discontinued.  Cancer surgery pain: Continue pain management, supportive care.  Palliative care following.  Continue bowel regimen  Tobacco use: Continue nicotine patch  Hypertension: Patient's blood pressure noted to be persistently high.  Started on amlodipine 5 mg daily  Goals of care: CODE STATUS is DNR.  Plan for discharge to home with hospice.Her friend Ferne Reus will take care of her at home.  Very poor  prognosis         DVT prophylaxis:Place and maintain sequential compression device Start: 06/17/22 0047     Code Status: DNR  Family Communication: called her friend Ron at [phone] for update, call not received  Patient status:Inpatient  Patient is from :Home  Anticipated discharge FA:OZHY with Hospice  Estimated DC date:tomorrow   Consultants: Palliative care, neuro-oncology  Procedures: None  Antimicrobials:  Anti-infectives (From admission, onward)    None       Subjective: Patient seen and examined at the bedside today.  She was just waking up.  She complains of mild headache.  Denies any nausea or vomiting.  She was lying in bed.  Appears very weak on her left side.  Remains alert and oriented.  She understands her poor prognosis and she is aware about the plan for discharge to home with hospice.  I offered to call her friend Ron today  Objective: Vitals:   06/19/22 0806 06/19/22 1326 06/19/22 2103 06/20/22 0459  BP: (!) 168/92 (!) 141/87 (!) 143/90 (!) 153/95  Pulse: (!) 55 71 67 65  Resp: 16 20 18 16   Temp: (!) 97.5 F (36.4 C) 97.8 F (36.6 C) 97.7 F (36.5 C) 97.9 F (36.6 C)  TempSrc: Oral Oral Oral Oral  SpO2: 100% 100% 100% 100%  Weight:      Height:        Intake/Output Summary (Last 24 hours) at 06/20/2022 1141 Last data filed at 06/20/2022 0826 Gross per 24 hour  Intake 120 ml  Output 950 ml  Net -830 ml   Filed Weights   06/16/22 2347  Weight: 68.1 kg  Examination:  General exam: Weak, chronically ill looking HEENT: PERRL Respiratory system:  no wheezes or crackles  Cardiovascular system: S1 & S2 heard, RRR.  Gastrointestinal system: Abdomen is nondistended, soft and nontender. Central nervous system: Alert and oriented, weakness on the left side Extremities: Trace bilateral lower extremity edema, no clubbing ,no cyanosis, contracture of the left upper extremity Skin: No rashes, no ulcers,no icterus     Data Reviewed: I  have personally reviewed following labs and imaging studies  CBC: Recent Labs  Lab 06/15/22 1715 06/16/22 2001 06/17/22 0531  WBC 6.3 7.7 5.7  NEUTROABS 5.3 5.9 4.7  HGB 14.6 13.2 11.9*  HCT 45.3 41.0 37.2  MCV 93.6 95.6 96.6  PLT 193 170 163   Basic Metabolic Panel: Recent Labs  Lab 06/15/22 1715 06/16/22 2001 06/17/22 0531  NA 135 138 136  K 3.5 3.9 4.2  CL 101 105 107  CO2 24 25 24   GLUCOSE 110* 108* 117*  BUN 5* 10 7  CREATININE 0.63 0.78 0.67  CALCIUM 9.0 8.9 8.2*     No results found for this or any previous visit (from the past 240 hour(s)).   Radiology Studies: No results found.  Scheduled Meds:  amitriptyline  50 mg Oral QHS   dexamethasone  4 mg Oral QID   levETIRAcetam  1,500 mg Oral BID   melatonin  3 mg Oral QHS   oxyCODONE  10 mg Oral Q12H   pantoprazole  40 mg Oral Daily   senna  1 tablet Oral Daily   Continuous Infusions:   LOS: 2 days   Burnadette Pop, MD [facility] P5/23/2024, 11:41 AM

## 2022-06-20 NOTE — Progress Notes (Signed)
   I have been able to discuss with Ron her friend who she reports is her primary caregiver. I have spoken to Ron over the phone. He expresses being over whelmed. He states that she reports all the people will be assisting her at home but they are not there and helping. He states that Her mother really hasn't been around. He feels she needs to be placed because when I mention that pt needs 24/7 care with  her increased confusion and her limited ability to care for herself. He reports that he works. He leaves 6am and gets off at 500pm. He did confirm that there was a lady coming into the home for 4 hours a day but could not give me the name of the agency or tell me how this was being paid for. He also reports that he is calling everyone he can to try and get her power and water turned on that at this time that isn't even working but no-one is helping them. I have advised him that our MD feels that the pt will need a caregiver in the home with her to have hospice services. Hospice can not do 24.7 care. He understands this. Ron wonders if she can go to a facility but then states that he thought someone told him her insurance would not cover this. I have reached out to the [facility] to discuss as well. Norm Parcel RN [phone]

## 2022-06-20 NOTE — Progress Notes (Signed)
Chaplain engaged in a follow-up visit with Paula Farmer. She expressed her frustration and need to be heard and seen. She desires that the medical team take their time and slow down in their care with her. Chaplain offered supportive presence and reflection. Chaplain was able to get her crackers.   Chaplain assessed that [name] is experiencing confusion and forgetfulness and feels it in her interactions with people. Chaplain gently talked to her about this being the course of her tumor. This continues to be new for her and the transitions have been hard. Chaplain continues to affirm her feelings and emotions.     06/20/22 1100  Spiritual Encounters  Type of Visit Follow up  Care provided to: Patient

## 2022-06-21 DIAGNOSIS — Z66 Do not resuscitate: Secondary | ICD-10-CM | POA: Diagnosis not present

## 2022-06-21 DIAGNOSIS — G893 Neoplasm related pain (acute) (chronic): Secondary | ICD-10-CM | POA: Diagnosis not present

## 2022-06-21 DIAGNOSIS — G9389 Other specified disorders of brain: Secondary | ICD-10-CM | POA: Diagnosis not present

## 2022-06-21 DIAGNOSIS — C719 Malignant neoplasm of brain, unspecified: Secondary | ICD-10-CM | POA: Diagnosis not present

## 2022-06-21 DIAGNOSIS — Z515 Encounter for palliative care: Secondary | ICD-10-CM | POA: Diagnosis not present

## 2022-06-21 MED ORDER — CALCIUM CARBONATE ANTACID 500 MG PO CHEW
400.0000 mg | CHEWABLE_TABLET | Freq: Three times a day (TID) | ORAL | Status: DC | PRN
  Administered 2022-06-21 – 2022-06-23 (×2): 400 mg via ORAL
  Filled 2022-06-21 (×2): qty 2

## 2022-06-21 NOTE — Progress Notes (Signed)
PROGRESS NOTE  Paula Farmer  UEA:[id number] DOB: 1980/03/08 DOA: 06/16/2022 PCP: Center, [facility]   Brief Narrative: Patient is a 42 year old unfortunate  female with history of anaplastic astrocytoma with progression, initially diagnosed in 2018 ,who presented with syncopal event from home.  Also reported headache and photophobia.  Remains weak on the left side, impaired vision on the left side as well.  Neuro-oncology was consulted after admission and was following.  She was declared to have progressive and refractory astrocytoma.  Her syncopal episode was considered as seizure.  Extensive goals of care discussed with family and patient.  Neuro-oncology recommended hospice as there is lack of meaningful life-prolonging interventions.  Palliative care consulted, plan is to discharge her home with hospice whenever possible( waiting for full arrangement/care taker at home).  TOC following.  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  High risk for falls.  Started on Keppra here at 1.5 mg twice daily.  Also on Decadron.  Progressive astrocytoma: Was following with Dr. Barbaraann Cao.  She has progression of disease.  She is hemiplegic on the left side also has left-sided hemianopia.  Oncology recommending palliative/hospice approach.  Tibsovo has been formally discontinued.  Cancer surgery pain: Continue pain management, supportive care.  Palliative care following.  Continue bowel regimen  Tobacco use: Continue nicotine patch  Hypertension: Patient's blood pressure noted to be persistently high.  Started on amlodipine 5 mg daily  Goals of care: CODE STATUS is DNR.  Plan for discharge to home with hospice.Her  friend Ferne Reus was supposed to take care of her at home.  Very poor prognosis         DVT prophylaxis:Place and maintain sequential compression device Start: 06/17/22 0047     Code Status: DNR  Family Communication: called her friend Ron at [phone] for update, call not received.Called mother on phone twice today,calls not received  Patient status:Inpatient  Patient is from :Home  Anticipated discharge YQ:MVHQ with Hospice  Estimated DC date:not sure   Consultants: Palliative care, neuro-oncology  Procedures: None  Antimicrobials:  Anti-infectives (From admission, onward)    None       Subjective: Patient seen and examined the bedside today.  Hemodynamically stable.  She looks more alert ,fully oriented.  Lies in bed.  Has dense left-sided hemiplegia.  She is eager to go home but we discussed about the importance of presence of somebody who can take care of her at home.  Objective: Vitals:   06/20/22 1201 06/20/22 1357 06/20/22 2139 06/21/22 0605  BP: (!) 143/87 (!) 132/96 (!) 123/95 (!) 150/91  Pulse: (!) 115 85 73 64  Resp: 18 16 14 14   Temp:  98.4 F (36.9 C) 97.8 F (36.6 C) 97.9 F (36.6 C)  TempSrc:   Oral Oral  SpO2: 99% 100% 100% 100%  Weight:      Height:        Intake/Output Summary (Last 24 hours) at 06/21/2022 1125 Last data filed at 06/21/2022 4696 Gross per 24 hour  Intake 120 ml  Output 1900 ml  Net -1780 ml   Filed Weights   06/16/22 2347  Weight: 68.1 kg    Examination:   General exam: weak,  not in distress HEENT: PERRL Respiratory system:  no wheezes or crackles  Cardiovascular system: S1 & S2 heard, RRR.  Gastrointestinal system: Abdomen is nondistended, soft and nontender. Central nervous system: Alert and oriented, weakness on the left side Extremities: Trace bilateral lower extremity edema, no clubbing ,no cyanosis, contracture of left upper extremity Skin: No rashes, no ulcers,no icterus     Data Reviewed: I have  personally reviewed following labs and imaging studies  CBC: Recent Labs  Lab 06/15/22 1715 06/16/22 2001 06/17/22 0531  WBC 6.3 7.7 5.7  NEUTROABS 5.3 5.9 4.7  HGB 14.6 13.2 11.9*  HCT 45.3 41.0 37.2  MCV 93.6 95.6 96.6  PLT 193 170 163   Basic Metabolic Panel: Recent Labs  Lab 06/15/22 1715 06/16/22 2001 06/17/22 0531  NA 135 138 136  K 3.5 3.9 4.2  CL 101 105 107  CO2 24 25 24   GLUCOSE 110* 108* 117*  BUN 5* 10 7  CREATININE 0.63 0.78 0.67  CALCIUM 9.0 8.9 8.2*     No results found for this or any previous visit (from the past 240 hour(s)).   Radiology Studies: No results found.  Scheduled Meds:  amitriptyline  50 mg Oral QHS   amLODipine  5 mg Oral Daily   dexamethasone  4 mg Oral QID   levETIRAcetam  1,500 mg Oral BID   melatonin  3 mg Oral QHS   oxyCODONE  10 mg Oral Q12H   pantoprazole  40 mg Oral Daily   polyethylene glycol  17 g Oral Daily   senna  1 tablet Oral Daily   Continuous Infusions:   LOS: 3 days   Burnadette Pop, MD [facility] P5/24/2024, 11:25 AM

## 2022-06-21 NOTE — TOC Progression Note (Addendum)
Transition of Care [facility]) - Progression Note    Patient Details  Name: Paula Farmer MRN: [id number] Date of Birth: 06-08-1980  Transition of Care [facility]) CM/SW Contact  Beckie Busing, RN Phone [phone]  06/21/2022, 10:54 AM  Clinical Narrative:    CM at bedside to discuss disposition planning. Patient states that she lives at home with her 3 children ages 24,18 & 40. Patient acknowledges that her children will not be able to provide 24/7 care and that she has no family that will be able to participate in her care. Patient gives CM verbal consent to speak with her mother about disposition planning and to initiate SNF search  for placement.   CM spoke with patient mother Paula Farmer. Mother states that she lives in [place] with no transportation and is unable to care for patient. Mother is in agreement for search for placement. CM has made mother and patient aware that barrier for placement may be payor source if the patient doesn't have disability medicaid will not pay for SNF. CM will fax patient out for bed offers.   1149 FL2 completed and faxed out for bed offers.   1526 Currently no bed offers, Search expanded   Expected Discharge Plan: Home/Self Care Barriers to Discharge: Continued Medical Work up  Expected Discharge Plan and Services       Living arrangements for the past 2 months: Apartment                                       Social Determinants of Health (SDOH) Interventions SDOH Screenings   Food Insecurity: Food Insecurity Present (06/17/2022)  Housing: Patient Declined (06/17/2022)  Transportation Needs: Unmet Transportation Needs (06/17/2022)  Utilities: At Risk (06/17/2022)  Tobacco Use: High Risk (06/16/2022)    Readmission Risk Interventions    04/28/2022    2:48 PM  Readmission Risk Prevention Plan  Post Dischage Appt Complete  Medication Screening Complete  Transportation Screening Complete

## 2022-06-21 NOTE — Progress Notes (Signed)
Daily Progress Note   Patient Name: Paula Farmer       Date: 06/21/2022 DOB: 02-Mar-1980  Age: 42 y.o. MRN#: [id number] Attending Physician: Paula Pop, MD Primary Care Physician: Center, [facility] Admit Date: 06/16/2022 Length of Stay: 3 days  Reason for Consultation/Follow-up: Establishing goals of care and symptom management  Subjective:   CC: Patient resting peacefully when presenting to bedside.  Following up regarding complex medical decision making and symptom management.   Subjective:  Reviewed EMR prior to presenting to bedside.  EMR review in the past 24 hours patient has required oxycodone 15 mg x 2 doses.  Patient continues to receive OxyContin 10 mg scheduled every 12 hours.  Patient's short acting opioid requirements have decreased since initiation of long-acting opioids.  Presented to bedside to check on patient.  Patient seen sleeping comfortably in bed.  Patient easily awakened and noted she is comfortable at this time and resting.  Only concern for patient at this time was obtaining a tuna sandwich.  Noted would let nurse know.  Discussed palliative medicine team will continue to follow along.  Discussed care updates with bedside RN after visit.  Have also been discussing care with care team including hospitalist and TOC.  Family has stated they are unable to care for patient at home with 24/7 help.  At this time trying to find appropriate, safe discharge planning for patient where she can receive the appropriate level of care needed at end-of-life.  Review of Systems Pain improved  Objective:   Vital Signs:  BP (!) 150/91 (BP Location: Right Arm)   Pulse 64   Temp 97.9 F (36.6 C) (Oral)   Resp 14   Ht 5' 5.98" (1.676 m)   Wt 68.1 kg   SpO2 100%   BMI 24.24 kg/m   Physical Exam: General: NAD, sleeping comfortably though easily awakened, chronically ill-appearing Eyes: No drainage noted HENT: moist mucous  membranes Cardiovascular: RRR Respiratory: no increased work of breathing noted, not in respiratory distress Abdomen: not distended Extremities: Weakness noted in all extremities especially left side Skin: no rashes or lesions on visible skin Neuro: increased confusion with underlying disease process  Imaging:  I personally reviewed recent imaging.   Assessment & Plan:   Assessment: Patient is a 42 year old female with past medical history of recurrent anaplastic astrocytoma who was admitted on 06/16/2022 after near syncopal event at home. Patient is well-known to the palliative medicine service as has been seen during prior hospitalizations. Palliative medicine team consulted to assist with complex medical decision making and symptom management.   Recommendations/Plan: # Complex medical decision making/goals of care:   - Patient is not a candidate for any further cancer directed therapies as per oncology here and oncology at [facility].  Patient has agreed with pursuing comfort focused care at this time. TOC and [facility] liaison have been involved to determine can receive they level of care she requires. Family stating they are unable to care for patient at home. Needing to seek long term care placement with hospice.             -  Code Status: DNR Prognosis: weeks-months (<6 months)   # Symptom management:                -Neck/Back pain with headache, is setting of recurrent anaplastic astrocytoma                              -  Continue topical lidocaine cream    -Continue OxyContin 10mg  q12hrs tonight   -Continue oxycodone 15mg  q4hrs prn   -Continue IV dilaudid 1mg  q2hrs prn for breakthrough pain   # Psychosocial Support:                - Ron-friend, children, sister                - chaplain involved for support   # Discharge Planning: TBD. Care team along with [facility] liaison working to determine where patient can receive 24/7 support for care with hospice.  Family notes they are unable to care for patient at home.    Discussed with: Hospitalist, RN, TOC, patient   Thank you for allowing the palliative care team to participate in the care [name].  Alvester Morin, DO Palliative Care Provider PMT # [phone]  If patient remains symptomatic despite maximum doses, please call PMT at [phone] between 0700 and 1900. Outside of these hours, please call attending, as PMT does not have night coverage.   *Please note that this is a verbal dictation therefore any spelling or grammatical errors are due to the "Dragon Medical One" system interpretation.

## 2022-06-21 NOTE — [place] FL2 (Signed)
[place] MEDICAID FL2 LEVEL OF CARE FORM     IDENTIFICATION  Patient Name: Paula Farmer Birthdate: 05-08-1980 Sex: female Admission Date (Current Location): 06/16/2022  [place] and [place] Number:  Haynes Bast [id number] P Facility and Address:  [facility],  [address], [place] [address]      Provider Number: [id number]  Attending Physician Name and Address:  Burnadette Pop, MD  Relative Name and Phone Number:  Revan Strojny [phone] or [phone]    Current Level of Care: Hospital Recommended Level of Care: Skilled Nursing Facility Prior Approval Number:    Date Approved/Denied:   PASRR Number:    Discharge Plan: SNF    Current Diagnoses: Patient Active Problem List   Diagnosis Date Noted   Seizures (HCC) 06/18/2022   DNR (do not resuscitate) 06/18/2022   Need for emotional support 06/18/2022   Neck pain 06/18/2022   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(HCC) 06/18/2022   Palliative care encounter 04/29/2022   Goals of care, counseling/discussion 04/29/2022   Counseling and coordination of care 04/29/2022   High risk medication use 04/29/2022   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   Left-sided weakness 04/29/2022   Brain tumor (HCC) 04/27/2022   Left hemiparesis (HCC) 04/26/2022   History of seizure 04/26/2022   Hypokalemia 04/26/2022   Focal seizures (HCC) 02/21/2022   Vasogenic edema (HCC)    Headache 07/17/2020   Intractable headache 07/17/2020   GAD (generalized anxiety disorder) 11/20/2018   Anaplastic astrocytoma (HCC) 09/18/2016    Orientation RESPIRATION BLADDER Height & Weight     Self, Time, Situation, Place  Normal Incontinent Weight: 68.1 kg Height:  5' 5.98" (167.6 cm)  BEHAVIORAL SYMPTOMS/MOOD NEUROLOGICAL BOWEL NUTRITION STATUS      Continent Diet  AMBULATORY STATUS COMMUNICATION OF NEEDS Skin   Total Care Verbally Normal                       Personal Care Assistance Level of Assistance  Bathing, Feeding, Dressing  Bathing Assistance: Maximum assistance Feeding assistance: Limited assistance (set up) Dressing Assistance: Maximum assistance     Functional Limitations Info  Sight, Hearing, Speech Sight Info: Impaired (brain tumor affecting sight) Hearing Info: Impaired Speech Info: Adequate    SPECIAL CARE FACTORS FREQUENCY  PT (By licensed PT), OT (By licensed OT)     PT Frequency: 3Xwk OT Frequency: 3X/wk            Contractures Contractures Info: Not present    Additional Factors Info  Code Status, Allergies, Psychotropic, Insulin Sliding Scale, Isolation Precautions, Suctioning Needs Code Status Info: DNR Allergies Info: No known Allergies Psychotropic Info: see discharge summary Insulin Sliding Scale Info: see discharge summary Isolation Precautions Info: n/a Suctioning Needs: n/a   Current Medications (06/21/2022):  This is the current hospital active medication list Current Facility-Administered Medications  Medication Dose Route Frequency Provider Last Rate Last Admin   acetaminophen (TYLENOL) tablet 650 mg  650 mg Oral Q6H PRN Crosley, Debby, MD   650 mg at 06/17/22 0809   Or   acetaminophen (TYLENOL) suppository 650 mg  650 mg Rectal Q6H PRN Crosley, Debby, MD       amitriptyline (ELAVIL) tablet 50 mg  50 mg Oral QHS Vann, Jessica U, DO   50 mg at 06/20/22 2107   amLODipine (NORVASC) tablet 5 mg  5 mg Oral Daily Burnadette Pop, MD   5 mg at 06/21/22 0943   dexamethasone (DECADRON) tablet 4 mg  4 mg Oral QID Gery Pray, MD  4 mg at 06/21/22 0943   hydrALAZINE (APRESOLINE) injection 5 mg  5 mg Intravenous Q4H PRN Marlin Canary U, DO       HYDROmorphone (DILAUDID) injection 1 mg  1 mg Intravenous Q2H PRN Alena Bills, DO   1 mg at 06/20/22 1206   levETIRAcetam (KEPPRA) tablet 1,500 mg  1,500 mg Oral BID Marlin Canary U, DO   1,500 mg at 06/21/22 0943   lidocaine (LMX) 4 % cream   Topical QID PRN Alena Bills, DO   Given at 06/18/22 1253   LORazepam (ATIVAN) tablet 1  mg  1 mg Oral TID PRN Gery Pray, MD   1 mg at 06/19/22 2007   melatonin tablet 3 mg  3 mg Oral QHS Vann, Jessica U, DO   3 mg at 06/20/22 2107   ondansetron (ZOFRAN) tablet 4 mg  4 mg Oral Q6H PRN Gery Pray, MD       Or   ondansetron (ZOFRAN) injection 4 mg  4 mg Intravenous Q6H PRN Crosley, Debby, MD       oxyCODONE (Oxy IR/ROXICODONE) immediate release tablet 15 mg  15 mg Oral Q4H PRN Marlin Canary U, DO   15 mg at 06/21/22 0942   oxyCODONE (OXYCONTIN) 12 hr tablet 10 mg  10 mg Oral Q12H Mims, Lauren W, DO   10 mg at 06/21/22 0943   pantoprazole (PROTONIX) EC tablet 40 mg  40 mg Oral Daily Crosley, Debby, MD   40 mg at 06/21/22 0942   phenol (CHLORASEPTIC) mouth spray 1 spray  1 spray Mouth/Throat PRN Alena Bills, DO   1 spray at 06/20/22 1420   polyethylene glycol (MIRALAX / GLYCOLAX) packet 17 g  17 g Oral Daily Burnadette Pop, MD   17 g at 06/21/22 2956   senna (SENOKOT) tablet 8.6 mg  1 tablet Oral Daily Gery Pray, MD   8.6 mg at 06/21/22 2130     Discharge Medications: Please see discharge summary for a list of discharge medications.  Relevant Imaging Results:  Relevant Lab Results:   Additional Information SS# [id number]  Beckie Busing, RN

## 2022-06-22 DIAGNOSIS — G893 Neoplasm related pain (acute) (chronic): Secondary | ICD-10-CM | POA: Diagnosis not present

## 2022-06-22 DIAGNOSIS — Z66 Do not resuscitate: Secondary | ICD-10-CM | POA: Diagnosis not present

## 2022-06-22 DIAGNOSIS — C719 Malignant neoplasm of brain, unspecified: Secondary | ICD-10-CM | POA: Diagnosis not present

## 2022-06-22 DIAGNOSIS — G9389 Other specified disorders of brain: Secondary | ICD-10-CM | POA: Diagnosis not present

## 2022-06-22 DIAGNOSIS — Z515 Encounter for palliative care: Secondary | ICD-10-CM | POA: Diagnosis not present

## 2022-06-22 LAB — BASIC METABOLIC PANEL WITH GFR
Anion gap: 13 (ref 5–15)
BUN: 15 mg/dL (ref 6–20)
CO2: 24 mmol/L (ref 22–32)
Calcium: 9.1 mg/dL (ref 8.9–10.3)
Chloride: 100 mmol/L (ref 98–111)
Creatinine, Ser: 0.7 mg/dL (ref 0.44–1.00)
GFR, Estimated: >60 mL/min
Glucose, Bld: 148 mg/dL — ABNORMAL HIGH (ref 70–99)
Potassium: 4 mmol/L (ref 3.5–5.1)
Sodium: 137 mmol/L (ref 135–145)

## 2022-06-22 LAB — CBC
HCT: 43.3 % (ref 36.0–46.0)
Hemoglobin: 14.1 g/dL (ref 12.0–15.0)
MCH: 30.9 pg (ref 26.0–34.0)
MCHC: 32.6 g/dL (ref 30.0–36.0)
MCV: 94.7 fL (ref 80.0–100.0)
Platelets: 207 10*3/uL (ref 150–400)
RBC: 4.57 MIL/uL (ref 3.87–5.11)
RDW: 14.2 % (ref 11.5–15.5)
WBC: 11.5 10*3/uL — ABNORMAL HIGH (ref 4.0–10.5)
nRBC: 0 % (ref 0.0–0.2)

## 2022-06-22 NOTE — Progress Notes (Signed)
Daily Progress Note   Patient Name: Paula Farmer       Date: 06/22/2022 DOB: October 10, 1980  Age: 42 y.o. MRN#: [id number] Attending Physician: Willeen Niece, MD Primary Care Physician: Center, [facility] Admit Date: 06/16/2022 Length of Stay: 4 days  Reason for Consultation/Follow-up: Establishing goals of care and symptom management  Subjective:   CC: Patient going to eat breakfast when present.  Following up regarding complex medical decision making and symptom management.   Subjective:  Reviewed EMR prior to presenting to bedside.  At time of EMR review in the past 24 hours patient has required oxycodone 15 mg x 1 dose and IV dilaudid 1mg  x 2 doses. Patient continues to receive OxyContin 10mg  q12hrs scheduled.   This care with bedside RN prior to presenting to bedside.  Patient continues to become more confused.  Patient having difficulty keeping track of conversations with staff.  Presented to bedside to check on patient.  Patient laying comfortably in bed.  Patient noted she had just ordered breakfast and was about to eat.  Patient notes that her pain is improved at this time.  Patient's only request is to put a sign on the door that asked people to please not lately so that it does not worsen her headache or startle her awake.  Noted happy to do this.  Patient's only other request with a cup of coffee which this provider was also able to oblige with.  Patient then later on noted that she is having difficulty at this point even controlling her right hand to feed herself so noted would inform staff to have tech assist.  Patient voiced appreciation for visit today.  Review of Systems Pain managed currently  Objective:   Vital Signs:  BP (!) 149/99 (BP Location: Right Arm)   Pulse 71   Temp 97.6 F (36.4 C) (Oral)   Resp 16   Ht 5' 5.98" (1.676 m)   Wt 68.1 kg   SpO2 100%   BMI 24.24 kg/m   Physical Exam: General: NAD, laying comfortably in bed,,  chronically ill-appearing Eyes: No drainage noted HENT: moist mucous membranes Cardiovascular: RRR Respiratory: no increased work of breathing noted, not in respiratory distress Abdomen: not distended Extremities: Weakness noted in all extremities especially left side Skin: no rashes or lesions on visible skin Neuro: increased confusion with underlying disease process  Imaging:  I personally reviewed recent imaging.   Assessment & Plan:   Assessment: Patient is a 42 year old female with past medical history of recurrent anaplastic astrocytoma who was admitted on 06/16/2022 after near syncopal event at home. Patient is well-known to the palliative medicine service as has been seen during prior hospitalizations. Palliative medicine team consulted to assist with complex medical decision making and symptom management.   Recommendations/Plan: # Complex medical decision making/goals of care:   - Patient is not a candidate for any further cancer directed therapies as per oncology here and oncology at [facility].  Patient has agreed with pursuing comfort focused care. Family stating they are unable to care for patient at home. Needing to seek long term care placement with hospice support.            -  Code Status: DNR Prognosis: weeks-months (<6 months)   # Symptom management:                -Neck/Back pain with headache, is setting of recurrent anaplastic astrocytoma                              -  Continue topical lidocaine cream    -Continue OxyContin 10mg  q12hrs tonight   -Continue oxycodone 15mg  q4hrs prn   -Continue IV dilaudid 1mg  q2hrs prn for breakthrough pain   # Psychosocial Support:                - Ron-friend, children, sister                - chaplain involved for support   # Discharge Planning: TBD. TOC along with [facility] liaison working to determine where patient can receive 24/7 support for care with hospice. Family notes they are unable to care for patient at  home.    Discussed with: RN,  patient   Thank you for allowing the palliative care team to participate in the care [name].  Alvester Morin, DO Palliative Care Provider PMT # [phone]  If patient remains symptomatic despite maximum doses, please call PMT at [phone] between 0700 and 1900. Outside of these hours, please call attending, as PMT does not have night coverage.  *Please note that this is a verbal dictation therefore any spelling or grammatical errors are due to the "Dragon Medical One" system interpretation.

## 2022-06-22 NOTE — Progress Notes (Signed)
PROGRESS NOTE    Paula Farmer  ZOX:[id number] DOB: [birth year] DOA: 06/16/2022 PCP: Center, [facility]   Brief Narrative:  This 42 year old unfortunate female with history of anaplastic astrocytoma with progression, initially diagnosed in 2018 ,who presented with syncopal event from home.  Also reported headache and photophobia.  Remains weak on the left side, impaired vision on the left side as well.  Neuro-oncology was consulted after admission and was following.  She was declared to have progressive and refractory astrocytoma.  Her syncopal episode was considered as seizure.  Extensive goals of care discussed with family and patient.  Neuro-oncology recommended hospice as there is lack of meaningful life-prolonging interventions.  Palliative care consulted, plan is to discharge her home with hospice whenever possible ( waiting for full arrangement/care taker at home).  TOC following.   Assessment & Plan:   Active Problems:   Anaplastic astrocytoma (HCC)   Focal seizures (HCC)   Brain tumor (HCC)   Cancer associated pain   Seizures (HCC)   DNR (do not resuscitate)   Need for emotional support   Neck pain   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(HCC)   Syncope likely seizure:  Secondary to brain tumor.  High risk for falls.  Started on Keppra here at 1.5 mg twice daily.  Also on Decadron.   Progressive astrocytoma: She was following with Dr. Barbaraann Cao.  She has progression of disease.   She is hemiplegic on the left side,  also has left-sided hemianopia.   Oncology recommending palliative/hospice approach.  Tibsovo has been formally discontinued.   Cancer surgery pain:  Continue pain management, supportive care.  Palliative care following.  Continue bowel regimen.   Tobacco use: Continue nicotine patch   Hypertension: Patient's blood pressure noted to be persistently high. Continue amlodipine 5 mg daily.   Goals of care: CODE STATUS is DNR.  Plan for discharge to home  with hospice. Her friend Ferne Reus was supposed to take care of her at home.  Very poor prognosis   DVT prophylaxis: SCDS Code Status: DNR Family Communication: No family at bed side. Disposition Plan:   Status is: Inpatient Remains inpatient appropriate because: Family not able to take care of the patient.  TOC working along with hospice of [employer] liaison to determine where patient can receive 24/7 support for hospice   Consultants:  Neurosurgery Palliative care  Procedures: None Antimicrobials:  Anti-infectives (From admission, onward)    None      Subjective: Patient was seen and examined at bedside.  Overnight events noted.  She looks more alert, oriented,  lies in the bed.  Has left-sided hemiplegia. She is eager to go home but needs 24/7 caregiver.  Family is not able to provide 24/7 care.  Objective: Vitals:   06/21/22 0605 06/21/22 1353 06/21/22 2127 06/22/22 0442  BP: (!) 150/91 (!) 150/101 (!) 140/83 (!) 149/99  Pulse: 64 92 86 71  Resp: 14  16 16   Temp: 97.9 F (36.6 C) 97.9 F (36.6 C) 97.9 F (36.6 C) 97.6 F (36.4 C)  TempSrc: Oral Oral Oral Oral  SpO2: 100% 100% 99% 100%  Weight:      Height:        Intake/Output Summary (Last 24 hours) at 06/22/2022 1329 Last data filed at 06/22/2022 0700 Gross per 24 hour  Intake 480 ml  Output --  Net 480 ml   Filed Weights   06/16/22 2347  Weight: 68.1 kg    Examination:  General exam: Appears calm and comfortable,  weak not in any distress. Respiratory system: Clear to auscultation. Respiratory effort normal.  RR 16 Cardiovascular system: S1 & S2 heard, RRR. No JVD, murmurs, rubs, gallops or clicks. No pedal edema. Gastrointestinal system: Abdomen is soft, non tender, non distended, BS+ Central nervous system: Alert and oriented x 3 .  Weakness on the left side. Extremities: Symmetric 5 x 5 power. Skin: No rashes, lesions or ulcers Psychiatry: Judgement and insight appear normal. Mood & affect appropriate.      Data Reviewed: I have personally reviewed following labs and imaging studies  CBC: Recent Labs  Lab 06/15/22 1715 06/16/22 2001 06/17/22 0531 06/22/22 0659  WBC 6.3 7.7 5.7 11.5*  NEUTROABS 5.3 5.9 4.7  --   HGB 14.6 13.2 11.9* 14.1  HCT 45.3 41.0 37.2 43.3  MCV 93.6 95.6 96.6 94.7  PLT 193 170 163 207   Basic Metabolic Panel: Recent Labs  Lab 06/15/22 1715 06/16/22 2001 06/17/22 0531 06/22/22 0659  NA 135 138 136 137  K 3.5 3.9 4.2 4.0  CL 101 105 107 100  CO2 24 25 24 24   GLUCOSE 110* 108* 117* 148*  BUN 5* 10 7 15   CREATININE 0.63 0.78 0.67 0.70  CALCIUM 9.0 8.9 8.2* 9.1   GFR: Estimated Creatinine Clearance: 87.5 mL/min (by C-G formula based on SCr of 0.7 mg/dL). Liver Function Tests: Recent Labs  Lab 06/15/22 1715  AST 16  ALT 18  ALKPHOS 43  BILITOT 0.5  PROT 7.3  ALBUMIN 4.2   No results for input(s): "LIPASE", "AMYLASE" in the last 168 hours. No results for input(s): "AMMONIA" in the last 168 hours. Coagulation Profile: No results for input(s): "INR", "PROTIME" in the last 168 hours. Cardiac Enzymes: No results for input(s): "CKTOTAL", "CKMB", "CKMBINDEX", "TROPONINI" in the last 168 hours. BNP (last 3 results) No results for input(s): "PROBNP" in the last 8760 hours. HbA1C: No results for input(s): "HGBA1C" in the last 72 hours. CBG: Recent Labs  Lab 06/16/22 1811  GLUCAP 106*   Lipid Profile: No results for input(s): "CHOL", "HDL", "LDLCALC", "TRIG", "CHOLHDL", "LDLDIRECT" in the last 72 hours. Thyroid Function Tests: No results for input(s): "TSH", "T4TOTAL", "FREET4", "T3FREE", "THYROIDAB" in the last 72 hours. Anemia Panel: No results for input(s): "VITAMINB12", "FOLATE", "FERRITIN", "TIBC", "IRON", "RETICCTPCT" in the last 72 hours. Sepsis Labs: No results for input(s): "PROCALCITON", "LATICACIDVEN" in the last 168 hours.  No results found for this or any previous visit (from the past 240 hour(s)).   Radiology Studies: No  results found.  Scheduled Meds:  amitriptyline  50 mg Oral QHS   amLODipine  5 mg Oral Daily   dexamethasone  4 mg Oral QID   levETIRAcetam  1,500 mg Oral BID   melatonin  3 mg Oral QHS   oxyCODONE  10 mg Oral Q12H   pantoprazole  40 mg Oral Daily   polyethylene glycol  17 g Oral Daily   senna  1 tablet Oral Daily   Continuous Infusions:   LOS: 4 days    Time spent: 50 mins    Willeen Niece, MD [facility]   If 7PM-7AM, please contact night-coverage

## 2022-06-23 DIAGNOSIS — G9389 Other specified disorders of brain: Secondary | ICD-10-CM | POA: Diagnosis not present

## 2022-06-23 MED ORDER — BISACODYL 10 MG RE SUPP
10.0000 mg | Freq: Once | RECTAL | Status: DC
  Filled 2022-06-23: qty 1

## 2022-06-23 NOTE — Progress Notes (Signed)
PROGRESS NOTE    Paula Farmer  GMW:[id number] DOB: [birth year] DOA: 06/16/2022 PCP: Center, [facility]   Brief Narrative:  This 42 year old unfortunate female with history of anaplastic astrocytoma with progression, initially diagnosed in 2018 ,who presented with syncopal event from home.  Also reported headache and photophobia.  Remains weak on the left side, impaired vision on the left side as well. Neuro-oncology was consulted after admission and was following.  She was declared to have progressive and refractory astrocytoma.  Her syncopal episode was considered as seizure.  Extensive goals of care discussed with family and patient.  Neuro-oncology recommended hospice as there is lack of meaningful life-prolonging interventions. Palliative care consulted, plan is to discharge her home with hospice whenever possible ( waiting for full arrangement/care taker at home).  TOC following.   Assessment & Plan:   Active Problems:   Anaplastic astrocytoma (HCC)   Focal seizures (HCC)   Brain tumor (HCC)   Cancer associated pain   Seizures (HCC)   DNR (do not resuscitate)   Need for emotional support   Neck pain   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(HCC)  Syncope likely seizure:  Secondary to brain tumor.  High risk for falls.  Started on Keppra here at 1.5 mg twice daily.  Also on Decadron.   Progressive astrocytoma: She was following with Dr. Barbaraann Cao.  She has progression of disease.   She is hemiplegic on the left side,  also has left-sided hemianopia.   Oncology recommending palliative/hospice approach.  Tibsovo has been formally discontinued.   Cancer surgery pain:  Continue pain management, supportive care.   Palliative care following.  Continue bowel regimen.   Tobacco use: Continue nicotine patch.   Hypertension: Patient's blood pressure noted to be persistently high. Continue amlodipine 5 mg daily.   Goals of care: CODE STATUS is DNR.  Plan for discharge to home  with hospice. Her friend Ferne Reus was supposed to take care of her at home.  Very poor prognosis   DVT prophylaxis: SCDS Code Status: DNR Family Communication: No family at bed side. Disposition Plan:   Status is: Inpatient Remains inpatient appropriate because: Family not able to take care of the patient.  TOC working along with hosp[facility] [employer] liaison to determine where patient can receive 24/7 support for hospice   Consultants:  Neurosurgery Palliative care  Procedures: None Antimicrobials:  Anti-infectives (From admission, onward)    None      Subjective: Patient was seen and examined at bedside.  Overnight events noted.   Patient appears alert but slightly confused, lies in bed. Has left-sided hemiplegia. She is eager to go home but needs 24/7 caregiver.  Family is not able to provide 24/7 care.  Objective: Vitals:   06/22/22 0442 06/22/22 1439 06/22/22 2147 06/23/22 0601  BP: (!) 149/99 (!) 141/88 (!) 140/87 120/78  Pulse: 71 (!) 107 85 82  Resp: 16  14 14   Temp: 97.6 F (36.4 C)  97.7 F (36.5 C) 98.7 F (37.1 C)  TempSrc: Oral  Oral Oral  SpO2: 100% 100% 99% 99%  Weight:      Height:        Intake/Output Summary (Last 24 hours) at 06/23/2022 1409 Last data filed at 06/23/2022 0900 Gross per 24 hour  Intake 920 ml  Output 700 ml  Net 220 ml   Filed Weights   06/16/22 2347  Weight: 68.1 kg    Examination:  General exam: Appears calm and comfortable, deconditioned nut  not in  any distress. Respiratory system: Clear to auscultation. Respiratory effort normal.  RR 16 Cardiovascular system: S1 & S2 heard, RRR. No JVD, murmurs, rubs, gallops or clicks. Gastrointestinal system: Abdomen is soft, non tender, non distended, BS+ Central nervous system: Alert and oriented x 3 .  Weakness on the left side. Extremities: No edema, no cyanosis, no clubbing. Skin: No rashes, lesions or ulcers Psychiatry: Judgement and insight appear normal. Mood & affect  appropriate.     Data Reviewed: I have personally reviewed following labs and imaging studies  CBC: Recent Labs  Lab 06/16/22 2001 06/17/22 0531 06/22/22 0659  WBC 7.7 5.7 11.5*  NEUTROABS 5.9 4.7  --   HGB 13.2 11.9* 14.1  HCT 41.0 37.2 43.3  MCV 95.6 96.6 94.7  PLT 170 163 207   Basic Metabolic Panel: Recent Labs  Lab 06/16/22 2001 06/17/22 0531 06/22/22 0659  NA 138 136 137  K 3.9 4.2 4.0  CL 105 107 100  CO2 25 24 24   GLUCOSE 108* 117* 148*  BUN 10 7 15   CREATININE 0.78 0.67 0.70  CALCIUM 8.9 8.2* 9.1   GFR: Estimated Creatinine Clearance: 87.5 mL/min (by C-G formula based on SCr of 0.7 mg/dL). Liver Function Tests: No results for input(s): "AST", "ALT", "ALKPHOS", "BILITOT", "PROT", "ALBUMIN" in the last 168 hours.  No results for input(s): "LIPASE", "AMYLASE" in the last 168 hours. No results for input(s): "AMMONIA" in the last 168 hours. Coagulation Profile: No results for input(s): "INR", "PROTIME" in the last 168 hours. Cardiac Enzymes: No results for input(s): "CKTOTAL", "CKMB", "CKMBINDEX", "TROPONINI" in the last 168 hours. BNP (last 3 results) No results for input(s): "PROBNP" in the last 8760 hours. HbA1C: No results for input(s): "HGBA1C" in the last 72 hours. CBG: Recent Labs  Lab 06/16/22 1811  GLUCAP 106*   Lipid Profile: No results for input(s): "CHOL", "HDL", "LDLCALC", "TRIG", "CHOLHDL", "LDLDIRECT" in the last 72 hours. Thyroid Function Tests: No results for input(s): "TSH", "T4TOTAL", "FREET4", "T3FREE", "THYROIDAB" in the last 72 hours. Anemia Panel: No results for input(s): "VITAMINB12", "FOLATE", "FERRITIN", "TIBC", "IRON", "RETICCTPCT" in the last 72 hours. Sepsis Labs: No results for input(s): "PROCALCITON", "LATICACIDVEN" in the last 168 hours.  No results found for this or any previous visit (from the past 240 hour(s)).   Radiology Studies: No results found.  Scheduled Meds:  amitriptyline  50 mg Oral QHS    amLODipine  5 mg Oral Daily   bisacodyl  10 mg Rectal Once   dexamethasone  4 mg Oral QID   levETIRAcetam  1,500 mg Oral BID   melatonin  3 mg Oral QHS   oxyCODONE  10 mg Oral Q12H   pantoprazole  40 mg Oral Daily   polyethylene glycol  17 g Oral Daily   senna  1 tablet Oral Daily   Continuous Infusions:   LOS: 5 days    Time spent: 35 mins    Willeen Niece, MD [facility]   If 7PM-7AM, please contact night-coverage

## 2022-06-23 NOTE — Progress Notes (Signed)
  Daily Progress Note   Patient Name: Paula Farmer       Date: 06/23/2022 DOB: 02/19/1980  Age: 42 y.o. MRN#: [id number] Attending Physician: Willeen Niece, MD Primary Care Physician: Center, [facility] Admit Date: 06/16/2022 Length of Stay: 5 days  Discussed care with bedside RN today. Patient's symptom currently managed. Patient awaiting long term care placement with hospice. Continuing comfort focused care here while admitted. Palliative medicine team continues to follow along with patient's medical journey.    Alvester Morin, DO Palliative Care Provider PMT # [phone]

## 2022-06-24 DIAGNOSIS — Z515 Encounter for palliative care: Secondary | ICD-10-CM | POA: Diagnosis not present

## 2022-06-24 DIAGNOSIS — B370 Candidal stomatitis: Secondary | ICD-10-CM

## 2022-06-24 DIAGNOSIS — G893 Neoplasm related pain (acute) (chronic): Secondary | ICD-10-CM | POA: Diagnosis not present

## 2022-06-24 DIAGNOSIS — G9389 Other specified disorders of brain: Secondary | ICD-10-CM | POA: Diagnosis not present

## 2022-06-24 DIAGNOSIS — C719 Malignant neoplasm of brain, unspecified: Secondary | ICD-10-CM | POA: Diagnosis not present

## 2022-06-24 DIAGNOSIS — Z66 Do not resuscitate: Secondary | ICD-10-CM | POA: Diagnosis not present

## 2022-06-24 MED ORDER — LEVETIRACETAM 100 MG/ML PO SOLN
1500.0000 mg | Freq: Two times a day (BID) | ORAL | Status: DC
  Administered 2022-06-24 – 2022-06-26 (×4): 1500 mg via ORAL
  Filled 2022-06-24 (×7): qty 15

## 2022-06-24 MED ORDER — BISACODYL 10 MG RE SUPP
10.0000 mg | Freq: Once | RECTAL | Status: AC
  Administered 2022-06-24: 10 mg via RECTAL
  Filled 2022-06-24: qty 1

## 2022-06-24 MED ORDER — FLUCONAZOLE IN SODIUM CHLORIDE 400-0.9 MG/200ML-% IV SOLN
400.0000 mg | INTRAVENOUS | Status: DC
  Administered 2022-06-24 – 2022-06-27 (×4): 400 mg via INTRAVENOUS
  Filled 2022-06-24 (×4): qty 200

## 2022-06-24 MED ORDER — DEXAMETHASONE 4 MG PO TABS
4.0000 mg | ORAL_TABLET | Freq: Three times a day (TID) | ORAL | Status: DC
  Administered 2022-06-24 – 2022-06-27 (×8): 4 mg via ORAL
  Filled 2022-06-24 (×9): qty 1

## 2022-06-24 NOTE — Progress Notes (Signed)
Physical Therapy Treatment Patient Details Name: Paula Farmer MRN: [id number] DOB: 1980/05/25 Today's Date: 06/24/2022   History of Present Illness 42 year old female with history of anaplastic astrocytoma with progression, initially diagnosed in 2018 ,who presented with syncopal event (likely seizure per MD note) from home on 06/16/22.    PT Comments    Pt with some confusion and short term memory deficits but agreeable to therapy.  She required mod A of 2 to get to EOB and then immediately with syncopal episode and had to return to supine.  BP was stable by then time BP retrieved but pt had laid down for at least a minute.  Assisted pt with positioning but further progress limited due to syncopal episode - notified RN.     Recommendations for follow up therapy are one component of a multi-disciplinary discharge planning process, led by the attending physician.  Recommendations may be updated based on patient status, additional functional criteria and insurance authorization.  Follow Up Recommendations       Assistance Recommended at Discharge Frequent or constant Supervision/Assistance  Patient can return home with the following Assist for transportation;Help with stairs or ramp for entrance;Two people to help with walking and/or transfers;Two people to help with bathing/dressing/bathroom   Equipment Recommendations  None recommended by PT    Recommendations for Other Services       Precautions / Restrictions Precautions Precautions: Fall Precaution Comments: left hemiparesis, left visual deficits     Mobility  Bed Mobility Overal bed mobility: Needs Assistance Bed Mobility: Supine to Sit, Sit to Supine     Supine to sit: Mod assist, +2 for physical assistance Sit to supine: Total assist, +2 for physical assistance   General bed mobility comments: Pt requiring assist for L LE and to lift trunk.  Increased time and cues for sequencing required.  Pt with syncopal  episode at EOB immediately upon sitting closing eyes and head back.  Verbal cues to pt and she opened eyes, stated she didn't feel well, and then slumped over with syncopal episode.  Pt requiring total A x 2 to return to supine and immediatley became alert. Retrieved dynamap and BP was 130/70 (but pt had been back to bed for at least 1 minute).  Elevated to chair position and was 128/73.  Notified RN of episode    Transfers                   General transfer comment: unable/syncopal    Ambulation/Gait                   Stairs             Wheelchair Mobility    Modified Rankin (Stroke Patients Only)       Balance Overall balance assessment: Needs assistance, History of Falls Sitting-balance support: Single extremity supported, Feet supported Sitting balance-Leahy Scale: Poor Sitting balance - Comments: R UE support and min A but limited to only a few seconds due to syncope                                    Cognition Arousal/Alertness: Awake/alert Behavior During Therapy: Anxious Overall Cognitive Status: No family/caregiver present to determine baseline cognitive functioning Area of Impairment: Orientation, Problem solving, Attention, Memory, Following commands, Safety/judgement, Awareness                 Orientation Level: Situation, Time Current  Attention Level: Sustained Memory: Decreased short-term memory Following Commands: Follows one step commands inconsistently Safety/Judgement: Decreased awareness of deficits, Decreased awareness of safety Awareness: Emergent Problem Solving: Slow processing, Decreased initiation, Difficulty sequencing, Requires verbal cues, Requires tactile cues          Exercises Other Exercises Other Exercises: L UE very painful with any movement - positioned on pillow    General Comments        Pertinent Vitals/Pain Pain Assessment Pain Assessment: Faces Faces Pain Scale: Hurts even  more Pain Location: L shoulder Pain Descriptors / Indicators: Discomfort Pain Intervention(s): Limited activity within patient's tolerance, Monitored during session, Premedicated before session, Repositioned    Home Living                          Prior Function            PT Goals (current goals can now be found in the care plan section) Progress towards PT goals: Not progressing toward goals - comment (limited by medical status)    Frequency    Min 1X/week      PT Plan Current plan remains appropriate    Co-evaluation              AM-PAC PT "6 Clicks" Mobility   Outcome Measure  Help needed turning from your back to your side while in a flat bed without using bedrails?: Total Help needed moving from lying on your back to sitting on the side of a flat bed without using bedrails?: Total Help needed moving to and from a bed to a chair (including a wheelchair)?: Total Help needed standing up from a chair using your arms (e.g., wheelchair or bedside chair)?: Total Help needed to walk in hospital room?: Total Help needed climbing 3-5 steps with a railing? : Total 6 Click Score: 6    End of Session Equipment Utilized During Treatment: Gait belt Activity Tolerance: Treatment limited secondary to medical complications (Comment) Patient left: in bed;with call bell/phone within reach;with bed alarm set;with SCD's reapplied (heels floated) Nurse Communication: Mobility status;Other (comment) (syncopal episode) PT Visit Diagnosis: Unsteadiness on feet (R26.81);History of falling (Z91.81);Difficulty in walking, not elsewhere classified (R26.2);Other symptoms and signs involving the nervous system (R29.898)     Time: 1610-9604 PT Time Calculation (min) (ACUTE ONLY): 21 min  Charges:  $Therapeutic Activity: 8-22 mins                     Anise Salvo, PT Acute Rehab [facility] Re[facility] [phone]    Rayetta Humphrey 06/24/2022, 3:42 PM

## 2022-06-24 NOTE — Progress Notes (Signed)
Daily Progress Note   Patient Name: Paula Farmer       Date: 06/24/2022 DOB: 1980-03-01  Age: 42 y.o. MRN#: [id number] Attending Physician: Willeen Niece, MD Primary Care Physician: Center, [facility] Admit Date: 06/16/2022 Length of Stay: 6 days  Reason for Consultation/Follow-up: Establishing goals of care and symptom management  Subjective:   CC: Patient eating a snack when present this morning.  Following up regarding complex medical decision making and symptom management.   Subjective:  Reviewed EMR prior to presenting to bedside.  At time of EMR review, patient has received Ativan 1 mg tablet x 1 and oxycodone 15 mg x 1.  Patient continues to receive OxyContin 10 mg every 12 hours scheduled.  Presented to bedside to meet with patient.  Patient seen laying in bed.  Patient enjoying a snack after breakfast this morning.  Patient feels that her pain is doing all right at this time.  Patient's primary concern was her mouth.  When examining her mouth, patient appears to have thrush.  Noted would start her on fluconazole and decrease steroids.  Patient agreeing with this plan. Discussed care with bedside RN for updates.  Patient's mental status continues to wax and wane. Also discussed care with chaplain for emotional support.  Review of Systems Pain managed currently  Objective:   Vital Signs:  BP (!) 135/94 (BP Location: Right Arm)   Pulse 93   Temp 98.8 F (37.1 C) (Oral)   Resp 16   Ht 5' 5.98" (1.676 m)   Wt 68.1 kg   SpO2 100%   BMI 24.24 kg/m   Physical Exam: General: NAD, laying comfortably in bed, chronically ill-appearing Eyes: No drainage noted HENT: moist mucous membranes Cardiovascular: RRR Respiratory: no increased work of breathing noted, not in respiratory distress Abdomen: not distended Extremities: Weakness noted in all extremities especially left side Skin: no rashes or lesions on visible skin Neuro: increased confusion with  underlying disease process  Imaging:  I personally reviewed recent imaging.   Assessment & Plan:   Assessment: Patient is a 42 year old female with past medical history of recurrent anaplastic astrocytoma who was admitted on 06/16/2022 after near syncopal event at home. Patient is well-known to the palliative medicine service as has been seen during prior hospitalizations. Palliative medicine team consulted to assist with complex medical decision making and symptom management.   Recommendations/Plan: # Complex medical decision making/goals of care:   - Patient is not a candidate for any further cancer directed therapies as per oncology here and oncology at [facility].  Patient already agreed with pursuing comfort focused care. Family (friend-Ron, children, mother) stating they are unable to care for patient at home. Currently seeking long term care placement with hospice support.            -  Code Status: DNR Prognosis: weeks-months (<6 months)   # Symptom management:                -Neck/Back pain with headache, is setting of recurrent anaplastic astrocytoma                              -Continue topical lidocaine cream    -Continue OxyContin 10mg  q12hrs tonight   -Continue oxycodone 15mg  q4hrs prn   -Continue IV dilaudid 1mg  q2hrs prn for breakthrough pain    -Decrease dexamethasone to 4mg  TID today. Would recommend taper as able. Had been on 4mg  QID since 5/19.    -  Oral candidiasis   -Start IV fluconazole 400 mg every 24 hours.  Discussed with pharmacy and patient having more difficulty with taking pills.  Solution difficult to work with.  So we will continue with IV at this time.  Discontinue when appropriate.  # Psychosocial Support:                - Ron-friend, children, sister                - chaplain involved for support   # Discharge Planning:  Seeking long term care placement with addition of hospice of the [place]   Discussed with: RN,  patient   Thank you for allowing  the palliative care team to participate in the care Paula Farmer.  Alvester Morin, DO Palliative Care Provider PMT # [phone]  If patient remains symptomatic despite maximum doses, please call PMT at [phone] between 0700 and 1900. Outside of these hours, please call attending, as PMT does not have night coverage.  *Please note that this is a verbal dictation therefore any spelling or grammatical errors are due to the "[employer] One" system interpretation.

## 2022-06-24 NOTE — Progress Notes (Signed)
Chaplain continues to follow-up with [place] offering supportive presence and community.     06/24/22 1100  Spiritual Encounters  Type of Visit Follow up  Care provided to: Patient

## 2022-06-24 NOTE — Progress Notes (Addendum)
PROGRESS NOTE    Paula Farmer  WUJ:[id number] DOB: 1980/10/13 DOA: 06/16/2022 PCP: Center, [facility]   Brief Narrative:  This 42 year old unfortunate female with history of anaplastic astrocytoma with progression, initially diagnosed in 2018 ,who presented with syncopal event from home.  Also reported headache and photophobia.  Remains weak on the left side, impaired vision on the left side as well. Neuro-oncology was consulted after admission and was following.  She was declared to have progressive and refractory astrocytoma.  Her syncopal episode was considered as seizure.  Extensive goals of care discussed with family and patient.  Neuro-oncology recommended hospice as there is lack of meaningful life-prolonging interventions. Palliative care consulted, plan is to discharge her home with hospice whenever possible ( waiting for full arrangement/care taker at home).  TOC following.   Assessment & Plan:   Active Problems:   Anaplastic astrocytoma (HCC)   Focal seizures (HCC)   Brain tumor (HCC)   Cancer associated pain   Seizures (HCC)   DNR (do not resuscitate)   Need for emotional support   Neck pain   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(HCC)   Oral candidiasis  Syncope likely seizure:  Secondary to brain tumor.  High risk for falls.  Started on Keppra here at 1.5 mg twice daily.  Also on Decadron.   Progressive astrocytoma: She was following with Dr. Barbaraann Cao.  She has progression of disease.   She is hemiplegic on the left side,  also has left-sided hemianopia.   Oncology recommending palliative/hospice approach.  Tibsovo has been formally discontinued.   Cancer surgery pain:  Continue pain management, supportive care.   Palliative care following.  Continue bowel regimen.   Tobacco use: Continue nicotine patch.   Hypertension: Patient's blood pressure noted to be persistently high. Continue amlodipine 5 mg daily.   Goals of care: CODE STATUS is DNR.  Plan for  discharge to home with hospice. Her friend Ferne Reus was supposed to take care of her at home.  Very poor prognosis  Oral candidiasis: Continue IV fluconazole   DVT prophylaxis: SCDS Code Status: DNR Family Communication: No family at bed side. Disposition Plan:   Status is: Inpatient Remains inpatient appropriate because: Family not able to take care of the patient.  TOC working along with hospice of [employer] liaison to determine where patient can receive 24/7 support for hospice   Consultants:  Neurosurgery Palliative care  Procedures: None Antimicrobials:  Anti-infectives (From admission, onward)    Start     Dose/Rate Route Frequency Ordered Stop   06/24/22 1200  fluconazole (DIFLUCAN) IVPB 400 mg        400 mg 100 mL/hr over 120 Minutes Intravenous Every 24 hours 06/24/22 1124        Subjective: Patient was seen and examined at bedside.  Overnight events noted.   Patient appears alert but slightly confused.  Lies in bed , has left-sided hemiplegia. She is eager to go home but needs 24/7 caregiver.  Family is not able to provide 24/7 care.  Objective: Vitals:   06/23/22 1424 06/23/22 1426 06/23/22 2056 06/24/22 0521  BP: (!) 142/86  (!) 141/86 (!) 135/94  Pulse: (!) 114 98 92 93  Resp:   16 16  Temp: 98.3 F (36.8 C)  97.7 F (36.5 C) 98.8 F (37.1 C)  TempSrc: Oral  Oral Oral  SpO2: 99%  98% 100%  Weight:      Height:        Intake/Output Summary (Last 24 hours)  at 06/24/2022 1359 Last data filed at 06/24/2022 0900 Gross per 24 hour  Intake 480 ml  Output --  Net 480 ml   Filed Weights   06/16/22 2347  Weight: 68.1 kg    Examination:  General exam: Appears calm, comfortable, deconditioned, not in any distress. Respiratory system: CTA bilaterally. Respiratory effort normal.  RR 16 Cardiovascular system: S1 & S2 heard, RRR. No JVD, murmurs, rubs, gallops or clicks. Gastrointestinal system: Abdomen is soft, non tender, non distended, BS+ Central nervous  system: Alert and oriented x 3 .  Weakness on the left side. Extremities: No edema, no cyanosis, no clubbing. Skin: No rashes, lesions or ulcers Psychiatry: Judgement and insight appear normal. Mood & affect appropriate.     Data Reviewed: I have personally reviewed following labs and imaging studies  CBC: Recent Labs  Lab 06/22/22 0659  WBC 11.5*  HGB 14.1  HCT 43.3  MCV 94.7  PLT 207   Basic Metabolic Panel: Recent Labs  Lab 06/22/22 0659  NA 137  K 4.0  CL 100  CO2 24  GLUCOSE 148*  BUN 15  CREATININE 0.70  CALCIUM 9.1   GFR: Estimated Creatinine Clearance: 87.5 mL/min (by C-G formula based on SCr of 0.7 mg/dL). Liver Function Tests: No results for input(s): "AST", "ALT", "ALKPHOS", "BILITOT", "PROT", "ALBUMIN" in the last 168 hours.  No results for input(s): "LIPASE", "AMYLASE" in the last 168 hours. No results for input(s): "AMMONIA" in the last 168 hours. Coagulation Profile: No results for input(s): "INR", "PROTIME" in the last 168 hours. Cardiac Enzymes: No results for input(s): "CKTOTAL", "CKMB", "CKMBINDEX", "TROPONINI" in the last 168 hours. BNP (last 3 results) No results for input(s): "PROBNP" in the last 8760 hours. HbA1C: No results for input(s): "HGBA1C" in the last 72 hours. CBG: No results for input(s): "GLUCAP" in the last 168 hours.  Lipid Profile: No results for input(s): "CHOL", "HDL", "LDLCALC", "TRIG", "CHOLHDL", "LDLDIRECT" in the last 72 hours. Thyroid Function Tests: No results for input(s): "TSH", "T4TOTAL", "FREET4", "T3FREE", "THYROIDAB" in the last 72 hours. Anemia Panel: No results for input(s): "VITAMINB12", "FOLATE", "FERRITIN", "TIBC", "IRON", "RETICCTPCT" in the last 72 hours. Sepsis Labs: No results for input(s): "PROCALCITON", "LATICACIDVEN" in the last 168 hours.  No results found for this or any previous visit (from the past 240 hour(s)).   Radiology Studies: No results found.  Scheduled Meds:  amitriptyline  50  mg Oral QHS   amLODipine  5 mg Oral Daily   bisacodyl  10 mg Rectal Once   dexamethasone  4 mg Oral TID   levETIRAcetam  1,500 mg Oral BID   melatonin  3 mg Oral QHS   oxyCODONE  10 mg Oral Q12H   pantoprazole  40 mg Oral Daily   polyethylene glycol  17 g Oral Daily   senna  1 tablet Oral Daily   Continuous Infusions:  fluconazole (DIFLUCAN) IV 400 mg (06/24/22 1250)     LOS: 6 days    Time spent: 35 mins    Willeen Niece, MD [facility]   If 7PM-7AM, please contact night-coverage

## 2022-06-25 DIAGNOSIS — G9389 Other specified disorders of brain: Secondary | ICD-10-CM | POA: Diagnosis not present

## 2022-06-25 NOTE — Progress Notes (Signed)
PROGRESS NOTE    Paula Farmer  ZOX:[id number] DOB: [birth year] DOA: 06/16/2022 PCP: Center, [facility]   Brief Narrative:  This 42 year old unfortunate female with history of anaplastic astrocytoma with progression, initially diagnosed in 2018 ,who presented with syncopal event from home.  Also reported headache and photophobia.  Remains weak on the left side, impaired vision on the left side as well. Neuro-oncology was consulted after admission and was following.  She was declared to have progressive and refractory astrocytoma.  Her syncopal episode was considered as seizure.  Extensive goals of care discussed with family and patient.  Neuro-oncology recommended hospice as there is lack of meaningful life-prolonging interventions. Palliative care consulted, plan is to discharge her home with hospice whenever possible ( waiting for full arrangement/care taker at home).  TOC following.   Assessment & Plan:   Active Problems:   Anaplastic astrocytoma (HCC)   Focal seizures (HCC)   Brain tumor (HCC)   Cancer associated pain   Seizures (HCC)   DNR (do not resuscitate)   Need for emotional support   Neck pain   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(HCC)   Oral candidiasis  Syncope likely seizure:  Secondary to brain tumor.  High risk for falls.  Started on Keppra here at 1.5 mg twice daily.  Also on Decadron.   Progressive astrocytoma: She was following with Dr. Barbaraann Cao.  She has progression of disease.   She is hemiplegic on the left side,  also has left-sided hemianopia.   Oncology recommending palliative/hospice approach.  Tibsovo has been formally discontinued.   Cancer surgery pain:  Continue pain management, supportive care.   Palliative care following.  Continue bowel regimen.   Tobacco use: Continue nicotine patch.   Hypertension: Patient's blood pressure noted to be persistently high. Continue amlodipine 5 mg daily.   Goals of care: CODE STATUS is DNR.  Plan for  discharge to home with hospice. Her friend Ferne Reus was supposed to take care of her at home.  Very poor prognosis  Oral candidiasis: Continue IV fluconazole   DVT prophylaxis: SCDS Code Status: DNR Family Communication: No family at bed side. Disposition Plan:   Status is: Inpatient Remains inpatient appropriate because: Family not able to take care of the patient.  TOC working along with hospice of [employer] liaison to determine where patient can receive 24/7 support for hospice   Consultants:  Neurosurgery Palliative care  Procedures: None Antimicrobials:  Anti-infectives (From admission, onward)    Start     Dose/Rate Route Frequency Ordered Stop   06/24/22 1200  fluconazole (DIFLUCAN) IVPB 400 mg        400 mg 100 mL/hr over 120 Minutes Intravenous Every 24 hours 06/24/22 1124        Subjective: Patient was seen and examined at bedside.  Overnight events noted.   Patient appears arousable, slightly confused , lies in bed,   Has left-sided hemiplegia. She is eager to go home but needs 24/7 caregiver.  Family is not able to provide 24/7 care.  Objective: Vitals:   06/24/22 0521 06/24/22 1416 06/24/22 2111 06/25/22 0630  BP: (!) 135/94 (!) 137/91 (!) 148/86 (!) 134/91  Pulse: 93 98 86   Resp: 16 17 18 17   Temp: 98.8 F (37.1 C) 97.8 F (36.6 C) (!) 97.5 F (36.4 C) (!) 97.4 F (36.3 C)  TempSrc: Oral Oral Oral Oral  SpO2: 100% 97% 99% 99%  Weight:      Height:  Intake/Output Summary (Last 24 hours) at 06/25/2022 1405 Last data filed at 06/25/2022 1327 Gross per 24 hour  Intake 1178.33 ml  Output 1150 ml  Net 28.33 ml   Filed Weights   06/16/22 2347  Weight: 68.1 kg    Examination:  General exam: Appears calm, comfortable, deconditioned, not in any distress. Respiratory system: CTA bilaterally. Respiratory effort normal.  RR 16 Cardiovascular system: S1 & S2 heard, RRR. No JVD, murmurs, rubs, gallops or clicks. Gastrointestinal system: Abdomen is  soft, non tender, non distended, BS+ Central nervous system: Alert and oriented x 2, left-sided weakness. Extremities: No edema, no cyanosis, no clubbing. Skin: No rashes, lesions or ulcers Psychiatry: Mood & affect appropriate.     Data Reviewed: I have personally reviewed following labs and imaging studies  CBC: Recent Labs  Lab 06/22/22 0659  WBC 11.5*  HGB 14.1  HCT 43.3  MCV 94.7  PLT 207   Basic Metabolic Panel: Recent Labs  Lab 06/22/22 0659  NA 137  K 4.0  CL 100  CO2 24  GLUCOSE 148*  BUN 15  CREATININE 0.70  CALCIUM 9.1   GFR: Estimated Creatinine Clearance: 87.5 mL/min (by C-G formula based on SCr of 0.7 mg/dL). Liver Function Tests: No results for input(s): "AST", "ALT", "ALKPHOS", "BILITOT", "PROT", "ALBUMIN" in the last 168 hours.  No results for input(s): "LIPASE", "AMYLASE" in the last 168 hours. No results for input(s): "AMMONIA" in the last 168 hours. Coagulation Profile: No results for input(s): "INR", "PROTIME" in the last 168 hours. Cardiac Enzymes: No results for input(s): "CKTOTAL", "CKMB", "CKMBINDEX", "TROPONINI" in the last 168 hours. BNP (last 3 results) No results for input(s): "PROBNP" in the last 8760 hours. HbA1C: No results for input(s): "HGBA1C" in the last 72 hours. CBG: No results for input(s): "GLUCAP" in the last 168 hours.  Lipid Profile: No results for input(s): "CHOL", "HDL", "LDLCALC", "TRIG", "CHOLHDL", "LDLDIRECT" in the last 72 hours. Thyroid Function Tests: No results for input(s): "TSH", "T4TOTAL", "FREET4", "T3FREE", "THYROIDAB" in the last 72 hours. Anemia Panel: No results for input(s): "VITAMINB12", "FOLATE", "FERRITIN", "TIBC", "IRON", "RETICCTPCT" in the last 72 hours. Sepsis Labs: No results for input(s): "PROCALCITON", "LATICACIDVEN" in the last 168 hours.  No results found for this or any previous visit (from the past 240 hour(s)).   Radiology Studies: No results found.  Scheduled Meds:   amitriptyline  50 mg Oral QHS   amLODipine  5 mg Oral Daily   bisacodyl  10 mg Rectal Once   dexamethasone  4 mg Oral TID   levETIRAcetam  1,500 mg Oral BID   melatonin  3 mg Oral QHS   oxyCODONE  10 mg Oral Q12H   pantoprazole  40 mg Oral Daily   polyethylene glycol  17 g Oral Daily   senna  1 tablet Oral Daily   Continuous Infusions:  fluconazole (DIFLUCAN) IV 400 mg (06/25/22 1248)     LOS: 7 days    Time spent: 35 mins    Willeen Niece, MD [facility]   If 7PM-7AM, please contact night-coverage

## 2022-06-25 NOTE — TOC Progression Note (Signed)
Transition of Care [facility]) - Progression Note    Patient Details  Name: Toniann Cory MRN: [id number] Date of Birth: May 15, 1980  Transition of Care [facility] CM/SW Contact  Beckie Busing, RN Phone Number:[phone]  06/25/2022, 12:43 PM  Clinical Narrative:    TOC continues to follow. Currently there are approximately 28 facilities that have denied request for bed offer. [facility] is in the process of verifying medicaid benefits.    Expected Discharge Plan: Home/Self Care Barriers to Discharge: Continued Medical Work up  Expected Discharge Plan and Services       Living arrangements for the past 2 months: Apartment                                       Social Determinants of Health (SDOH) Interventions SDOH Screenings   Food Insecurity: Food Insecurity Present (06/17/2022)  Housing: Patient Declined (06/17/2022)  Transportation Needs: Unmet Transportation Needs (06/17/2022)  Utilities: At Risk (06/17/2022)  Tobacco Use: High Risk (06/16/2022)    Readmission Risk Interventions    04/28/2022    2:48 PM  Readmission Risk Prevention Plan  Post Dischage Appt Complete  Medication Screening Complete  Transportation Screening Complete

## 2022-06-26 DIAGNOSIS — C719 Malignant neoplasm of brain, unspecified: Secondary | ICD-10-CM | POA: Diagnosis not present

## 2022-06-26 DIAGNOSIS — G9389 Other specified disorders of brain: Secondary | ICD-10-CM | POA: Diagnosis not present

## 2022-06-26 DIAGNOSIS — R569 Unspecified convulsions: Secondary | ICD-10-CM | POA: Diagnosis not present

## 2022-06-26 MED ORDER — LEVETIRACETAM IN NACL 1500 MG/100ML IV SOLN
1500.0000 mg | Freq: Two times a day (BID) | INTRAVENOUS | Status: DC
  Administered 2022-06-26 – 2022-06-27 (×3): 1500 mg via INTRAVENOUS
  Filled 2022-06-26 (×4): qty 100

## 2022-06-26 NOTE — Progress Notes (Signed)
Daily Progress Note   Patient Name: Paula Farmer       Date: 06/26/2022 DOB: 11-01-1980  Age: 42 y.o. MRN#: [id number] Attending Physician: Glade Lloyd, MD Primary Care Physician: Center, [facility] Admit Date: 06/16/2022 Length of Stay: 8 days  Reason for Consultation/Follow-up: Establishing goals of care and symptom management  Subjective:   CC:    Following up regarding complex medical decision making and symptom management.   Subjective:  Medication history reviewed Chart reviewed Mental status continues to wax and wane appreciate chaplain support, appreciate multidisciplinary support    Review of Systems Pain managed currently  Objective:   Vital Signs:  BP (!) 136/96 (BP Location: Right Arm)   Pulse 85   Temp 97.8 F (36.6 C) (Oral)   Resp 18   Ht 5' 5.98" (1.676 m)   Wt 68.1 kg   SpO2 97%   BMI 24.24 kg/m   Physical Exam: General: NAD, laying comfortably in bed, chronically ill-appearing Eyes: No drainage noted HENT: moist mucous membranes Cardiovascular: RRR Respiratory: no increased work of breathing noted, not in respiratory distress Abdomen: not distended Extremities: Weakness noted in all extremities especially left side Skin: no rashes or lesions on visible skin Neuro: increased confusion with underlying disease process  Imaging:  I personally reviewed recent imaging.   Assessment & Plan:   Assessment: Patient is a 42 year old female with past medical history of recurrent anaplastic astrocytoma who was admitted on 06/16/2022 after near syncopal event at home. Patient is well-known to the palliative medicine service as has been seen during prior hospitalizations. Palliative medicine team consulted to assist with complex medical decision making and symptom management.   Recommendations/Plan: # Complex medical decision making/goals of care:   - Patient is not a candidate for any further cancer directed therapies as per  oncology here and oncology at [facility].  Patient already agreed with pursuing comfort focused care. Family (friend-Ron, children, mother) stating they are unable to care for patient at home. Currently seeking long term care placement with hospice support.            -  Code Status: DNR Prognosis: weeks-months (<6 months)   # Symptom management:                -Neck/Back pain with headache, is setting of recurrent anaplastic astrocytoma                              -Continue topical lidocaine cream    -Continue OxyContin 10mg  q12hrs tonight   -Continue oxycodone 15mg  q4hrs prn   -Continue IV dilaudid 1mg  q2hrs prn for breakthrough pain      dexamethasone to 4mg  TID  . Continue to taper as able.     - Oral candidiasis    IV fluconazole 400 mg every 24 hours. Started on 5-27.   # Psychosocial Support:                - Ron-friend, children, sister                - chaplain involved for support   # Discharge Planning:  Seeking long term care placement with addition of hospice of the [place]   Discussed with: RN,  patient   Thank you for allowing the palliative care team to participate in the care [name].   Mod MDM Rosalin Hawking, MD.  Palliative Care Provider PMT # [phone]  If patient remains  symptomatic despite maximum doses, please call PMT at [phone] between 0700 and 1900. Outside of these hours, please call attending, as PMT does not have night coverage.  *Please note that this is a verbal dictation therefore any spelling or grammatical errors are due to the "[employer] One" system interpretation.

## 2022-06-26 NOTE — Progress Notes (Signed)
PT Cancellation Note  Patient Details Name: Gretha Andriola MRN: [id number] DOB: 09/29/1980   Cancelled Treatment:    Reason Eval/Treat Not Completed: Fatigue/lethargy limiting ability to participate. Per RN, pt very sleepy today and having a hard time staying awake. RN reports pt unable to stay away to eat food. Will continue to follow.    Domenick Bookbinder PT, DPT 06/26/22, 2:33 PM

## 2022-06-26 NOTE — Progress Notes (Signed)
PROGRESS NOTE    Paula Farmer  ZOX:[id number] DOB: [birth year] DOA: 06/16/2022 PCP: Center, [facility]   Brief Narrative:  42 year old female with history of anaplastic astrocytoma with progression, initially diagnosed in 2018 presented with syncopal event at home along with headache and photophobia.  Neuro-oncology was consulted.  She was found to have progressive and refractory astrocytoma. Her syncopal episode was considered as seizure. Extensive goals of care discussed with family and patient. Neuro-oncology recommended hospice as there is lack of meaningful life-prolonging interventions. Palliative care consulted.  TOC following and looking for facility placement with hospice.  Assessment & Plan:   Possible seizure Syncope secondary to seizure -Most likely from brain tumor.  Currently on Keppra: Switch to IV today because of inconsistent oral intake and intermittent refusal of oral meds. 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 Barbaraann Cao recommended palliative/hospice approach -TOC following and looking for facility placement with hospice. -Plan of care following.  Continue current pain regimen.  Continue amitriptyline -Continue bowel regimen -Very poor prognosis  Oral candidiasis -Continue IV Diflucan  Hypertension -continue amlodipine  Tobacco use -Continue nicotine patch    DVT prophylaxis: SCDs Code Status: DNR Family Communication: None at bedside Disposition Plan: Status is: Inpatient Remains inpatient appropriate because: Of severe debility.  Need for facility placement with hospice  Consultants: Neuro oncology/palliative care  Procedures: None  Antimicrobials: None   Subjective: Patient seen and examined at bedside.  Wakes up slightly, hardly answers questions.  No seizures, vomiting reported.  Nursing staff reports that patient  intermittently refuses medications.  Objective: Vitals:   06/25/22 0630 06/25/22 1410 06/25/22 2012 06/26/22 0506  BP: (!) 134/91 133/87 (!) 144/86 121/80  Pulse:  (!) 107 (!) 110 87  Resp: 17 16 16 14   Temp: (!) 97.4 F (36.3 C) 97.8 F (36.6 C) 98.7 F (37.1 C) 98.6 F (37 C)  TempSrc: Oral  Oral Oral  SpO2: 99% 95% 98% 95%  Weight:      Height:        Intake/Output Summary (Last 24 hours) at 06/26/2022 1151 Last data filed at 06/26/2022 0941 Gross per 24 hour  Intake 480 ml  Output 1550 ml  Net -1070 ml   Filed Weights   06/16/22 2347  Weight: 68.1 kg    Examination:  General exam: Looks chronically ill and deconditioned.  On room air.  Sleepy, wakes up slightly, hardly answers any questions.  Extremely slow to respond.  Left-sided weakness present Respiratory system: Bilateral decreased breath sounds at bases with scattered crackles Cardiovascular system: S1 & S2 heard, intermittently tachycardic Gastrointestinal system: Abdomen is nondistended, soft and nontender. Normal bowel sounds heard. Extremities: No cyanosis, clubbing, edema    Data Reviewed: I have personally reviewed following labs and imaging studies  CBC: Recent Labs  Lab 06/22/22 0659  WBC 11.5*  HGB 14.1  HCT 43.3  MCV 94.7  PLT 207   Basic Metabolic Panel: Recent Labs  Lab 06/22/22 0659  NA 137  K 4.0  CL 100  CO2 24  GLUCOSE 148*  BUN 15  CREATININE 0.70  CALCIUM 9.1   GFR: Estimated Creatinine Clearance: 87.5 mL/min (by C-G formula based on SCr of 0.7 mg/dL). Liver Function Tests: No results for input(s): "AST", "ALT", "ALKPHOS", "BILITOT", "PROT", "ALBUMIN" in the last 168 hours. No results for input(s): "LIPASE", "AMYLASE" in the last 168 hours. No results for input(s): "AMMONIA" in the last 168 hours. Coagulation Profile: No results  for input(s): "INR", "PROTIME" in the last 168 hours. Cardiac Enzymes: No results for input(s): "CKTOTAL", "CKMB", "CKMBINDEX", "TROPONINI"  in the last 168 hours. BNP (last 3 results) No results for input(s): "PROBNP" in the last 8760 hours. HbA1C: No results for input(s): "HGBA1C" in the last 72 hours. CBG: No results for input(s): "GLUCAP" in the last 168 hours. Lipid Profile: No results for input(s): "CHOL", "HDL", "LDLCALC", "TRIG", "CHOLHDL", "LDLDIRECT" in the last 72 hours. Thyroid Function Tests: No results for input(s): "TSH", "T4TOTAL", "FREET4", "T3FREE", "THYROIDAB" in the last 72 hours. Anemia Panel: No results for input(s): "VITAMINB12", "FOLATE", "FERRITIN", "TIBC", "IRON", "RETICCTPCT" in the last 72 hours. Sepsis Labs: No results for input(s): "PROCALCITON", "LATICACIDVEN" in the last 168 hours.  No results found for this or any previous visit (from the past 240 hour(s)).       Radiology Studies: No results found.      Scheduled Meds:  amitriptyline  50 mg Oral QHS   amLODipine  5 mg Oral Daily   bisacodyl  10 mg Rectal Once   dexamethasone  4 mg Oral TID   melatonin  3 mg Oral QHS   oxyCODONE  10 mg Oral Q12H   pantoprazole  40 mg Oral Daily   polyethylene glycol  17 g Oral Daily   senna  1 tablet Oral Daily   Continuous Infusions:  fluconazole (DIFLUCAN) IV Stopped (06/25/22 1449)   levETIRAcetam 1,500 mg (06/26/22 1100)          Glade Lloyd, MD [facility] 06/26/2022, 11:51 AM

## 2022-06-27 DIAGNOSIS — G9389 Other specified disorders of brain: Secondary | ICD-10-CM | POA: Diagnosis not present

## 2022-06-27 MED ORDER — HYDROMORPHONE HCL 1 MG/ML IJ SOLN: 1.0000 mg | INTRAMUSCULAR | Status: AC | PRN

## 2022-06-27 MED ORDER — DEXAMETHASONE 4 MG PO TABS: 4.0000 mg | ORAL_TABLET | Freq: Three times a day (TID) | ORAL | Status: AC

## 2022-06-27 MED ORDER — LEVETIRACETAM IN NACL 1500 MG/100ML IV SOLN: 1500.0000 mg | Freq: Two times a day (BID) | INTRAVENOUS | Status: AC

## 2022-06-27 MED ORDER — OXYCODONE HCL ER 10 MG PO T12A: 10.0000 mg | EXTENDED_RELEASE_TABLET | Freq: Two times a day (BID) | ORAL | Status: AC

## 2022-06-27 MED ORDER — MELATONIN 3 MG PO TABS: 3.0000 mg | ORAL_TABLET | Freq: Every day | ORAL | Status: AC

## 2022-06-27 NOTE — Progress Notes (Signed)
Daily Progress Note   Patient Name: Paula Farmer       Date: 06/27/2022 DOB: 1980-09-08  Age: 42 y.o. MRN#: [id number] Attending Physician: Glade Lloyd, MD Primary Care Physician: Center, [facility] Admit Date: 06/16/2022 Length of Stay: 9 days  Reason for Consultation/Follow-up: Establishing goals of care and symptom management  Subjective:   CC:    Following up regarding complex medical decision making and symptom management.   Subjective: Mostly lethargic, does not open eyes, does not look at this writer, mumbles some words, breakfast tray unatouched Medication history reviewed Chart reviewed Mental status continues to wax and wane appreciate chaplain support, appreciate multidisciplinary support    Review of Systems Pain managed currently  Objective:   Vital Signs:  BP (!) 129/90 (BP Location: Right Arm)   Pulse 74   Temp 97.7 F (36.5 C) (Oral)   Resp 14   Ht 5' 5.98" (1.676 m)   Wt 68.1 kg   SpO2 100%   BMI 24.24 kg/m   Physical Exam: General: NAD, laying comfortably in bed, chronically ill-appearing Eyes: No drainage noted HENT: moist mucous membranes Cardiovascular: RRR Respiratory: no increased work of breathing noted, not in respiratory distress Abdomen: not distended Extremities: Weakness noted in all extremities especially left side Skin: no rashes or lesions on visible skin Neuro: increased confusion with underlying disease process  Imaging:  I personally reviewed recent imaging.   Assessment & Plan:   Assessment: Patient is a 42 year old female with past medical history of recurrent anaplastic astrocytoma who was admitted on 06/16/2022 after near syncopal event at home. Patient is well-known to the palliative medicine service as has been seen during prior hospitalizations. Palliative medicine team consulted to assist with complex medical decision making and symptom management.   Recommendations/Plan: # Complex medical  decision making/goals of care:   - Patient is not a candidate for any further cancer directed therapies as per oncology here and oncology at [facility].  Patient already agreed with pursuing comfort focused care. Family (friend-Ron, children, mother) stating they are unable to care for patient at home.  Continues to have ongoing functional status decline and cognitive decline            -  Code Status: DNR Prognosis: To be markedly limited with regards to patient's ongoing decline, could be as short as 2 weeks in my opinion.   # Symptom management:                -Neck/Back pain with headache, is setting of recurrent anaplastic astrocytoma                              -Continue topical lidocaine cream    -Continue OxyContin 10mg  q12hrs tonight   -Continue oxycodone 15mg  q4hrs prn   -Continue IV dilaudid 1mg  q2hrs prn for breakthrough pain      dexamethasone to 4mg  TID  . Continue to taper as able.    is also on IV Keppra  - Oral candidiasis    IV fluconazole 400 mg every 24 hours. Started on 5-27.   # Psychosocial Support:                - Ron-friend, children, sister                - chaplain involved for support   # Discharge Planning: To be determined, is being followed by [facility] [place]  Discussed with:  Interdisciplinary team  Thank you for allowing the palliative care team to participate in the care [facility].   Mod MDM Rosalin Hawking, MD.  Palliative Care Provider PMT # [phone]  If patient remains symptomatic despite maximum doses, please call PMT at [phone] between 0700 and 1900. Outside of these hours, please call attending, as PMT does not have night coverage.  *Please note that this is a verbal dictation therefore any spelling or grammatical errors are due to the "[employer]agon [employer] One" system interpretation.

## 2022-06-27 NOTE — Progress Notes (Signed)
   I received this referral last week for hospice care at home. It was determined home was not a safe d/c plan due to no full time 24/7 caregiver. So the plan was to look for Skilled facility for pt and hospice follow pt at the facility. I have been following in the hospital for d/c and decline. It was noticed that over the past few days pt has been declining and with the changes that have been seen I discussed with our MD to see if the pt would meet the criteria for our Hospice facility. After discussion with our MD Dr. Hazel Sams- she does agree that pt would be appropriate for our facility. I have spoken to 2/3 adult Children Ebonii, Brashear, the pt's sitser Marquitta and the pt's mother Dwana Curd to discuss comfort care and the pt transitioning to our hospice facility. There is ne other adult child Tianna and she was not able to be on the conference call due to working hours. But the pt's mother Dwana Curd reports that she is in agreement with the plan. They confirm pt is DNR and are in agreement with this. I discussed with PC MD and attending MD who also agree pt is inpatient appropriate for comfort care. The daughter Demetrios Loll will sign paperwork for her mother since the pt is not felt to be able to.   We do have a bed to offer today and we will be able to accept the pt after paperwork completed. Norm Parcel RN [phone]

## 2022-06-27 NOTE — Progress Notes (Signed)
PROGRESS NOTE    Paula Farmer  ZOX:[id number] DOB: February 26, 1980 DOA: 06/16/2022 PCP: Center, [facility]   Brief Narrative:  42 year old female with history of anaplastic astrocytoma with progression, initially diagnosed in 2018 presented with syncopal event at home along with headache and photophobia.  Neuro-oncology was consulted.  She was found to have progressive and refractory astrocytoma. Her syncopal episode was considered as seizure. Extensive goals of care discussed with family and patient. Neuro-oncology recommended hospice as there is lack of meaningful life-prolonging interventions. Palliative care consulted.  TOC following and looking for facility placement with hospice.  Assessment & Plan:   Possible seizure Syncope secondary to seizure -Most likely from brain tumor.  Currently on iv Keppra: Switched to IV on 06/26/2022 because of inconsistent oral intake and intermittent refusal of oral meds. 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 Barbaraann Cao recommended palliative/hospice approach -TOC following and looking for facility placement with hospice. -Palliative care following.  Continue current pain regimen.  Continue amitriptyline -Continue bowel regimen -Very poor prognosis  Oral candidiasis -Continue IV Diflucan  Hypertension -continue amlodipine  Tobacco use -Continue nicotine patch    DVT prophylaxis: SCDs Code Status: DNR Family Communication: None at bedside Disposition Plan: Status is: Inpatient Remains inpatient appropriate because: Of severity of illness.  Need for facility placement with hospice  Consultants: Neuro oncology/palliative care  Procedures: None  Antimicrobials: None   Subjective: Patient seen and examined at bedside.  No agitation, seizures or vomiting reported. Objective: Vitals:   06/26/22 0506 06/26/22 1355  06/26/22 2040 06/27/22 0442  BP: 121/80 (!) 136/96 (!) 134/93 (!) 129/90  Pulse: 87 85 89 74  Resp: 14 18 16 14   Temp: 98.6 F (37 C) 97.8 F (36.6 C) 98.2 F (36.8 C) 97.7 F (36.5 C)  TempSrc: Oral Oral Oral Oral  SpO2: 95% 97% 100% 100%  Weight:      Height:        Intake/Output Summary (Last 24 hours) at 06/27/2022 0754 Last data filed at 06/27/2022 0600 Gross per 24 hour  Intake 720 ml  Output 1300 ml  Net -580 ml    Filed Weights   06/16/22 2347  Weight: 68.1 kg    Examination:  General exam: Looks chronically ill and deconditioned.  Currently on room air.  No distress.  Very poor historian.  Hardly answers any questions.  Extremely slow to respond.  Left-sided weakness present Respiratory system: Decreased breath sounds at bases bilaterally, no wheezing cardiovascular system: Rate mostly controlled; S1 and S2 are heard Gastrointestinal system: Abdomen is distended; soft and nontender. bowel sounds heard. Extremities: Trace lower extremity edema; no clubbing  Data Reviewed: I have personally reviewed following labs and imaging studies  CBC: Recent Labs  Lab 06/22/22 0659  WBC 11.5*  HGB 14.1  HCT 43.3  MCV 94.7  PLT 207    Basic Metabolic Panel: Recent Labs  Lab 06/22/22 0659  NA 137  K 4.0  CL 100  CO2 24  GLUCOSE 148*  BUN 15  CREATININE 0.70  CALCIUM 9.1    GFR: Estimated Creatinine Clearance: 87.5 mL/min (by C-G formula based on SCr of 0.7 mg/dL). Liver Function Tests: No results for input(s): "AST", "ALT", "ALKPHOS", "BILITOT", "PROT", "ALBUMIN" in the last 168 hours. No results for input(s): "LIPASE", "AMYLASE" in the last 168 hours. No results for input(s): "AMMONIA" in the last 168 hours. Coagulation Profile: No results for input(s): "INR", "PROTIME" in the last 168  hours. Cardiac Enzymes: No results for input(s): "CKTOTAL", "CKMB", "CKMBINDEX", "TROPONINI" in the last 168 hours. BNP (last 3 results) No results for input(s):  "PROBNP" in the last 8760 hours. HbA1C: No results for input(s): "HGBA1C" in the last 72 hours. CBG: No results for input(s): "GLUCAP" in the last 168 hours. Lipid Profile: No results for input(s): "CHOL", "HDL", "LDLCALC", "TRIG", "CHOLHDL", "LDLDIRECT" in the last 72 hours. Thyroid Function Tests: No results for input(s): "TSH", "T4TOTAL", "FREET4", "T3FREE", "THYROIDAB" in the last 72 hours. Anemia Panel: No results for input(s): "VITAMINB12", "FOLATE", "FERRITIN", "TIBC", "IRON", "RETICCTPCT" in the last 72 hours. Sepsis Labs: No results for input(s): "PROCALCITON", "LATICACIDVEN" in the last 168 hours.  No results found for this or any previous visit (from the past 240 hour(s)).       Radiology Studies: No results found.      Scheduled Meds:  amitriptyline  50 mg Oral QHS   amLODipine  5 mg Oral Daily   bisacodyl  10 mg Rectal Once   dexamethasone  4 mg Oral TID   melatonin  3 mg Oral QHS   oxyCODONE  10 mg Oral Q12H   pantoprazole  40 mg Oral Daily   polyethylene glycol  17 g Oral Daily   senna  1 tablet Oral Daily   Continuous Infusions:  fluconazole (DIFLUCAN) IV 400 mg (06/26/22 1219)   levETIRAcetam 1,500 mg (06/27/22 0113)          Glade Lloyd, MD [facility] 06/27/2022, 7:54 AM

## 2022-06-27 NOTE — Progress Notes (Signed)
Patient discharged to [facility] via [place], discharge report given to [employer] at [phone].

## 2022-06-27 NOTE — TOC Transition Note (Addendum)
Transition of Care [facility]) - CM/SW Discharge Note   Patient Details  Name: Paula Farmer MRN: [id number] Date of Birth: 02-09-1980  [facility]) CM/SW Contact:  Beckie Busing, RN Phone Number:[phone]  06/27/2022, 1:12 PM   Clinical Narrative:    Patient to discharge to residential hospice in [facility]. Family is in agreement and as been updated . D/c packet is at nurses station.         1540 CM received confirmation from Cherie with [facility] that patient is cleared to admit to facility. Transportation has been arranged per [facility]. No other needs noted TOC will sign off.                             Final next level of care: Hospice Medical Facility Barriers to Discharge: No Barriers Identified   Patient Goals and CMS Choice CMS Medicare.gov Compare Post Acute Care list provided to:: Patient Choice offered to / list presented to : Patient  Discharge Placement                  Patient to be transferred to facility by: PT[facility] Name of family member notified: Vera mother sister Faye Ramsay, daughter Eboni Patient and family notified of of transfer: 06/27/22  Discharge Plan and Services Additional resources added to the After Visit Summary for                  DME Arranged: N/A DME Agency: NA         HH Agency: NA        Social Determinants of Health (SDOH) Interventions SDOH Screenings   Food Insecurity: Food Insecurity Present (06/17/2022)  Housing: Patient Declined (06/17/2022)  Transportation Needs: Unmet Transportation Needs (06/17/2022)  Utilities: At Risk (06/17/2022)  Tobacco Use: High Risk (06/16/2022)     Readmission Risk Interventions    04/28/2022    2:48 PM  Readmission Risk Prevention Plan  Post Dischage Appt Complete  Medication Screening Complete  Transportation Screening Complete

## 2022-06-27 NOTE — Discharge Summary (Signed)
Physician Discharge Summary  Paula Farmer WJX:[id number] DOB: 12-31-1980 DOA: 06/16/2022  PCP: [facility]  Admit date: 06/16/2022 Discharge date: 06/27/2022  Admitted From: Home Disposition: Residential hospice  Recommendations for Outpatient Follow-up:  Follow up with residential hospice at earliest convenience   Home Health: No Equipment/Devices: None  Discharge Condition: Poor CODE STATUS: DNR  diet recommendation: Per comfort measures  Brief/Interim Summary: 42 year old female with history of anaplastic astrocytoma with progression, initially diagnosed in 2018 presented with syncopal event at home along with headache and photophobia.  Neuro-oncology was consulted.  She was found to have progressive and refractory astrocytoma. Her syncopal episode was considered as seizure. Extensive goals of care discussed with family and patient. Neuro-oncology recommended hospice as there is lack of meaningful life-prolonging interventions. Palliative care consulted.  Her condition has gradually worsened and patient's oral intake has worsened as well.  Family has agreed for residential hospice.  She will be discharged to residential hospice once bed is available.  Discharge Diagnoses:   Comfort measures only status Possible seizure Syncope secondary to seizure Progressive astrocytoma Cancer associated pain Oral candidiasis  Hypertension  Tobacco use   Plan -As discussed above, she will be discharged to residential hospice once bed is available.  Will continue IV Keppra on discharge.  Discharge Instructions   Allergies as of 06/27/2022   No Known Allergies      Medication List     STOP taking these medications    HM MULTIVITAMIN ADULT GUMMY PO   HYDROcodone-acetaminophen 10-325 MG tablet Commonly known as: NORCO   levETIRAcetam 1000 MG tablet Commonly known as: KEPPRA   methocarbamol 500 MG tablet Commonly known as: ROBAXIN   omeprazole 20  MG capsule Commonly known as: PRILOSEC   ondansetron 4 MG tablet Commonly known as: Zofran   potassium chloride SA 20 MEQ tablet Commonly known as: KLOR-CON M   senna 8.6 MG Tabs tablet Commonly known as: SENOKOT   Tibsovo 250 MG tablet Generic drug: ivosidenib       TAKE these medications    amitriptyline 50 MG tablet Commonly known as: ELAVIL TAKE 1 TABLET BY MOUTH EVERYDAY AT BEDTIME What changed:  how much to take how to take this when to take this additional instructions   dexamethasone 4 MG tablet Commonly known as: DECADRON Take 1 tablet (4 mg total) by mouth 3 (three) times daily. What changed: when to take this   HYDROmorphone 1 MG/ML injection Commonly known as: DILAUDID Inject 1 mL (1 mg total) into the vein every 2 (two) hours as needed for severe pain (dyspnea).   levETIRacetam 1500 MG/100ML Soln Commonly known as: KEPPRA Inject 100 mLs (1,500 mg total) into the vein every 12 (twelve) hours.   LORazepam 1 MG tablet Commonly known as: Ativan Take 1 tablet (1 mg total) by mouth 3 (three) times daily as needed for anxiety.   melatonin 3 MG Tabs tablet Take 1 tablet (3 mg total) by mouth at bedtime.   oxyCODONE 15 MG immediate release tablet Commonly known as: ROXICODONE Take 1 tablet (15 mg total) by mouth every 4 (four) hours as needed for moderate pain, severe pain or breakthrough pain. What changed: Another medication with the same name was added. Make sure you understand how and when to take each.   oxyCODONE 10 mg 12 hr tablet Commonly known as: OXYCONTIN Take 1 tablet (10 mg total) by mouth every 12 (twelve) hours. What changed: You were already taking a medication with the same name,  and this prescription was added. Make sure you understand how and when to take each.   polyethylene glycol 17 g packet Commonly known as: MIRALAX / GLYCOLAX Take 17 g by mouth daily.        No Known Allergies  Consultations: Neuro oncology/palliative  care    Procedures/Studies: DG CHEST PORT 1 VIEW  Result Date: 06/17/2022 CLINICAL DATA:  Near-syncope. EXAM: PORTABLE CHEST 1 VIEW COMPARISON:  Chest radiographs 02/12/2022 FINDINGS: Cardiac silhouette and mediastinal contours are within normal limits. The lungs are clear. No pleural effusion or pneumothorax. No acute skeletal abnormality. IMPRESSION: No acute cardiopulmonary disease process. Electronically Signed   By: Neita Garnet M.D.   On: 06/17/2022 10:10   CT Head Wo Contrast  Result Date: 06/16/2022 CLINICAL DATA:  Brain/CNS neoplasm. Monitor headache, sudden, severe. CT yesterday but returned with worsening headache. Syncope. EXAM: CT HEAD WITHOUT CONTRAST TECHNIQUE: Contiguous axial images were obtained from the base of the skull through the vertex without intravenous contrast. RADIATION DOSE REDUCTION: This exam was performed according to the departmental dose-optimization program which includes automated exposure control, adjustment of the mA and/or kV according to patient size and/or use of iterative reconstruction technique. 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 The hyperdensity could represent small volume intratumoral hemorrhage or mineralization and is not substantially changed. There is unchanged effacement of the right lateral ventricle and third ventricle. Stable ventricular caliber of the left lateral ventricle. Unchanged 9 mm of leftward midline shift at the septum pellucidum. Unchanged effacement of the suprasellar cistern. No new hemorrhage or evidence of acute infarct. Vascular: No hyperdense vessel. Skull: No acute fracture.  Right burr holes. Sinuses/Orbits: No acute finding. Other: None. IMPRESSION: 1. No substantial change in the right cerebral hemisphere neoplasm. 2. Unchanged effacement of the right lateral ventricle and third ventricle  with 9 mm of leftward midline shift. Stable ventricular caliber of the left lateral ventricle. 3. No new hemorrhage or evidence of acute infarct. Electronically Signed   By: Minerva Fester M.D.   On: 06/16/2022 19:38   CT HEAD WO CONTRAST ( )  Result Date: 06/15/2022 CLINICAL DATA:  Headache.  Known brain tumor.  Prior fall. EXAM: CT HEAD WITHOUT CONTRAST TECHNIQUE: Contiguous axial images were obtained from the base of the skull through the vertex without intravenous contrast. RADIATION DOSE REDUCTION: This exam was performed according to the departmental dose-optimization program which includes automated exposure control, adjustment of the mA and/or kV according to patient size and/or use of iterative reconstruction technique. COMPARISON:  CT head Jun 08, 2022. FINDINGS: Brain: No substantial change in large right cerebral hemisphere neoplasm with heterogeneous soft tissue and intermixed areas of hyperdensity which could represent small volume intratumoral hemorrhage or mineralization. Similar mass effect with approximately 9 mm of midline shift. Effacement of the suprasellar cistern, similar. No new/interval acute hemorrhage or evidence of acute large vascular territory infarct. Vascular: No hyperdense vessel identified. Skull: No acute fracture.  Burr holes. Sinuses/Orbits: Clear sinuses.  No acute orbital findings. Other: No mastoid effusions. IMPRESSION: No substantial change in large right cerebral hemisphere neoplasm with approximately 9 mm of leftward midline shift, described above. MRI with contrast could further assess if clinically warranted. Electronically Signed   By: Feliberto Harts M.D.   On: 06/15/2022 19:24   CT Lumbar Spine Wo Contrast  Result Date: 06/09/2022 CLINICAL DATA:  Trauma EXAM: CT LUMBAR SPINE WITHOUT CONTRAST TECHNIQUE: Multidetector CT  imaging of the lumbar spine was performed without intravenous contrast administration. Multiplanar CT image reconstructions were also  generated. RADIATION DOSE REDUCTION: This exam was performed according to the departmental dose-optimization program which includes automated exposure control, adjustment of the mA and/or kV according to patient size and/or use of iterative reconstruction technique. COMPARISON:  None Available. FINDINGS: Segmentation: 5 lumbar type vertebrae. Alignment: Normal. Vertebrae: No acute fracture or focal pathologic process. Paraspinal and other soft tissues: Negative. Disc levels: Small disc bulge at L5-S1. No stenosis. IMPRESSION: No acute fracture or traumatic subluxation of the lumbar spine. . Electronically Signed   By: Deatra Robinson M.D.   On: 06/09/2022 02:34   CT Head Wo Contrast  Result Date: 06/09/2022 CLINICAL DATA:  Trauma.  Intracranial neoplasm. EXAM: CT HEAD WITHOUT CONTRAST CT CERVICAL SPINE WITHOUT CONTRAST TECHNIQUE: Multidetector CT imaging of the head and cervical spine was performed following the standard protocol without intravenous contrast. Multiplanar CT image reconstructions of the cervical spine were also generated. RADIATION DOSE REDUCTION: This exam was performed according to the departmental dose-optimization program which includes automated exposure control, adjustment of the mA and/or kV according to patient size and/or use of iterative reconstruction technique. COMPARISON:  05/29/2022 head CT FINDINGS: CT HEAD FINDINGS Brain: Unchanged appearance of the right hemisphere with known neoplasm causing leftward midline shift of 9 mm. Unchanged areas of hyperdensity that may indicate mineralization. No acute hemorrhage or extra-axial collection. Vascular: No abnormal hyperdensity of the major intracranial arteries or dural venous sinuses. No intracranial atherosclerosis. Skull: Remote right burr hole. Sinuses/Orbits: No fluid levels or advanced mucosal thickening of the visualized paranasal sinuses. No mastoid or middle ear effusion. The orbits are normal. CT CERVICAL SPINE FINDINGS  Alignment: No static subluxation. Facets are aligned. Occipital condyles are normally positioned. Skull base and vertebrae: No acute fracture. Soft tissues and spinal canal: No prevertebral fluid or swelling. No visible canal hematoma. Disc levels: No advanced spinal canal or neural foraminal stenosis. Upper chest: No pneumothorax, pulmonary nodule or pleural effusion. Other: Normal visualized paraspinal cervical soft tissues. IMPRESSION: 1. Unchanged appearance of the right cerebral hemisphere with known neoplasm causing leftward midline shift of 9 mm. 2. No acute fracture or static subluxation of the cervical spine. Electronically Signed   By: Deatra Robinson M.D.   On: 06/09/2022 00:31   CT Cervical Spine Wo Contrast  Result Date: 06/09/2022 CLINICAL DATA:  Trauma.  Intracranial neoplasm. EXAM: CT HEAD WITHOUT CONTRAST CT CERVICAL SPINE WITHOUT CONTRAST TECHNIQUE: Multidetector CT imaging of the head and cervical spine was performed following the standard protocol without intravenous contrast. Multiplanar CT image reconstructions of the cervical spine were also generated. RADIATION DOSE REDUCTION: This exam was performed according to the departmental dose-optimization program which includes automated exposure control, adjustment of the mA and/or kV according to patient size and/or use of iterative reconstruction technique. COMPARISON:  05/29/2022 head CT FINDINGS: CT HEAD FINDINGS Brain: Unchanged appearance of the right hemisphere with known neoplasm causing leftward midline shift of 9 mm. Unchanged areas of hyperdensity that may indicate mineralization. No acute hemorrhage or extra-axial collection. Vascular: No abnormal hyperdensity of the major intracranial arteries or dural venous sinuses. No intracranial atherosclerosis. Skull: Remote right burr hole. Sinuses/Orbits: No fluid levels or advanced mucosal thickening of the visualized paranasal sinuses. No mastoid or middle ear effusion. The orbits are  normal. CT CERVICAL SPINE FINDINGS Alignment: No static subluxation. Facets are aligned. Occipital condyles are normally positioned. Skull base and vertebrae: No acute fracture. Soft tissues and  spinal canal: No prevertebral fluid or swelling. No visible canal hematoma. Disc levels: No advanced spinal canal or neural foraminal stenosis. Upper chest: No pneumothorax, pulmonary nodule or pleural effusion. Other: Normal visualized paraspinal cervical soft tissues. IMPRESSION: 1. Unchanged appearance of the right cerebral hemisphere with known neoplasm causing leftward midline shift of 9 mm. 2. No acute fracture or static subluxation of the cervical spine. Electronically Signed   By: Deatra Robinson M.D.   On: 06/09/2022 00:31   CT HEAD W & WO CONTRAST ( )  Result Date: 05/29/2022 CLINICAL DATA:  Brain/CNS neoplasm, monitor EXAM: CT HEAD WITHOUT AND WITH CONTRAST TECHNIQUE: Contiguous axial images were obtained from the base of the skull through the vertex without and with intravenous contrast. RADIATION DOSE REDUCTION: This exam was performed according to the departmental dose-optimization program which includes automated exposure control, adjustment of the mA and/or kV according to patient size and/or use of iterative reconstruction technique. CONTRAST:  75mL OMNIPAQUE IOHEXOL 300 MG/ML  SOLN COMPARISON:  CT March 29, 24. FINDINGS: Brain: Continued findings related to known astrocytoma in the right frontal lobe including masslike hypoattenuation with similar associated mass effect and 9 mm of leftward midline shift. Small area of hyperdensity within the lesion posterolaterally (series 2, image 15) appears new. Outside of this abnormality, no specific evidence of acute large vascular territory infarct. No hydrocephalus. Effacement the right lateral ventricle. Vascular: No hyperdense vessel identified. Calcific atherosclerosis. Skull: Right burr hole.  No acute fracture. Sinuses/Orbits: Clear sinuses.  No acute  orbital findings. Other: No mastoid effusions. IMPRESSION: Continued findings related to known astrocytoma in the right frontal lobe with similar mass effect and 9 mm of leftward midline shift. New small area of hyperdensity in this region is suspicious for either a small amount of intralesional hemorrhage or calcification. An MRI with contrast could better assess if clinically warranted. Electronically Signed   By: Feliberto Harts M.D.   On: 05/29/2022 15:07      Subjective: Patient seen and examined at bedside.  No agitation, seizures or vomiting reported.   Discharge Exam: Vitals:   06/26/22 2040 06/27/22 0442  BP: (!) 134/93 (!) 129/90  Pulse: 89 74  Resp: 16 14  Temp: 98.2 F (36.8 C) 97.7 F (36.5 C)  SpO2: 100% 100%   General exam: Looks chronically ill and deconditioned.  Currently on room air.  No distress.  Very poor historian.  Hardly answers any questions.  Extremely slow to respond.  Left-sided weakness present Respiratory system: Decreased breath sounds at bases bilaterally, no wheezing cardiovascular system: Rate mostly controlled; S1 and S2 are heard Gastrointestinal system: Abdomen is distended; soft and nontender. bowel sounds heard. Extremities: Trace lower extremity edema; no clubbing      The results of significant diagnostics from this hospitalization (including imaging, microbiology, ancillary and laboratory) are listed below for reference.     Microbiology: No results found for this or any previous visit (from the past 240 hour(s)).   Labs: BNP (last 3 results) No results for input(s): "BNP" in the last 8760 hours. Basic Metabolic Panel: Recent Labs  Lab 06/22/22 0659  NA 137  K 4.0  CL 100  CO2 24  GLUCOSE 148*  BUN 15  CREATININE 0.70  CALCIUM 9.1   Liver Function Tests: No results for input(s): "AST", "ALT", "ALKPHOS", "BILITOT", "PROT", "ALBUMIN" in the last 168 hours. No results for input(s): "LIPASE", "AMYLASE" in the last 168  hours. No results for input(s): "AMMONIA" in the last 168 hours. CBC:  Recent Labs  Lab 06/22/22 0659  WBC 11.5*  HGB 14.1  HCT 43.3  MCV 94.7  PLT 207   Cardiac Enzymes: No results for input(s): "CKTOTAL", "CKMB", "CKMBINDEX", "TROPONINI" in the last 168 hours. BNP: Invalid input(s): "POCBNP" CBG: No results for input(s): "GLUCAP" in the last 168 hours. D-Dimer No results for input(s): "DDIMER" in the last 72 hours. Hgb A1c No results for input(s): "HGBA1C" in the last 72 hours. Lipid Profile No results for input(s): "CHOL", "HDL", "LDLCALC", "TRIG", "CHOLHDL", "LDLDIRECT" in the last 72 hours. Thyroid function studies No results for input(s): "TSH", "T4TOTAL", "T3FREE", "THYROIDAB" in the last 72 hours.  Invalid input(s): "FREET3" Anemia work up No results for input(s): "VITAMINB12", "FOLATE", "FERRITIN", "TIBC", "IRON", "RETICCTPCT" in the last 72 hours. Urinalysis    Component Value Date/Time   COLORURINE YELLOW 06/17/2022 0600   APPEARANCEUR HAZY (A) 06/17/2022 0600   LABSPEC 1.017 06/17/2022 0600   PHURINE 5.0 06/17/2022 0600   GLUCOSEU NEGATIVE 06/17/2022 0600   HGBUR MODERATE (A) 06/17/2022 0600   BILIRUBINUR NEGATIVE 06/17/2022 0600   KETONESUR NEGATIVE 06/17/2022 0600   PROTEINUR NEGATIVE 06/17/2022 0600   NITRITE NEGATIVE 06/17/2022 0600   LEUKOCYTESUR MODERATE (A) 06/17/2022 0600   Sepsis Labs Recent Labs  Lab 06/22/22 0659  WBC 11.5*   Microbiology No results found for this or any previous visit (from the past 240 hour(s)).   Time coordinating discharge: 35 minutes  SIGNED:   Glade Lloyd, MD  [facility] 06/27/2022, 12:13 PM

## 2022-07-09 ENCOUNTER — Ambulatory Visit (HOSPITAL_COMMUNITY): Admission: RE | Admit: 2022-07-09 | Payer: No Typology Code available for payment source | Source: Ambulatory Visit

## 2022-07-09 ENCOUNTER — Ambulatory Visit (HOSPITAL_COMMUNITY): Admit: 2022-07-09 | Payer: No Typology Code available for payment source

## 2022-07-09 SURGERY — MRI WITH ANESTHESIA
Anesthesia: General

## 2022-07-11 ENCOUNTER — Ambulatory Visit: Payer: Medicaid Other | Admitting: Internal Medicine

## 2022-10-06 IMAGING — MR MR HEAD WO/W CM
13 series · 48 of 48 positions shown · IV contrast (gadavist)
Comparison: Head CT 03/14/2021, prior 8388 head CTs.  No prior MRI.

CLINICAL DATA: 40-year-old female with history of anaplastic
astrocytoma right hemisphere. Headache.

EXAM:
MRI HEAD WITHOUT AND WITH CONTRAST
TECHNIQUE: Multiplanar, multiecho pulse sequences of the brain and surrounding
structures were obtained without and with intravenous contrast.
CONTRAST:  6mL GADAVIST GADOBUTROL 1 MMOL/ML IV SOLN

[Series 5: DWI · axial · 3.0mm · 1.36mm/px · z∈[-53,+88]mm · 6 of 96 slices shown (1 of 2)]
[im 1/96]
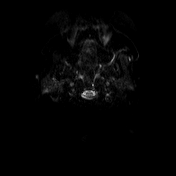
[im 20/96]
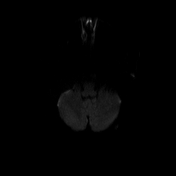
[im 39/96]
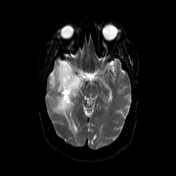
[im 58/96]
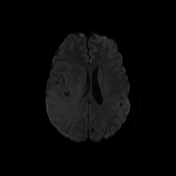
[im 77/96]
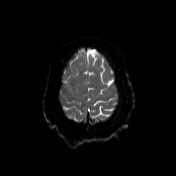
[im 96/96]
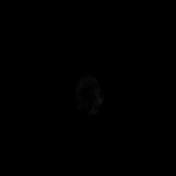

[Series 6: DWI · axial · 3.0mm · 1.36mm/px · z∈[-53,+88]mm · 3 of 48 slices shown (2 of 2)]
[im 1/48]
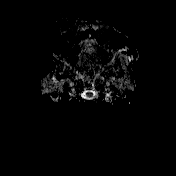
[im 24/48]
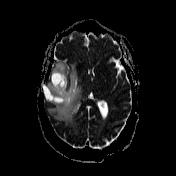
[im 48/48]
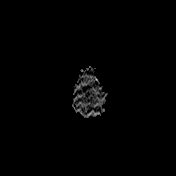

[Series 7: T1 · sagittal · 5.0mm · 0.75mm/px · 2 of 24 slices shown (1 of 2)]
[im 1/24]
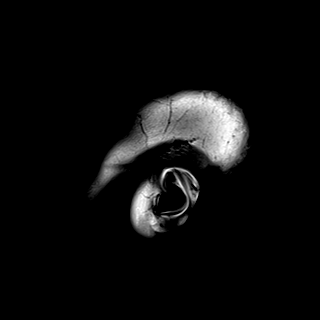
[im 24/24]
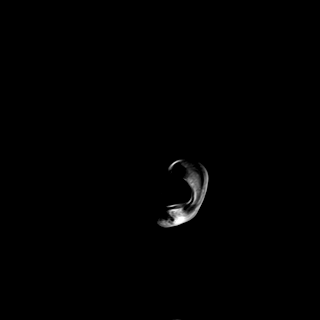

[Series 8: T2 · axial · 5.0mm · 0.62mm/px · z∈[-55,+94]mm · 2 of 24 slices shown]
[im 1/24]
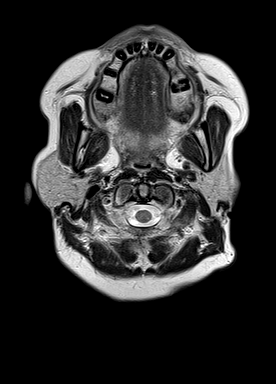
[im 24/24]
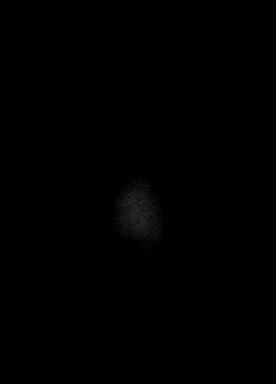

[Series 9: swi_images · axial · 3.0mm · 0.75mm/px · z∈[-51,+90]mm · 3 of 48 slices shown]
[im 1/48]
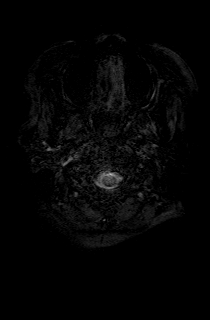
[im 24/48]
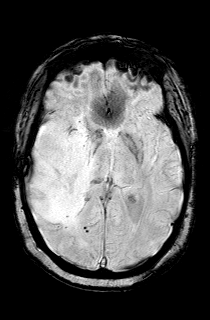
[im 48/48]
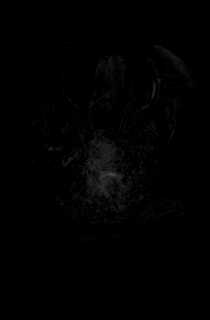

[Series 11: FLAIR · axial · 3.0mm · 0.75mm/px · z∈[-57,+96]mm · 3 of 52 slices shown]
[im 1/52]
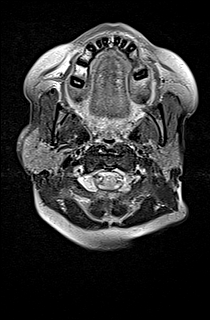
[im 26/52]
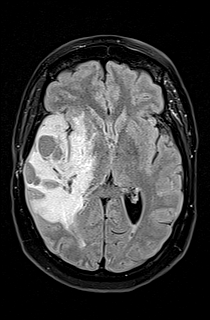
[im 52/52]
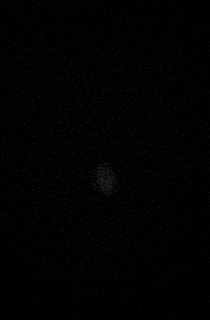

[Series 12: T1 · axial · 1.0mm · 0.94mm/px · z∈[-52,+91]mm · 9 of 144 slices shown (2 of 2)]
[im 1/144]
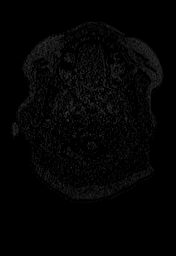
[im 18/144]
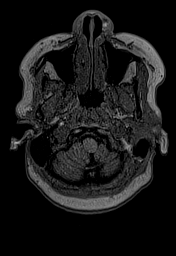
[im 36/144]
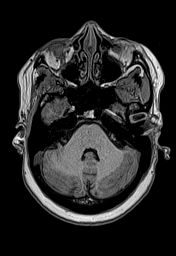
[im 54/144]
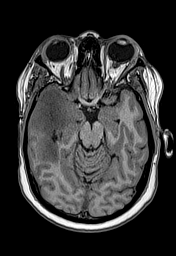
[im 72/144]
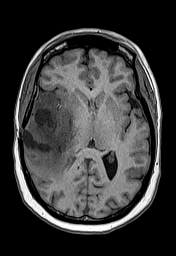
[im 90/144]
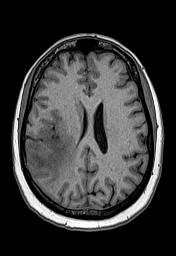
[im 108/144]
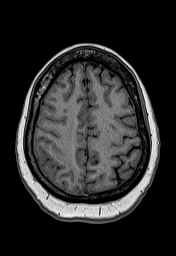
[im 126/144]
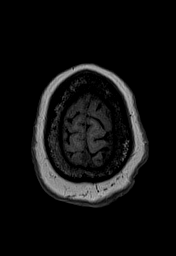
[im 144/144]
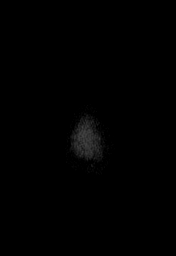

[Series 13: cor dwi_tracew · coronal · 5.0mm · 1.53mm/px · 3 of 54 slices shown]
[im 1/54]
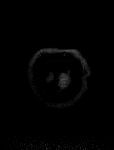
[im 27/54]
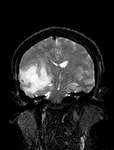
[im 54/54]
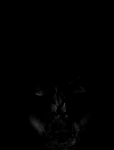

[Series 14: cor dwi_adc · coronal · 5.0mm · 1.53mm/px · 2 of 27 slices shown]
[im 1/27]
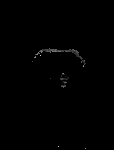
[im 27/27]
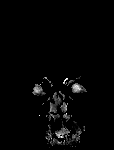

[Series 15: T2 post-contrast · coronal · 5.0mm · 0.57mm/px · 2 of 30 slices shown]
[im 1/30]
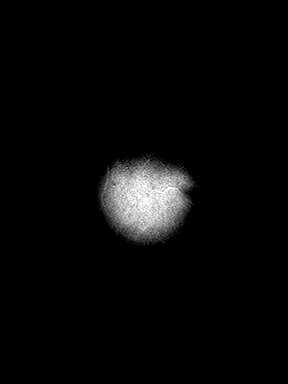
[im 30/30]
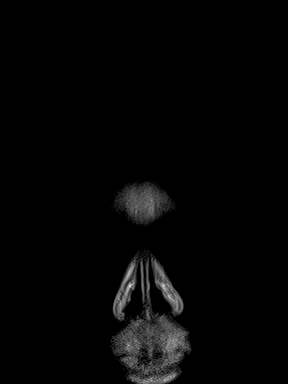

[Series 16: T1 post-contrast · axial · 1.0mm · 0.94mm/px · z∈[-52,+91]mm · 9 of 144 slices shown (1 of 3)]
[im 1/144]
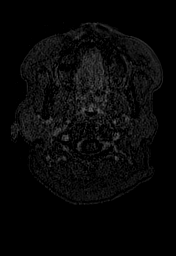
[im 18/144]
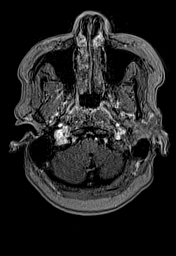
[im 36/144]
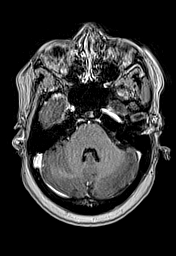
[im 54/144]
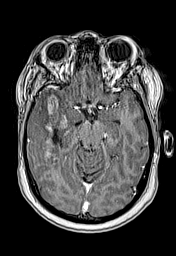
[im 72/144]
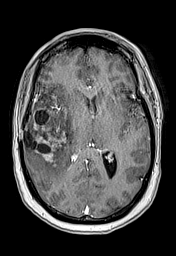
[im 90/144]
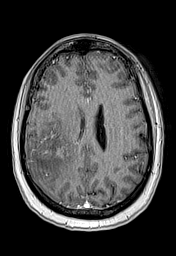
[im 108/144]
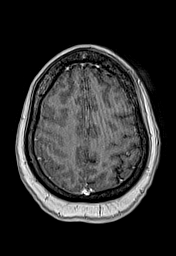
[im 126/144]
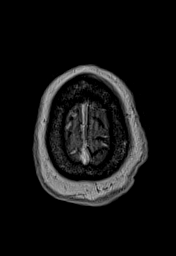
[im 144/144]
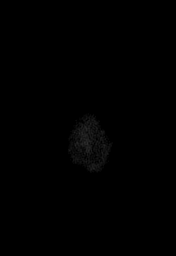

[Series 17: T1 post-contrast · coronal · 5.0mm · 0.43mm/px · 2 of 30 slices shown (2 of 3)]
[im 1/30]
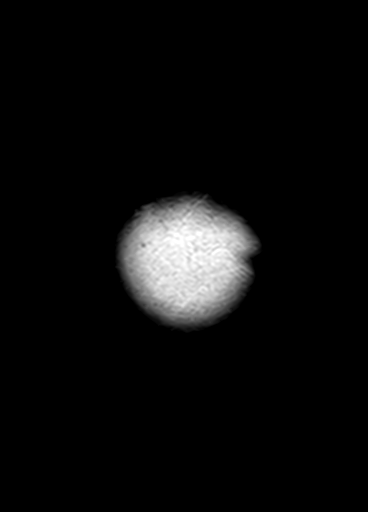
[im 30/30]
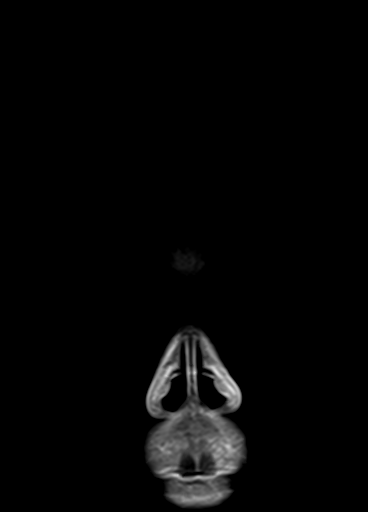

[Series 18: T1 post-contrast · sagittal · 5.0mm · 0.75mm/px · 2 of 24 slices shown (3 of 3)]
[im 1/24]
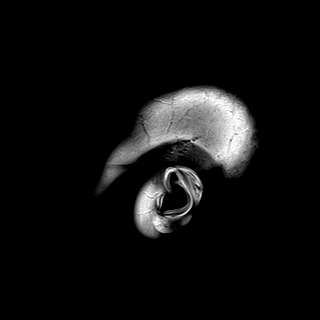
[im 24/24]
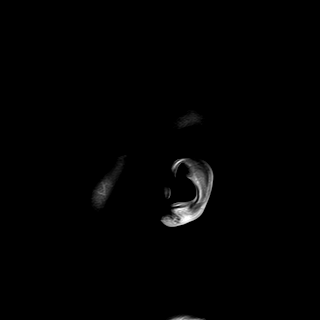

[48 of 48 positions shown; findings below may reference images not displayed]

FINDINGS: Brain: Noncontrast CT in October 2020 demonstrated abnormal
infiltrative hypodensity affecting the right hemisphere, especially
right temporal and parietal lobes, with right temporal region burr
hole probably biopsy related.

Confluent and masslike abnormal T2 and FLAIR hyperintensity in the
right hemisphere extends from the posteroinferior frontal gyrus
throughout the right external capsules, insula, right temporal lobe
and most of the right parietal lobe. Temporal operculum and
posterior right frontal operculum involved. Right occipital lobe
relatively spared although there is occipital horn periventricular
white matter involvement. All told, 89 x 49 x 59 mm (AP by
transverse by CC) of confluent right hemisphere involvement. Cystic
changes within the right temporal lobe. Diffusion is fairly
facilitated throughout. However, following contrast there is
extensive nodular, multi-cystic, and petechial enhancement centered
at the insula and temporal lobe involving an area of 69 by 42 by 43
mm (AP by transverse by CC). The abnormal enhancement tracks
slightly into the inferior deep gray nuclei on series 17, image 18.

Mass effect on the right lateral ventricle with 5 mm of midline
shift, not significantly changed since [REDACTED]. Although mild mass
effect on the right midbrain may have increased. Basilar cisterns
remain patent. No dural thickening or enhancement. Faint probable
biopsy related hemosiderin in the central right temporal lobe.

No contralateral left hemisphere or posterior fossa involvement,
although there is faint left periatrial white matter T2 and FLAIR
hyperintensity (series 11, image 33). No superimposed restricted
diffusion suggestive of acute infarction. No ventriculomegaly,
extra-axial collection or acute intracranial hemorrhage.
Cervicomedullary junction and pituitary are within normal limits.

Vascular: Major intracranial vascular flow voids are preserved. The
major dural venous sinuses are enhancing and appear to be patent.

Skull and upper cervical spine: Negative visible cervical spine and
spinal cord. Visualized bone marrow signal is within normal limits.

Sinuses/Orbits: Negative.

Other: Visible internal auditory structures appear normal. Negative
scalp.
IMPRESSION: 1. Large (nearly 9 cm long axis) infiltrative tumor in the right
hemisphere with epicenter at the right temporal lobe and insula.
Nearly 7 cm region of abnormal enhancement compatible with
high-grade tumor. Evidence of prior tumor biopsy.
2. Intracranial mass effect with 5 mm of midline shift similar to
that in [DATE]. No other acute intracranial abnormality.

## 2023-02-25 NOTE — Addendum Note (Signed)
Encounter addended by: Silvana Newness on: 02/25/2023 4:22 PM  Actions taken: Imaging Exam ended
